# Patient Record
Sex: Male | Born: 1945 | Race: White | Hispanic: No | Marital: Married | Smoking: Former smoker
Health system: Southern US, Community
[De-identification: ages and names within clinical notes are randomized; demographics above are authoritative.]

## PROBLEM LIST (undated history)

## (undated) DIAGNOSIS — D7581 Myelofibrosis: Secondary | ICD-10-CM

## (undated) DIAGNOSIS — F32A Depression, unspecified: Secondary | ICD-10-CM

## (undated) DIAGNOSIS — J449 Chronic obstructive pulmonary disease, unspecified: Secondary | ICD-10-CM

## (undated) DIAGNOSIS — F329 Major depressive disorder, single episode, unspecified: Secondary | ICD-10-CM

## (undated) DIAGNOSIS — C439 Malignant melanoma of skin, unspecified: Secondary | ICD-10-CM

## (undated) DIAGNOSIS — E785 Hyperlipidemia, unspecified: Secondary | ICD-10-CM

## (undated) DIAGNOSIS — I82409 Acute embolism and thrombosis of unspecified deep veins of unspecified lower extremity: Secondary | ICD-10-CM

## (undated) DIAGNOSIS — F419 Anxiety disorder, unspecified: Secondary | ICD-10-CM

## (undated) DIAGNOSIS — F4310 Post-traumatic stress disorder, unspecified: Secondary | ICD-10-CM

## (undated) HISTORY — DX: Malignant melanoma of skin, unspecified: C43.9

## (undated) HISTORY — PX: HERNIA REPAIR: SHX51

## (undated) HISTORY — DX: Hyperlipidemia, unspecified: E78.5

## (undated) HISTORY — DX: Anxiety disorder, unspecified: F41.9

## (undated) HISTORY — PX: MELANOMA EXCISION: SHX5266

## (undated) HISTORY — DX: Depression, unspecified: F32.A

## (undated) HISTORY — DX: Major depressive disorder, single episode, unspecified: F32.9

---

## 1998-04-12 ENCOUNTER — Emergency Department (HOSPITAL_COMMUNITY): Admission: EM | Admit: 1998-04-12 | Discharge: 1998-04-12 | Payer: Self-pay

## 1999-10-14 ENCOUNTER — Encounter: Admission: RE | Admit: 1999-10-14 | Discharge: 1999-10-14 | Payer: Self-pay | Admitting: *Deleted

## 1999-10-14 ENCOUNTER — Encounter: Payer: Self-pay | Admitting: *Deleted

## 2005-09-06 ENCOUNTER — Encounter: Admission: RE | Admit: 2005-09-06 | Discharge: 2005-09-06 | Payer: Self-pay | Admitting: Orthopedic Surgery

## 2008-10-19 ENCOUNTER — Emergency Department (HOSPITAL_COMMUNITY): Admission: EM | Admit: 2008-10-19 | Discharge: 2008-10-19 | Payer: Self-pay | Admitting: Emergency Medicine

## 2009-08-01 ENCOUNTER — Ambulatory Visit (HOSPITAL_BASED_OUTPATIENT_CLINIC_OR_DEPARTMENT_OTHER): Admission: RE | Admit: 2009-08-01 | Discharge: 2009-08-01 | Payer: Self-pay | Admitting: Urology

## 2010-07-07 LAB — POCT HEMOGLOBIN-HEMACUE: Hemoglobin: 13.1 g/dL (ref 13.0–17.0)

## 2016-08-21 ENCOUNTER — Encounter (HOSPITAL_COMMUNITY): Payer: Self-pay

## 2016-08-21 ENCOUNTER — Emergency Department (HOSPITAL_COMMUNITY): Payer: Medicare PPO

## 2016-08-21 ENCOUNTER — Emergency Department (HOSPITAL_COMMUNITY)
Admission: EM | Admit: 2016-08-21 | Discharge: 2016-08-21 | Disposition: A | Discharge status: Home / Self Care | Payer: Medicare PPO | Attending: Emergency Medicine | Admitting: Emergency Medicine

## 2016-08-21 DIAGNOSIS — J441 Chronic obstructive pulmonary disease with (acute) exacerbation: Secondary | ICD-10-CM | POA: Diagnosis not present

## 2016-08-21 DIAGNOSIS — Z7982 Long term (current) use of aspirin: Secondary | ICD-10-CM | POA: Insufficient documentation

## 2016-08-21 DIAGNOSIS — Z79899 Other long term (current) drug therapy: Secondary | ICD-10-CM | POA: Insufficient documentation

## 2016-08-21 DIAGNOSIS — R0602 Shortness of breath: Secondary | ICD-10-CM | POA: Diagnosis present

## 2016-08-21 LAB — CBC WITH DIFFERENTIAL/PLATELET
BASOS PCT: 2 %
BLASTS: 0 %
Band Neutrophils: 3 %
Basophils Absolute: 0.4 10*3/uL — ABNORMAL HIGH (ref 0.0–0.1)
Eosinophils Absolute: 0.2 10*3/uL (ref 0.0–0.7)
Eosinophils Relative: 1 %
HCT: 51.4 % (ref 39.0–52.0)
HEMOGLOBIN: 16.1 g/dL (ref 13.0–17.0)
LYMPHS PCT: 18 %
Lymphs Abs: 3.3 10*3/uL (ref 0.7–4.0)
MCH: 28.8 pg (ref 26.0–34.0)
MCHC: 31.3 g/dL (ref 30.0–36.0)
MCV: 91.9 fL (ref 78.0–100.0)
MONO ABS: 0.4 10*3/uL (ref 0.1–1.0)
MYELOCYTES: 8 %
Metamyelocytes Relative: 3 %
Monocytes Relative: 2 %
NEUTROS PCT: 63 %
NRBC: 2 /100{WBCs} — AB
Neutro Abs: 13.9 10*3/uL — ABNORMAL HIGH (ref 1.7–7.7)
OTHER: 0 %
PLATELETS: 115 10*3/uL — AB (ref 150–400)
PROMYELOCYTES ABS: 0 %
RBC: 5.59 MIL/uL (ref 4.22–5.81)
RDW: 19.5 % — ABNORMAL HIGH (ref 11.5–15.5)
WBC: 18.2 10*3/uL — AB (ref 4.0–10.5)

## 2016-08-21 LAB — BASIC METABOLIC PANEL
Anion gap: 5 (ref 5–15)
BUN: 16 mg/dL (ref 6–20)
CHLORIDE: 108 mmol/L (ref 101–111)
CO2: 26 mmol/L (ref 22–32)
CREATININE: 0.92 mg/dL (ref 0.61–1.24)
Calcium: 9 mg/dL (ref 8.9–10.3)
Glucose, Bld: 105 mg/dL — ABNORMAL HIGH (ref 65–99)
Potassium: 4.7 mmol/L (ref 3.5–5.1)
SODIUM: 139 mmol/L (ref 135–145)

## 2016-08-21 MED ORDER — ALBUTEROL SULFATE HFA 108 (90 BASE) MCG/ACT IN AERS
INHALATION_SPRAY | RESPIRATORY_TRACT | Status: AC
  Filled 2016-08-21: qty 6.7

## 2016-08-21 MED ORDER — DOXYCYCLINE HYCLATE 100 MG PO CAPS: 100.0000 mg | ORAL_CAPSULE | Freq: Two times a day (BID) | ORAL | 0 refills | Status: DC

## 2016-08-21 MED ORDER — ALBUTEROL SULFATE (2.5 MG/3ML) 0.083% IN NEBU: 2.5000 mg | INHALATION_SOLUTION | Freq: Once | RESPIRATORY_TRACT | Status: DC

## 2016-08-21 MED ORDER — PREDNISONE 20 MG PO TABS
60.0000 mg | ORAL_TABLET | Freq: Once | ORAL | Status: AC
  Administered 2016-08-21: 60 mg via ORAL
  Filled 2016-08-21: qty 3

## 2016-08-21 MED ORDER — ALBUTEROL SULFATE HFA 108 (90 BASE) MCG/ACT IN AERS
2.0000 | INHALATION_SPRAY | Freq: Four times a day (QID) | RESPIRATORY_TRACT | Status: DC
Start: 2016-08-21 — End: 2016-08-22
  Administered 2016-08-21: 2 via RESPIRATORY_TRACT

## 2016-08-21 MED ORDER — ALBUTEROL SULFATE (2.5 MG/3ML) 0.083% IN NEBU
5.0000 mg | INHALATION_SOLUTION | Freq: Once | RESPIRATORY_TRACT | Status: AC
  Administered 2016-08-21: 5 mg via RESPIRATORY_TRACT
  Filled 2016-08-21: qty 6

## 2016-08-21 MED ORDER — IPRATROPIUM BROMIDE HFA 17 MCG/ACT IN AERS: 2.0000 | INHALATION_SPRAY | Freq: Two times a day (BID) | RESPIRATORY_TRACT | 12 refills | Status: DC

## 2016-08-21 MED ORDER — PREDNISONE 20 MG PO TABS: 20.0000 mg | ORAL_TABLET | Freq: Two times a day (BID) | ORAL | 0 refills | Status: DC

## 2016-08-21 MED ORDER — ALBUTEROL SULFATE HFA 108 (90 BASE) MCG/ACT IN AERS: 1.0000 | INHALATION_SPRAY | Freq: Four times a day (QID) | RESPIRATORY_TRACT | 0 refills | Status: DC | PRN

## 2016-08-21 NOTE — Discharge Instructions (Signed)
Stop smoking! Follow-up with your physician at the [place].

## 2016-08-21 NOTE — ED Provider Notes (Signed)
[place] DEPT Provider Note   CSN: [id number] Arrival date & time: 08/21/16  1927     History   Chief Complaint Chief Complaint  Patient presents with  . Shortness of Breath    HPI Carlos Vaughn is a 71 y.o. male. Chief complaint: Short of breath.  HPI: This is a 71 year old male with a greater than 70+ pack year smoking history. States he's never been told he had emphysema. States is been short of breath for the last 2 days. Patient working in the yard was up on the roof cleaning off pollen today. No chest pain. No leg swelling. Minimal cough. No sputum no fever.  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 He is following with hematology at the [facility] in [place]. No intervention is planned as yet:  "they're monitoring"  No past medical history on file.  There are no active problems to display for this patient.   History reviewed. No pertinent surgical history.     Home Medications    Prior to Admission medications   Medication Sig Start Date End Date Taking? Authorizing Provider  aspirin EC 81 MG tablet Take 81 mg by mouth daily.   Yes [provider]  Multiple Vitamin (MULTIVITAMIN WITH MINERALS) TABS tablet Take 1 tablet by mouth daily.   Yes [provider]  PRAZOSIN HCL PO Take 1 tablet by mouth at bedtime.   Yes [provider]  QUETIAPINE FUMARATE PO Take 1 tablet by mouth at bedtime.   Yes [provider]  simvastatin (ZOCOR) 40 MG tablet Take 40 mg by mouth at bedtime.   Yes [provider]  albuterol (PROVENTIL HFA;VENTOLIN HFA) 108 (90 Base) MCG/ACT inhaler Inhale 1-2 puffs into the lungs every 6 (six) hours as needed for wheezing. 08/21/16   Tanna Furry, MD  doxycycline (VIBRAMYCIN) 100 MG capsule Take 1 capsule (100 mg total) by mouth 2 (two) times daily. 08/21/16   Tanna Furry, MD  ipratropium (ATROVENT HFA) 17 MCG/ACT inhaler Inhale 2 puffs into the lungs 2 (two) times daily. 08/21/16   Tanna Furry, MD  predniSONE (DELTASONE) 20 MG tablet Take 1 tablet (20 mg total) by mouth 2 (two) times daily with a meal. 08/21/16   Tanna Furry, MD    Family History No family history on file.  Social History Social History  Substance Use Topics  . Smoking status: Not on file  . Smokeless tobacco: Not on file  . Alcohol use Not on file     Allergies   Patient has no known allergies.   Review of Systems Review of Systems  Constitutional: Negative for appetite change, chills, diaphoresis, fatigue and fever.  HENT: Negative for mouth sores, sore throat and trouble swallowing.   Eyes: Negative for visual disturbance.  Respiratory: Positive for cough, shortness of breath and wheezing. Negative for chest tightness.   Cardiovascular: Negative for chest pain.  Gastrointestinal: Negative for abdominal distention, abdominal pain, diarrhea, nausea and vomiting.  Endocrine: Negative for polydipsia, polyphagia and polyuria.  Genitourinary: Negative for dysuria, frequency and hematuria.  Musculoskeletal: Negative for gait problem.  Skin: Negative for color change, pallor and rash.  Neurological: Negative for dizziness, syncope, light-headedness and headaches.  Hematological: Does not bruise/bleed easily.  Psychiatric/Behavioral: Negative for behavioral problems and confusion.     Physical Exam Updated Vital Signs BP 115/68 (BP Location: Right Arm)   Pulse 72   Temp 97.9 F (36.6 C) (Oral)   Resp 18   Ht 5\' 8"  (  1.727 m)   Wt 143 lb (64.9 kg)   SpO2 91%   BMI 21.74 kg/m   Physical Exam  Constitutional: He is oriented to person, place, and time. He appears well-developed and well-nourished. No distress.  HENT:  Head: Normocephalic.  Eyes: Conjunctivae are normal. Pupils are equal, round, and reactive to light. No scleral icterus.  Neck: Normal range of motion. Neck supple. No thyromegaly present.  Cardiovascular: Normal rate and regular rhythm.  Exam reveals no gallop and no friction  rub.   No murmur heard. Not tachycardic  Pulmonary/Chest: Effort normal. No respiratory distress. He has wheezes. He has no rales.  Abdominal: Soft. Bowel sounds are normal. He exhibits no distension. There is no tenderness. There is no rebound.  Musculoskeletal: Normal range of motion.  No LE edema or cording.  Neurological: He is alert and oriented to person, place, and time.  Skin: Skin is warm and dry. No rash noted.  Psychiatric: He has a normal mood and affect. His behavior is normal.     ED Treatments / Results  Labs (all labs ordered are listed, but only abnormal results are displayed) Labs Reviewed  CBC WITH DIFFERENTIAL/PLATELET - Abnormal; Notable for the following:       Result Value   WBC 18.2 (*)    RDW 19.5 (*)    Platelets 115 (*)    All other components within normal limits  BASIC METABOLIC PANEL - Abnormal; Notable for the following:    Glucose, Bld 105 (*)    All other components within normal limits    EKG  EKG Interpretation  Date/Time:  Saturday Aug 21 2016 19:34:11 EDT Ventricular Rate:  80 PR Interval:    QRS Duration: 89 QT Interval:  376 QTC Calculation: 434 R Axis:   65 Text Interpretation:  Sinus rhythm Probable left atrial enlargement Borderline low voltage, extremity leads Probable anteroseptal infarct, old Confirmed by Jeneen Rinks  MD, [place] ([id number]) on 08/21/2016 7:39:21 PM       Radiology Dg Chest 2 View  Result Date: 08/21/2016 CLINICAL DATA:  Shortness of breath EXAM: CHEST  2 VIEW COMPARISON:  Report 10/14/1999 FINDINGS: No acute infiltrate or effusion. There is hyperinflation. Normal heart size. No pneumothorax. There is mild kyphosis of the spine with mild wedging of mid thoracic vertebra. IMPRESSION: Hyperinflation.  No acute infiltrate or edema. Electronically Signed   By: Donavan Foil M.D.   On: 08/21/2016 19:55    Procedures Procedures (including critical care time)  Medications Ordered in ED Medications  albuterol (PROVENTIL) (2.5  MG/3ML) 0.083% nebulizer solution 2.5 mg (2.5 mg Nebulization Not Given 08/21/16 2040)  albuterol (PROVENTIL HFA;VENTOLIN HFA) 108 (90 Base) MCG/ACT inhaler 2 puff (not administered)  albuterol (PROVENTIL) (2.5 MG/3ML) 0.083% nebulizer solution 5 mg (5 mg Nebulization Given 08/21/16 2036)  predniSONE (DELTASONE) tablet 60 mg (60 mg Oral Given 08/21/16 2015)     Initial Impression / Assessment and Plan / ED Course  I have reviewed the triage vital signs and the nursing notes.  Pertinent labs & imaging results that were available during my care of the patient were reviewed by me and considered in my medical decision making (see chart for details).     Chest x-ray shows some COPD changes. No infiltrate thyromegaly or effusions. Nebulizer albuterol by mouth prednisone. His history of myelofibrosis we'll check blood count and kidney function. He is 96% on room air. He is not tachycardic. He does not have any other abnormalities suggestive of Pulmonary embolus. Clinically  and radiographically no pneumonia. Likely bronchospasm from undiagnosed illness COPD. Lab evaluation. Breathing treatment. Prednisone. Reevaluation.  Final Clinical Impressions(s) / ED Diagnoses   Final diagnoses:  COPD exacerbation ([place])   Patient feeling improved. Not anemic. Has leukocytosis. Differential not available the patient rather insistent that he must leave before results. Given prescription for up-year-old, Atrovent, prednisone, and doxycycline. .[facility] Sm[facility]   New Prescriptions New Prescriptions   ALBUTEROL (PROVENTIL HFA;VENTOLIN HFA) 108 (90 BASE) MCG/ACT INHALER    Inhale 1-2 puffs into the lungs every 6 (six) hours as needed for wheezing.   DOXYCYCLINE (VIBRAMYCIN) 100 MG CAPSULE    Take 1 capsule (100 mg total) by mouth 2 (two) times daily.   IPRATROPIUM (ATROVENT HFA) 17 MCG/ACT INHALER    Inhale 2 puffs into the lungs 2 (two) times daily.   PREDNISONE (DELTASONE) 20 MG TABLET    Take 1  tablet (20 mg total) by mouth 2 (two) times daily with a meal.     Tanna Furry, MD 08/21/16 2245

## 2016-08-21 NOTE — ED Notes (Signed)
Respiratory called for breathing treatment.

## 2016-08-21 NOTE — ED Notes (Signed)
Patient transported to X-ray 

## 2016-08-21 NOTE — ED Notes (Addendum)
Pt c/o shortness of breath since a week ago. Denies chest pain. Has not used any medicine for it. 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

## 2016-10-10 ENCOUNTER — Encounter (HOSPITAL_COMMUNITY): Payer: Self-pay | Admitting: Emergency Medicine

## 2016-10-10 ENCOUNTER — Emergency Department (HOSPITAL_COMMUNITY)
Admission: EM | Admit: 2016-10-10 | Discharge: 2016-10-10 | Disposition: A | Discharge status: Home / Self Care | Payer: Medicare PPO | Attending: Emergency Medicine | Admitting: Emergency Medicine

## 2016-10-10 ENCOUNTER — Emergency Department (HOSPITAL_COMMUNITY): Payer: Medicare PPO

## 2016-10-10 DIAGNOSIS — Z87891 Personal history of nicotine dependence: Secondary | ICD-10-CM | POA: Insufficient documentation

## 2016-10-10 DIAGNOSIS — J181 Lobar pneumonia, unspecified organism: Secondary | ICD-10-CM | POA: Insufficient documentation

## 2016-10-10 DIAGNOSIS — R05 Cough: Secondary | ICD-10-CM | POA: Diagnosis present

## 2016-10-10 DIAGNOSIS — J441 Chronic obstructive pulmonary disease with (acute) exacerbation: Secondary | ICD-10-CM | POA: Insufficient documentation

## 2016-10-10 DIAGNOSIS — Z7982 Long term (current) use of aspirin: Secondary | ICD-10-CM | POA: Diagnosis not present

## 2016-10-10 DIAGNOSIS — J189 Pneumonia, unspecified organism: Secondary | ICD-10-CM

## 2016-10-10 HISTORY — DX: Myelofibrosis: D75.81

## 2016-10-10 HISTORY — DX: Post-traumatic stress disorder, unspecified: F43.10

## 2016-10-10 HISTORY — DX: Chronic obstructive pulmonary disease, unspecified: J44.9

## 2016-10-10 LAB — CBC WITH DIFFERENTIAL/PLATELET
BASOS ABS: 0.2 10*3/uL — AB (ref 0.0–0.1)
Basophils Relative: 1 %
Eosinophils Absolute: 0 10*3/uL (ref 0.0–0.7)
Eosinophils Relative: 0 %
HCT: 45.5 % (ref 39.0–52.0)
Hemoglobin: 14.4 g/dL (ref 13.0–17.0)
LYMPHS ABS: 1.2 10*3/uL (ref 0.7–4.0)
Lymphocytes Relative: 5 %
MCH: 28.2 pg (ref 26.0–34.0)
MCHC: 31.6 g/dL (ref 30.0–36.0)
MCV: 89 fL (ref 78.0–100.0)
MONO ABS: 0.5 10*3/uL (ref 0.1–1.0)
Monocytes Relative: 2 %
Neutro Abs: 22.2 10*3/uL — ABNORMAL HIGH (ref 1.7–7.7)
Neutrophils Relative %: 92 %
Platelets: 211 10*3/uL (ref 150–400)
RBC: 5.11 MIL/uL (ref 4.22–5.81)
RDW: 18.9 % — AB (ref 11.5–15.5)
WBC Morphology: INCREASED
WBC: 24.1 10*3/uL — AB (ref 4.0–10.5)

## 2016-10-10 LAB — BASIC METABOLIC PANEL
Anion gap: 8 (ref 5–15)
BUN: 14 mg/dL (ref 6–20)
CALCIUM: 8.7 mg/dL — AB (ref 8.9–10.3)
CO2: 27 mmol/L (ref 22–32)
CREATININE: 0.95 mg/dL (ref 0.61–1.24)
Chloride: 101 mmol/L (ref 101–111)
GFR calc non Af Amer: >60 mL/min (ref >60)
Glucose, Bld: 99 mg/dL (ref 65–99)
Potassium: 4.4 mmol/L (ref 3.5–5.1)
SODIUM: 136 mmol/L (ref 135–145)

## 2016-10-10 MED ORDER — PREDNISONE 20 MG PO TABS
60.0000 mg | ORAL_TABLET | Freq: Once | ORAL | Status: AC
  Administered 2016-10-10: 60 mg via ORAL
  Filled 2016-10-10: qty 3

## 2016-10-10 MED ORDER — CEFPODOXIME PROXETIL 200 MG PO TABS
200.0000 mg | ORAL_TABLET | Freq: Once | ORAL | Status: AC
  Administered 2016-10-10: 200 mg via ORAL
  Filled 2016-10-10: qty 1

## 2016-10-10 MED ORDER — CEFPODOXIME PROXETIL 100 MG PO TABS: 100.0000 mg | ORAL_TABLET | Freq: Two times a day (BID) | ORAL | 0 refills | Status: AC

## 2016-10-10 MED ORDER — PREDNISONE 20 MG PO TABS: 40.0000 mg | ORAL_TABLET | Freq: Every day | ORAL | 0 refills | Status: AC

## 2016-10-10 MED ORDER — LEVOFLOXACIN 750 MG PO TABS
750.0000 mg | ORAL_TABLET | Freq: Once | ORAL | Status: DC
  Filled 2016-10-10: qty 1

## 2016-10-10 MED ORDER — HYDROCODONE-HOMATROPINE 5-1.5 MG/5ML PO SYRP
5.0000 mL | ORAL_SOLUTION | Freq: Four times a day (QID) | ORAL | 0 refills | Status: DC | PRN
Start: 2016-10-10 — End: 2017-09-05

## 2016-10-10 MED ORDER — DOXYCYCLINE HYCLATE 100 MG PO CAPS: 100.0000 mg | ORAL_CAPSULE | Freq: Two times a day (BID) | ORAL | 0 refills | Status: DC

## 2016-10-10 MED ORDER — HYDROCODONE-HOMATROPINE 5-1.5 MG/5ML PO SYRP
5.0000 mL | ORAL_SOLUTION | Freq: Once | ORAL | Status: AC
  Administered 2016-10-10: 5 mL via ORAL
  Filled 2016-10-10: qty 5

## 2016-10-10 MED ORDER — DOXYCYCLINE HYCLATE 100 MG PO TABS
100.0000 mg | ORAL_TABLET | Freq: Once | ORAL | Status: AC
  Administered 2016-10-10: 100 mg via ORAL
  Filled 2016-10-10: qty 1

## 2016-10-10 MED ORDER — IPRATROPIUM-ALBUTEROL 0.5-2.5 (3) MG/3ML IN SOLN
3.0000 mL | Freq: Once | RESPIRATORY_TRACT | Status: AC
  Administered 2016-10-10: 3 mL via RESPIRATORY_TRACT
  Filled 2016-10-10: qty 3

## 2016-10-10 NOTE — Discharge Instructions (Signed)
-   Start taking your antibiotics tomorrow morning - Drink at least 8 glasses of water (8 oz/glass) daily to stay hydrated - Use your ALBUTEROL inhaler four times a day for the next 2-3 days, then as needed for wheezing - Follow-up with your doctor in 1-2 weeks for repeat XRay and Exam

## 2016-10-10 NOTE — ED Notes (Signed)
Spouse will be driving home

## 2016-10-10 NOTE — ED Triage Notes (Addendum)
Pt reports a dry cough that is worse at night accompanied by night sweats and chills for the past week.  Diagnosed with COPD a month ago.

## 2016-10-10 NOTE — ED Provider Notes (Signed)
[place] [place] DEPT Provider Note   CSN: [id number] Arrival date & time: 10/10/16  1804     History   Chief Complaint Chief Complaint  Patient presents with  . Cough    HPI Carlos Vaughn is a 71 y.o. male.  HPI   71 yo M with h/o myelofibrosis, COPD here with cough, chills. Pt states that for the past week he has had progressively worsening cough, chills, and night sweats. He has had difficulty sleeping due to coughing and feeling hot chills. His appetite has also been poor. He states that he has also noticed mild SOB during the day. No chest pain. His cough has been dry. No known sick contacts. Of note, he did have a recent cold and was diagnosed with COPD 1.5 months ago.   Past Medical History:  Diagnosis Date  . COPD (chronic obstructive pulmonary disease) ([place])   . Myelofibrosis ([place])   . PTSD (post-traumatic stress disorder)     There are no active problems to display for this patient.   History reviewed. No pertinent surgical history.     Home Medications    Prior to Admission medications   Medication Sig Start Date End Date Taking? Authorizing Provider  albuterol (PROVENTIL HFA;VENTOLIN HFA) 108 (90 Base) MCG/ACT inhaler Inhale 1-2 puffs into the lungs every 6 (six) hours as needed for wheezing. 08/21/16  Yes Tanna Furry, MD  aspirin EC 81 MG tablet Take 81 mg by mouth daily.   Yes [provider]  ipratropium (ATROVENT HFA) 17 MCG/ACT inhaler Inhale 2 puffs into the lungs 2 (two) times daily. 08/21/16  Yes Tanna Furry, MD  Multiple Vitamin (MULTIVITAMIN WITH MINERALS) TABS tablet Take 1 tablet by mouth daily.   Yes [provider]  PRAZOSIN HCL PO Take 1 tablet by mouth at bedtime.   Yes [provider]  QUETIAPINE FUMARATE PO Take 1 tablet by mouth at bedtime.   Yes [provider]  simvastatin (ZOCOR) 40 MG tablet Take 40 mg by mouth at bedtime.   Yes [provider]  cefpodoxime (VANTIN) 100 MG tablet Take 1 tablet  (100 mg total) by mouth 2 (two) times daily. 10/10/16 10/20/16  Duffy Bruce, MD  doxycycline (VIBRAMYCIN) 100 MG capsule Take 1 capsule (100 mg total) by mouth 2 (two) times daily. 10/10/16   Duffy Bruce, MD  HYDROcodone-homatropine [facility]) 5-1.5 MG/5ML syrup Take 5 mLs by mouth every 6 (six) hours as needed for cough. 10/10/16   Duffy Bruce, MD  predniSONE (DELTASONE) 20 MG tablet Take 2 tablets (40 mg total) by mouth daily. 10/10/16 10/15/16  Duffy Bruce, MD    Family History History reviewed. No pertinent family history.  Social History Social History  Substance Use Topics  . Smoking status: Former Research scientist (life sciences)  . Smokeless tobacco: Never Used  . Alcohol use No     Allergies   Patient has no known allergies.   Review of Systems Review of Systems  Constitutional: Positive for chills and fatigue. Negative for fever.  HENT: Negative for congestion and rhinorrhea.   Eyes: Negative for visual disturbance.  Respiratory: Positive for cough and shortness of breath. Negative for wheezing.   Cardiovascular: Negative for chest pain and leg swelling.  Gastrointestinal: Negative for abdominal pain, diarrhea, nausea and vomiting.  Genitourinary: Negative for dysuria and flank pain.  Musculoskeletal: Negative for neck pain and neck stiffness.  Skin: Negative for rash and wound.  Allergic/Immunologic: Negative for immunocompromised state.  Neurological: Negative for syncope, weakness and headaches.  All other systems reviewed and are negative.    Physical Exam Updated Vital Signs BP 116/78   Pulse 72   Temp 97.9 F (36.6 C) (Oral)   Resp 18   SpO2 99%   Physical Exam  Constitutional: He is oriented to person, place, and time. He appears well-developed and well-nourished. No distress.  HENT:  Head: Normocephalic and atraumatic.  Eyes: Conjunctivae are normal.  Neck: Neck supple.  Cardiovascular: Normal rate, regular rhythm and normal heart sounds.  Exam reveals no  friction rub.   No murmur heard. Pulmonary/Chest: Effort normal. No respiratory distress. He has decreased breath sounds. He has wheezes (mild, end-expiratory). He has rhonchi in the right middle field and the right lower field. He has no rales.  Abdominal: He exhibits no distension.  Musculoskeletal: He exhibits no edema.  Neurological: He is alert and oriented to person, place, and time. He exhibits normal muscle tone.  Skin: Skin is warm. Capillary refill takes less than 2 seconds.  Psychiatric: He has a normal mood and affect.  Nursing note and vitals reviewed.    ED Treatments / Results  Labs (all labs ordered are listed, but only abnormal results are displayed) Labs Reviewed  CBC WITH DIFFERENTIAL/PLATELET - Abnormal; Notable for the following:       Result Value   WBC 24.1 (*)    RDW 18.9 (*)    Neutro Abs 22.2 (*)    Basophils Absolute 0.2 (*)    All other components within normal limits  BASIC METABOLIC PANEL - Abnormal; Notable for the following:    Calcium 8.7 (*)    All other components within normal limits    EKG  EKG Interpretation None       Radiology Dg Chest 2 View  Result Date: 10/10/2016 CLINICAL DATA:  Cough for 1 week EXAM: CHEST  2 VIEW COMPARISON:  08/21/2016 FINDINGS: Interim development of a focal slightly masslike opacity in the right middle lobe. Hyperinflation. No pleural effusion. Eventration of the right diaphragm. Normal heart size. No pneumothorax. Stable mild wedge deformities of the mid to lower thoracic spine. IMPRESSION: Development of focal opacity in the right mid lung, and probably a pneumonia given time course, however radiographic follow-up to resolution recommended to exclude a mass. Electronically Signed   By: Donavan Foil M.D.   On: 10/10/2016 20:11    Procedures Procedures (including critical care time)  Medications Ordered in ED Medications  ipratropium-albuterol (DUONEB) 0.5-2.5 (3) MG/3ML nebulizer solution 3 mL (3 mLs  Nebulization Given 10/10/16 2032)  predniSONE (DELTASONE) tablet 60 mg (60 mg Oral Given 10/10/16 2032)  cefpodoxime (VANTIN) tablet 200 mg (200 mg Oral Given 10/10/16 2101)  doxycycline (VIBRA-TABS) tablet 100 mg (100 mg Oral Given 10/10/16 2048)  HYDROcodone-homatropine (HYCODAN) 5-1.5 MG/5ML syrup 5 mL (5 mLs Oral Given 10/10/16 2101)     Initial Impression / Assessment and Plan / ED Course  I have reviewed the triage vital signs and the nursing notes.  Pertinent labs & imaging results that were available during my care of the patient were reviewed by me and considered in my medical decision making (see chart for details).    71 yo M with h/o myelofbrosis here with cough, night sweats, and chills for one week. CXR shows focal right mid lung pneumonia. No recent hospitalizations. Pt noted to have significant leukocytosis with bandemia but he has a h/o the same per review of records. He has NO tachycardia, hypoxia, tachypnea, normal AG (making lactic acidosis unlikely), and  is o/w clinically veyr well appearing, ambulating without difficulty, and requesting to go home. CURB65 1 for age only.   Given pt's well appearance, and his request to go home, feel its reasonable to treat with ABX and outpt follow-up. Pt is on seroquel so will avoid levaquin due to QT issues; will place on Vantin/Doxy. He states he is not currently on chemo, and is treated as outpt for his infections. I advised him to f/u with his PCP in the next several days and gave strict return precautions.  Based on labs, vitals, and presentation, the patient/patient's family and I discussed the risks and benefits of outpatient versus inpatient management. We discussed specific risks including worsening PNA, sepsis, shock, death. Based on an informed, shared decision making discussion, the patient/family elect to return home. Encouraged to return immediately with any worsening symptoms or desire for inpatient monitoring.   Final Clinical  Impressions(s) / ED Diagnoses   Final diagnoses:  Community acquired pneumonia of right middle lobe of lung ([place])  COPD exacerbation ([place])    New Prescriptions Discharge Medication List as of 10/10/2016  8:52 PM    START taking these medications   Details  cefpodoxime (VANTIN) 100 MG tablet Take 1 tablet (100 mg total) by mouth 2 (two) times daily., Starting Sun 10/10/2016, Until Wed 10/20/2016, Print         Duffy Bruce, MD 10/10/16 [phone]

## 2017-02-24 ENCOUNTER — Encounter (HOSPITAL_COMMUNITY): Payer: Self-pay | Admitting: *Deleted

## 2017-02-24 ENCOUNTER — Emergency Department (HOSPITAL_COMMUNITY)
Admission: EM | Admit: 2017-02-24 | Discharge: 2017-02-24 | Disposition: A | Discharge status: Home / Self Care | Payer: Medicare PPO | Attending: Emergency Medicine | Admitting: Emergency Medicine

## 2017-02-24 ENCOUNTER — Emergency Department (HOSPITAL_BASED_OUTPATIENT_CLINIC_OR_DEPARTMENT_OTHER): Admit: 2017-02-24 | Discharge: 2017-02-24 | Disposition: A | Discharge status: Home / Self Care | Payer: Medicare PPO

## 2017-02-24 DIAGNOSIS — M7989 Other specified soft tissue disorders: Secondary | ICD-10-CM | POA: Diagnosis not present

## 2017-02-24 DIAGNOSIS — R2241 Localized swelling, mass and lump, right lower limb: Secondary | ICD-10-CM | POA: Diagnosis present

## 2017-02-24 DIAGNOSIS — Z7982 Long term (current) use of aspirin: Secondary | ICD-10-CM | POA: Insufficient documentation

## 2017-02-24 DIAGNOSIS — Z79899 Other long term (current) drug therapy: Secondary | ICD-10-CM | POA: Insufficient documentation

## 2017-02-24 DIAGNOSIS — I82431 Acute embolism and thrombosis of right popliteal vein: Secondary | ICD-10-CM | POA: Insufficient documentation

## 2017-02-24 DIAGNOSIS — M79601 Pain in right arm: Secondary | ICD-10-CM

## 2017-02-24 DIAGNOSIS — J449 Chronic obstructive pulmonary disease, unspecified: Secondary | ICD-10-CM | POA: Diagnosis not present

## 2017-02-24 DIAGNOSIS — Z87891 Personal history of nicotine dependence: Secondary | ICD-10-CM | POA: Insufficient documentation

## 2017-02-24 DIAGNOSIS — I82401 Acute embolism and thrombosis of unspecified deep veins of right lower extremity: Secondary | ICD-10-CM | POA: Diagnosis not present

## 2017-02-24 LAB — BASIC METABOLIC PANEL
Anion gap: 10 (ref 5–15)
BUN: 20 mg/dL (ref 6–20)
CO2: 25 mmol/L (ref 22–32)
CREATININE: 1.18 mg/dL (ref 0.61–1.24)
Calcium: 9.2 mg/dL (ref 8.9–10.3)
Chloride: 105 mmol/L (ref 101–111)
GFR calc Af Amer: >60 mL/min (ref >60)
GFR, EST NON AFRICAN AMERICAN: 59 mL/min — AB (ref >60)
GLUCOSE: 84 mg/dL (ref 65–99)
POTASSIUM: 4.7 mmol/L (ref 3.5–5.1)
SODIUM: 140 mmol/L (ref 135–145)

## 2017-02-24 LAB — CBC WITH DIFFERENTIAL/PLATELET
Basophils Absolute: 0 10*3/uL (ref 0.0–0.1)
Basophils Relative: 0 %
EOS PCT: 0 %
Eosinophils Absolute: 0 10*3/uL (ref 0.0–0.7)
HCT: 61.6 % — ABNORMAL HIGH (ref 39.0–52.0)
HEMOGLOBIN: 19.7 g/dL — AB (ref 13.0–17.0)
Lymphocytes Relative: 11 %
Lymphs Abs: 1.9 10*3/uL (ref 0.7–4.0)
MCH: 28.1 pg (ref 26.0–34.0)
MCHC: 32 g/dL (ref 30.0–36.0)
MCV: 87.7 fL (ref 78.0–100.0)
Monocytes Absolute: 0.4 10*3/uL (ref 0.1–1.0)
Monocytes Relative: 2 %
Myelocytes: 6 %
NRBC: 2 /100{WBCs} — AB
Neutro Abs: 15.2 10*3/uL — ABNORMAL HIGH (ref 1.7–7.7)
Neutrophils Relative %: 80 %
PROMYELOCYTES ABS: 1 %
Platelets: 71 10*3/uL — ABNORMAL LOW (ref 150–400)
RBC: 7.02 MIL/uL — AB (ref 4.22–5.81)
RDW: 18.3 % — ABNORMAL HIGH (ref 11.5–15.5)
WBC: 17.5 10*3/uL — AB (ref 4.0–10.5)

## 2017-02-24 MED ORDER — ENOXAPARIN SODIUM 80 MG/0.8ML ~~LOC~~ SOLN: 1.0000 mg/kg | Freq: Two times a day (BID) | SUBCUTANEOUS | 0 refills | Status: DC

## 2017-02-24 MED ORDER — ENOXAPARIN SODIUM 100 MG/ML ~~LOC~~ SOLN
100.0000 mg | Freq: Once | SUBCUTANEOUS | Status: AC
  Administered 2017-02-24: 100 mg via SUBCUTANEOUS
  Filled 2017-02-24: qty 1

## 2017-02-24 MED ORDER — ENOXAPARIN (LOVENOX) PATIENT EDUCATION KIT
PACK | Freq: Once | Status: AC
  Administered 2017-02-24: 15:00:00
  Filled 2017-02-24: qty 1

## 2017-02-24 MED ORDER — ENOXAPARIN SODIUM 120 MG/0.8ML ~~LOC~~ SOLN
1.5000 mg/kg | Freq: Once | SUBCUTANEOUS | Status: DC
Start: 2017-02-24 — End: 2017-02-24
  Filled 2017-02-24: qty 0.69

## 2017-02-24 MED ORDER — ENOXAPARIN SODIUM 80 MG/0.8ML ~~LOC~~ SOLN
1.0000 mg/kg | Freq: Once | SUBCUTANEOUS | Status: DC
  Filled 2017-02-24: qty 0.7

## 2017-02-24 NOTE — Consult Note (Signed)
Hospital Consult    Reason for Consult:  Extensive rle dvt Referring Physician:  ED at [facility] MRN #:  [id number]  History of Present Illness: This is a 71 y.o. male with history of myelofibrosis. Never history of dvt not on blood thinners. 5 day history of right leg swelling. No recent operations or travel. No family history of clotting. Followed by hematology at [facility]. senstaion and motor in tact.   Past Medical History:  Diagnosis Date  . COPD (chronic obstructive pulmonary disease) ([place])   . Myelofibrosis ([place])   . PTSD (post-traumatic stress disorder)     History reviewed. No pertinent surgical history. PMH: myelofibrosis No Known Allergies  Prior to Admission medications   Medication Sig Start Date End Date Taking? Authorizing Provider  aspirin EC 81 MG tablet Take 81 mg by mouth daily.   Yes [provider]  ibuprofen (ADVIL,MOTRIN) 200 MG tablet Take 600 mg every 6 (six) hours as needed by mouth for moderate pain.   Yes [provider]  Multiple Vitamin (MULTIVITAMIN WITH MINERALS) TABS tablet Take 1 tablet by mouth daily.   Yes [provider]  prazosin (MINIPRESS) 2 MG capsule Take 1 mg at bedtime by mouth.    Yes [provider]  QUEtiapine (SEROQUEL) 100 MG tablet Take 100-200 tablets at bedtime by mouth.    Yes [provider]  simvastatin (ZOCOR) 40 MG tablet Take 40 mg by mouth at bedtime.   Yes [provider]  albuterol (PROVENTIL HFA;VENTOLIN HFA) 108 (90 Base) MCG/ACT inhaler Inhale 1-2 puffs into the lungs every 6 (six) hours as needed for wheezing. Patient not taking: Reported on 02/24/2017 08/21/16   Tanna Furry, MD  doxycycline (VIBRAMYCIN) 100 MG capsule Take 1 capsule (100 mg total) by mouth 2 (two) times daily. Patient not taking: Reported on 02/24/2017 10/10/16   Duffy Bruce, MD  HYDROcodone-homatropine [facility]) 5-1.5 MG/5ML syrup Take 5 mLs by mouth every 6 (six) hours as needed for cough. Patient  not taking: Reported on 02/24/2017 10/10/16   Duffy Bruce, MD  ipratropium (ATROVENT HFA) 17 MCG/ACT inhaler Inhale 2 puffs into the lungs 2 (two) times daily. Patient not taking: Reported on 02/24/2017 08/21/16   Tanna Furry, MD    Social History   Socioeconomic History  . Marital status: Married    Spouse name: Not on file  . Number of children: Not on file  . Years of education: Not on file  . Highest education level: Not on file  Social Needs  . Financial resource strain: Not on file  . Food insecurity - worry: Not on file  . Food insecurity - inability: Not on file  . Transportation needs - medical: Not on file  . Transportation needs - non-medical: Not on file  Occupational History  . Not on file  Tobacco Use  . Smoking status: Former Research scientist (life sciences)  . Smokeless tobacco: Never Used  Substance and Sexual Activity  . Alcohol use: No  . Drug use: Not on file  . Sexual activity: Not on file  Other Topics Concern  . Not on file  Social History Narrative  . Not on file     No family history on file.  REVIEW OF SYSTEMS (negative unless checked):   Cardiac:  []  Chest pain or chest pressure? []  Shortness of breath upon activity? []  Shortness of breath when lying flat? []  Irregular heart rhythm?  Vascular:  []  Pain in calf, thigh, or hip brought on by walking? []  Pain in  feet at night that wakes you up from your sleep? [x]  Blood clot in your veins? [x]  Leg swelling?  Pulmonary:  []  Oxygen at home? []  Productive cough? []  Wheezing?  Neurologic:  []  Sudden weakness in arms or legs? []  Sudden numbness in arms or legs? []  Sudden onset of difficult speaking or slurred speech? []  Temporary loss of vision in one eye? []  Problems with dizziness?  Gastrointestinal:  []  Blood in stool? []  Vomited blood?  Genitourinary:  []  Burning when urinating? []  Blood in urine?  Psychiatric:  []  Major depression  Hematologic:  []  Bleeding problems? []  Problems with blood  clotting?  Dermatologic:  []  Rashes or ulcers?  Constitutional:  []  Fever or chills?  Ear/Nose/Throat:  []  Change in hearing? []  Nose bleeds? []  Sore throat?  Musculoskeletal:  []  Back pain? []  Joint pain? []  Muscle pain?    Physical Examination  Vitals:   02/24/17 0941 02/24/17 1213  BP: 136/77 (!) 133/96  Pulse: 81 77  Resp: 12 18  Temp: 97.8 F (36.6 C)   SpO2: 98% 97%   Body mass index is 23.16 kg/m.  General:  WDWN in NAD HENT: WNL, normocephalic Pulmonary: normal non-labored breathing, without Rales, rhonchi,  wheezing Cardiac: palpable pedal pulses Abdomen: soft, he is distended but non tender, no fluid wave, reducible umbilical hernia Extremities: right lower leg edema is non pitting Musculoskeletal: no muscle wasting or atrophy  Neurologic: A&O X 3; SENSATION: normal; MOTOR FUNCTION:  moving all extremities equally. Speech is fluent/normal Psychiatric:  Appropriate mood and affect  CBC    Component Value Date/Time   WBC 17.5 (H) 02/24/2017 1017   RBC 7.02 (H) 02/24/2017 1017   HGB 19.7 (H) 02/24/2017 1017   HCT 61.6 (H) 02/24/2017 1017   PLT 71 (L) 02/24/2017 1017   MCV 87.7 02/24/2017 1017   MCH 28.1 02/24/2017 1017   MCHC 32.0 02/24/2017 1017   RDW 18.3 (H) 02/24/2017 1017   LYMPHSABS 1.9 02/24/2017 1017   MONOABS 0.4 02/24/2017 1017   EOSABS 0.0 02/24/2017 1017   BASOSABS 0.0 02/24/2017 1017    BMET    Component Value Date/Time   NA 140 02/24/2017 1017   K 4.7 02/24/2017 1017   CL 105 02/24/2017 1017   CO2 25 02/24/2017 1017   GLUCOSE 84 02/24/2017 1017   BUN 20 02/24/2017 1017   CREATININE 1.18 02/24/2017 1017   CALCIUM 9.2 02/24/2017 1017   GFRNONAA 59 (L) 02/24/2017 1017   GFRAA >60 02/24/2017 1017    COAGS: No results found for: INR, PROTIME   Non-Invasive Vascular Imaging:   Final Interpretation Right  Deep vein thrombosis involving the right femoral, popliteal, posterior tibial, peroneal, and gastrocnemius  veins.    ASSESSMENT/PLAN: This is a 71 y.o. male with extensive rle dvt. Swelling localized to lower leg on right. US demonstrates dvt from femoral vein down without involvement of common femoral vein. Abdominal vasculature would be difficult to evaluate given abdominal girth. I have discussed the need for blood thinners at least 3 months but considered extended treatment. He has a hematologist and will contact them for further guidance. Recommend ambulation and compression stockings once anticoagulation initiated. Will set f/u in 2-3 weeks to evaluate progression of symptoms.   Brandon C. Donzetta Matters, MD [facility] Office: [phone] Pager: [phone]

## 2017-02-24 NOTE — ED Notes (Addendum)
MD(Isaacs) made aware of positive ultrasound result for DVT.

## 2017-02-24 NOTE — ED Triage Notes (Signed)
Pt complains of pain and swelling to his right lower leg for the past few days. Pt is concerned he blood clot in leg.

## 2017-02-24 NOTE — Progress Notes (Signed)
RLE venous duplex prelim: DVT noted throughout rt leg, appears mobile thrombus at rt femoral vein. Landry Mellow, RDMS, RVT

## 2017-02-24 NOTE — ED Provider Notes (Signed)
[place] DEPT Provider Note   CSN: [id number] Arrival date & time: 02/24/17  0915     History   Chief Complaint Chief Complaint  Patient presents with  . Leg Pain    right lower    HPI Carlos Vaughn is a 71 y.o. male with past medical history of myelofibrosis, COPD, presenting to the ED with persistently worsening pain and swelling to the right lower extremity since Monday.  Patient states pain is located in his right calf that is worse with walking and palpation.  No recent injury.  No wounds.  Denies history of DVT, prolonged travel, cancer, recent trauma or surgery, fever/chills, chest pain or shortness of breath.  Does not take anticoagulation.  States with his myelofibrosis, he currently has elevated red blood cells, which he is currently receiving active care for by a hematologist at the [facility].  The history is provided by the patient.    Past Medical History:  Diagnosis Date  . COPD (chronic obstructive pulmonary disease) ([place])   . Myelofibrosis ([place])   . PTSD (post-traumatic stress disorder)     There are no active problems to display for this patient.   History reviewed. No pertinent surgical history.     Home Medications    Prior to Admission medications   Medication Sig Start Date End Date Taking? Authorizing Provider  aspirin EC 81 MG tablet Take 81 mg by mouth daily.   Yes [provider]  ibuprofen (ADVIL,MOTRIN) 200 MG tablet Take 600 mg every 6 (six) hours as needed by mouth for moderate pain.   Yes [provider]  Multiple Vitamin (MULTIVITAMIN WITH MINERALS) TABS tablet Take 1 tablet by mouth daily.   Yes [provider]  prazosin (MINIPRESS) 2 MG capsule Take 1 mg at bedtime by mouth.    Yes [provider]  QUEtiapine (SEROQUEL) 100 MG tablet Take 100-200 tablets at bedtime by mouth.    Yes [provider]  simvastatin (ZOCOR) 40 MG tablet Take 40 mg by mouth at bedtime.   Yes  [provider]  albuterol (PROVENTIL HFA;VENTOLIN HFA) 108 (90 Base) MCG/ACT inhaler Inhale 1-2 puffs into the lungs every 6 (six) hours as needed for wheezing. Patient not taking: Reported on 02/24/2017 08/21/16   Tanna Furry, MD  doxycycline (VIBRAMYCIN) 100 MG capsule Take 1 capsule (100 mg total) by mouth 2 (two) times daily. Patient not taking: Reported on 02/24/2017 10/10/16   Duffy Bruce, MD  enoxaparin (LOVENOX) 80 MG/0.8ML injection Inject 0.7 mLs (70 mg total) every 12 (twelve) hours for 9 days into the skin. 02/24/17 03/05/17  Shabria Egley, [place] N, PA-C  HYDROcodone-homatropine (HYCODAN) 5-1.5 MG/5ML syrup Take 5 mLs by mouth every 6 (six) hours as needed for cough. Patient not taking: Reported on 02/24/2017 10/10/16   Duffy Bruce, MD  ipratropium (ATROVENT HFA) 17 MCG/ACT inhaler Inhale 2 puffs into the lungs 2 (two) times daily. Patient not taking: Reported on 02/24/2017 08/21/16   Tanna Furry, MD    Family History No family history on file.  Social History Social History   Tobacco Use  . Smoking status: Former Research scientist (life sciences)  . Smokeless tobacco: Never Used  Substance Use Topics  . Alcohol use: No  . Drug use: Not on file     Allergies   Patient has no known allergies.   Review of Systems Review of Systems  Constitutional: Negative for chills and fever.  Respiratory: Negative for cough and shortness of breath.   Cardiovascular:  Positive for leg swelling. Negative for chest pain.  Musculoskeletal: Positive for myalgias.  Skin: Positive for color change.  All other systems reviewed and are negative.    Physical Exam Updated Vital Signs BP (!) 144/86   Pulse 78   Temp 97.8 F (36.6 C) (Oral)   Resp 18   Wt 69.1 kg (152 lb 4.8 oz)   SpO2 91%   BMI 23.16 kg/m   Physical Exam  Constitutional: He appears well-developed and well-nourished. No distress.  HENT:  Head: Normocephalic and atraumatic.  Eyes: Conjunctivae are normal.  Cardiovascular: Normal  rate, regular rhythm, normal heart sounds and intact distal pulses.  Pulmonary/Chest: Effort normal and breath sounds normal. No stridor. No respiratory distress. He has no wheezes. He has no rales.  Abdominal: Soft.  Musculoskeletal:  Right lower leg with edema and mild erythema.  Calf with TTP.  No tenderness in popliteal region.  Normal range of motion.  No distinct palpable cord.  Intact distal pulses.   Neurological: He is alert.  Skin: Skin is warm.  Psychiatric: He has a normal mood and affect. His behavior is normal.  Nursing note and vitals reviewed.    ED Treatments / Results  Labs (all labs ordered are listed, but only abnormal results are displayed) Labs Reviewed  CBC WITH DIFFERENTIAL/PLATELET - Abnormal; Notable for the following components:      Result Value   WBC 17.5 (*)    RBC 7.02 (*)    Hemoglobin 19.7 (*)    HCT 61.6 (*)    RDW 18.3 (*)    Platelets 71 (*)    nRBC 2 (*)    Neutro Abs 15.2 (*)    All other components within normal limits  BASIC METABOLIC PANEL - Abnormal; Notable for the following components:   GFR calc non Af Amer 59 (*)    All other components within normal limits    EKG  EKG Interpretation None       Radiology No results found.   LOWER VENOUS (DVT)  Indication: Pain, Swelling Erythema Examination Guidelines: A complete evaluation includes B-mode imaging, spectral doppler, color doppler, and power doppler as needed of all accessible portions of each vessel. Bilateral testing is considered an integral part of a complete examination. Limited examinations for reoccurring indications may be performed as noted.  Right Duplex Findings: The right common femoral vein is fully compressible. The right femoral vein at proximal thigh is partially compressible and appears spongy with compression and softly echogenic. The right femoral vein at mid thigh, femoral vein at distal thigh, perfunda femoral vein, popliteal vein, posterior tibial  vein, peroneal vein and gastrocnemius vein is non compressible and appears softly echogenic and dilated. Appears to be mobile thrombus right proximal femoral vein. Right Technical Findings: Evidence of acute obstruction in the right femoral vein at proximal thigh. Evidence of acute obstruction in the right femoral vein at mid thigh, femoral vein at distal thigh, perfunda femoral vein, popliteal vein, posterior tibial vein, peroneal vein and gastrocnemius vein. Appears to be mobile thrombus right proximal femoral vein.  Final Interpretation Right  Deep vein thrombosis involving the right femoral, popliteal, posterior tibial, peroneal, and gastrocnemius veins.  *See table(s) above for measurements and observations.  Electronically signed by Servando Snare on 02/24/2017 at 12:01:25 PM.  Procedures Procedures (including critical care time)  Medications Ordered in ED Medications  enoxaparin (LOVENOX) injection 100 mg (100 mg Subcutaneous Given 02/24/17 1439)  enoxaparin (LOVENOX) patient education kit ( Does not apply  Given 02/24/17 1501)     Initial Impression / Assessment and Plan / ED Course  I have reviewed the triage vital signs and the nursing notes.  Pertinent labs & imaging results that were available during my care of the patient were reviewed by me and considered in my medical decision making (see chart for details).     Patient presenting with 4 days of right lower extremity pain and swelling.  Venous ultrasound consistent with DVT involving the right femoral, popliteal, posterior tibial, peroneal and gastrocnemius veins.  Vascular surgery was consulted, Dr. Donzetta Matters personally evaluated the patient, and recommends patient is safe for outpatient management with anticoagulation and follow-up in his office in 2 weeks.  Patient denies shortness of breath, chest pain, or cough.  Hemodynamically stable in the ED.  Will discharge with Lovenox, vascular surgery referral, and strict return  precautions.  Patient is safe for discharge at this time.  Patient discussed with and seen by Dr. Ellender Hose.  Discussed results, findings, treatment and follow up. Patient advised of return precautions. Patient verbalized understanding and agreed with plan.  Final Clinical Impressions(s) / ED Diagnoses   Final diagnoses:  Acute deep vein thrombosis (DVT) of right lower extremity, unspecified vein [facility])    ED Discharge Orders        Ordered    enoxaparin (LOVENOX) 80 MG/0.8ML injection  Every 12 hours     02/24/17 [place], [place] N, [place] 02/24/17 1507    Duffy Bruce, MD 02/25/17 [phone]

## 2017-02-24 NOTE — Discharge Instructions (Addendum)
Please read instructions below. Call your hematologist when you get home to review the medication you have been started on, Lovenox, to confirm they agree with this medication choice. If they agree, you can fill your prescription tonight and begin taking your next dose tomorrow morning. Administer this medication every 12 hours.  Obtain a compression stocking, you can get this from the shelf at the pharmacy. You can take tylenol as needed for pain. Return immediately to the ER for sudden shortness of breath, chest pain, coughing up blood, or new or concerning symptoms.

## 2017-02-25 ENCOUNTER — Telehealth: Payer: Self-pay | Admitting: Vascular Surgery

## 2017-02-25 NOTE — Telephone Encounter (Signed)
Sched appt 03/18/17 at 8:30. Spoke to pt's wife.

## 2017-02-25 NOTE — Telephone Encounter (Signed)
-----   Message from Mena Goes, RN sent at 02/24/2017  4:16 PM EST ----- Regarding: 2-3 weeks with Dr. Donzetta Matters No labs   ----- Message ----- From: Waynetta Sandy, MD Sent: 02/24/2017   2:02 PM To: [facility] [address]  Carlos Vaughn [id number] 09-17-1945  ED level 3 consult Dx: dvt  F/u in 2-3 weeks, no studies

## 2017-03-18 ENCOUNTER — Ambulatory Visit: Payer: Medicare PPO | Admitting: Vascular Surgery

## 2017-03-23 ENCOUNTER — Ambulatory Visit: Payer: Medicare PPO | Admitting: Vascular Surgery

## 2017-05-07 ENCOUNTER — Other Ambulatory Visit: Payer: Self-pay

## 2017-05-07 ENCOUNTER — Inpatient Hospital Stay (HOSPITAL_COMMUNITY)
Admission: EM | Admit: 2017-05-07 | Discharge: 2017-05-13 | DRG: 041 | Disposition: A | Discharge status: Rehabilitation Facility | Payer: Medicare PPO | Attending: Internal Medicine | Admitting: Internal Medicine

## 2017-05-07 ENCOUNTER — Emergency Department (HOSPITAL_COMMUNITY): Payer: Medicare PPO

## 2017-05-07 ENCOUNTER — Inpatient Hospital Stay (HOSPITAL_COMMUNITY): Admit: 2017-05-07 | Payer: Medicare PPO

## 2017-05-07 ENCOUNTER — Encounter (HOSPITAL_COMMUNITY): Payer: Self-pay

## 2017-05-07 DIAGNOSIS — J449 Chronic obstructive pulmonary disease, unspecified: Secondary | ICD-10-CM | POA: Diagnosis present

## 2017-05-07 DIAGNOSIS — Z7902 Long term (current) use of antithrombotics/antiplatelets: Secondary | ICD-10-CM | POA: Diagnosis not present

## 2017-05-07 DIAGNOSIS — I824Y9 Acute embolism and thrombosis of unspecified deep veins of unspecified proximal lower extremity: Secondary | ICD-10-CM

## 2017-05-07 DIAGNOSIS — G4089 Other seizures: Secondary | ICD-10-CM | POA: Diagnosis present

## 2017-05-07 DIAGNOSIS — I824Z9 Acute embolism and thrombosis of unspecified deep veins of unspecified distal lower extremity: Secondary | ICD-10-CM

## 2017-05-07 DIAGNOSIS — D72829 Elevated white blood cell count, unspecified: Secondary | ICD-10-CM

## 2017-05-07 DIAGNOSIS — I6200 Nontraumatic subdural hemorrhage, unspecified: Secondary | ICD-10-CM | POA: Diagnosis present

## 2017-05-07 DIAGNOSIS — Z86718 Personal history of other venous thrombosis and embolism: Secondary | ICD-10-CM | POA: Diagnosis not present

## 2017-05-07 DIAGNOSIS — I639 Cerebral infarction, unspecified: Secondary | ICD-10-CM | POA: Diagnosis not present

## 2017-05-07 DIAGNOSIS — R296 Repeated falls: Secondary | ICD-10-CM | POA: Diagnosis present

## 2017-05-07 DIAGNOSIS — F4310 Post-traumatic stress disorder, unspecified: Secondary | ICD-10-CM

## 2017-05-07 DIAGNOSIS — S065X9A Traumatic subdural hemorrhage with loss of consciousness of unspecified duration, initial encounter: Secondary | ICD-10-CM

## 2017-05-07 DIAGNOSIS — G8321 Monoplegia of upper limb affecting right dominant side: Secondary | ICD-10-CM | POA: Diagnosis present

## 2017-05-07 DIAGNOSIS — R9401 Abnormal electroencephalogram [EEG]: Secondary | ICD-10-CM | POA: Diagnosis present

## 2017-05-07 DIAGNOSIS — R569 Unspecified convulsions: Secondary | ICD-10-CM

## 2017-05-07 DIAGNOSIS — F418 Other specified anxiety disorders: Secondary | ICD-10-CM | POA: Diagnosis not present

## 2017-05-07 DIAGNOSIS — D638 Anemia in other chronic diseases classified elsewhere: Secondary | ICD-10-CM | POA: Diagnosis present

## 2017-05-07 DIAGNOSIS — S3210XA Unspecified fracture of sacrum, initial encounter for closed fracture: Secondary | ICD-10-CM | POA: Diagnosis present

## 2017-05-07 DIAGNOSIS — D469 Myelodysplastic syndrome, unspecified: Secondary | ICD-10-CM | POA: Diagnosis present

## 2017-05-07 DIAGNOSIS — M7989 Other specified soft tissue disorders: Secondary | ICD-10-CM | POA: Diagnosis not present

## 2017-05-07 DIAGNOSIS — R531 Weakness: Secondary | ICD-10-CM | POA: Diagnosis present

## 2017-05-07 DIAGNOSIS — D696 Thrombocytopenia, unspecified: Secondary | ICD-10-CM | POA: Diagnosis present

## 2017-05-07 DIAGNOSIS — Z87891 Personal history of nicotine dependence: Secondary | ICD-10-CM

## 2017-05-07 DIAGNOSIS — Z7901 Long term (current) use of anticoagulants: Secondary | ICD-10-CM

## 2017-05-07 DIAGNOSIS — I82409 Acute embolism and thrombosis of unspecified deep veins of unspecified lower extremity: Secondary | ICD-10-CM

## 2017-05-07 DIAGNOSIS — S065XAA Traumatic subdural hemorrhage with loss of consciousness status unknown, initial encounter: Secondary | ICD-10-CM

## 2017-05-07 DIAGNOSIS — W19XXXA Unspecified fall, initial encounter: Secondary | ICD-10-CM | POA: Diagnosis present

## 2017-05-07 DIAGNOSIS — I824Z1 Acute embolism and thrombosis of unspecified deep veins of right distal lower extremity: Secondary | ICD-10-CM | POA: Diagnosis not present

## 2017-05-07 DIAGNOSIS — G40909 Epilepsy, unspecified, not intractable, without status epilepticus: Secondary | ICD-10-CM | POA: Diagnosis not present

## 2017-05-07 LAB — I-STAT CHEM 8, ED
BUN: 11 mg/dL (ref 6–20)
CHLORIDE: 104 mmol/L (ref 101–111)
Calcium, Ion: 1.17 mmol/L (ref 1.15–1.40)
Creatinine, Ser: 0.8 mg/dL (ref 0.61–1.24)
Glucose, Bld: 144 mg/dL — ABNORMAL HIGH (ref 65–99)
HEMATOCRIT: 35 % — AB (ref 39.0–52.0)
Hemoglobin: 11.9 g/dL — ABNORMAL LOW (ref 13.0–17.0)
Potassium: 4 mmol/L (ref 3.5–5.1)
Sodium: 139 mmol/L (ref 135–145)
TCO2: 23 mmol/L (ref 22–32)

## 2017-05-07 LAB — CBC WITH DIFFERENTIAL/PLATELET
BASOS PCT: 1 %
BLASTS: 0 %
Band Neutrophils: 11 %
Basophils Absolute: 0.1 10*3/uL (ref 0.0–0.1)
Eosinophils Absolute: 0.1 10*3/uL (ref 0.0–0.7)
Eosinophils Relative: 1 %
HCT: 36.5 % — ABNORMAL LOW (ref 39.0–52.0)
HEMOGLOBIN: 11.4 g/dL — AB (ref 13.0–17.0)
LYMPHS PCT: 6 %
Lymphs Abs: 0.8 10*3/uL (ref 0.7–4.0)
MCH: 27.1 pg (ref 26.0–34.0)
MCHC: 31.2 g/dL (ref 30.0–36.0)
MCV: 86.9 fL (ref 78.0–100.0)
Metamyelocytes Relative: 3 %
Monocytes Absolute: 0.1 10*3/uL (ref 0.1–1.0)
Monocytes Relative: 1 %
Myelocytes: 6 %
NEUTROS PCT: 70 %
NRBC: 4 /100{WBCs} — AB
Neutro Abs: 11.9 10*3/uL — ABNORMAL HIGH (ref 1.7–7.7)
Other: 0 %
PROMYELOCYTES ABS: 1 %
Platelets: 76 10*3/uL — ABNORMAL LOW (ref 150–400)
RBC: 4.2 MIL/uL — ABNORMAL LOW (ref 4.22–5.81)
RDW: 21.3 % — ABNORMAL HIGH (ref 11.5–15.5)
WBC: 13 10*3/uL — ABNORMAL HIGH (ref 4.0–10.5)

## 2017-05-07 LAB — URINALYSIS, ROUTINE W REFLEX MICROSCOPIC
BILIRUBIN URINE: NEGATIVE
Glucose, UA: NEGATIVE mg/dL
Hgb urine dipstick: NEGATIVE
KETONES UR: 20 mg/dL — AB
Leukocytes, UA: NEGATIVE
NITRITE: NEGATIVE
PH: 5 (ref 5.0–8.0)
PROTEIN: NEGATIVE mg/dL
Specific Gravity, Urine: 1.019 (ref 1.005–1.030)

## 2017-05-07 LAB — COMPREHENSIVE METABOLIC PANEL
ALK PHOS: 70 U/L (ref 38–126)
ALT: 37 U/L (ref 17–63)
ANION GAP: 8 (ref 5–15)
AST: 28 U/L (ref 15–41)
Albumin: 4.1 g/dL (ref 3.5–5.0)
BUN: 12 mg/dL (ref 6–20)
CALCIUM: 8.8 mg/dL — AB (ref 8.9–10.3)
CO2: 21 mmol/L — AB (ref 22–32)
Chloride: 107 mmol/L (ref 101–111)
Creatinine, Ser: 0.89 mg/dL (ref 0.61–1.24)
GFR calc non Af Amer: >60 mL/min (ref >60)
Glucose, Bld: 148 mg/dL — ABNORMAL HIGH (ref 65–99)
POTASSIUM: 4 mmol/L (ref 3.5–5.1)
SODIUM: 136 mmol/L (ref 135–145)
TOTAL PROTEIN: 6.7 g/dL (ref 6.5–8.1)
Total Bilirubin: 0.9 mg/dL (ref 0.3–1.2)

## 2017-05-07 LAB — I-STAT TROPONIN, ED: Troponin i, poc: 0 ng/mL (ref 0.00–0.08)

## 2017-05-07 LAB — I-STAT CG4 LACTIC ACID, ED
Lactic Acid, Venous: 1.15 mmol/L (ref 0.5–1.9)
Lactic Acid, Venous: 0.56 mmol/L (ref 0.5–1.9)

## 2017-05-07 LAB — CBG MONITORING, ED: Glucose-Capillary: 131 mg/dL — ABNORMAL HIGH (ref 65–99)

## 2017-05-07 LAB — TYPE AND SCREEN
ABO/RH(D): O NEG
ANTIBODY SCREEN: NEGATIVE

## 2017-05-07 LAB — CK: Total CK: 36 U/L — ABNORMAL LOW (ref 49–397)

## 2017-05-07 LAB — ABO/RH: ABO/RH(D): O NEG

## 2017-05-07 MED ORDER — ONDANSETRON HCL 4 MG/2ML IJ SOLN
4.0000 mg | Freq: Once | INTRAMUSCULAR | Status: AC
  Administered 2017-05-07: 4 mg via INTRAVENOUS
  Filled 2017-05-07: qty 2

## 2017-05-07 MED ORDER — ALBUTEROL SULFATE (2.5 MG/3ML) 0.083% IN NEBU
2.5000 mg | INHALATION_SOLUTION | RESPIRATORY_TRACT | Status: DC | PRN
  Administered 2017-05-10 – 2017-05-12 (×2): 2.5 mg via RESPIRATORY_TRACT
  Filled 2017-05-07 (×2): qty 3

## 2017-05-07 MED ORDER — SENNA 8.6 MG PO TABS
1.0000 | ORAL_TABLET | Freq: Two times a day (BID) | ORAL | Status: DC
  Administered 2017-05-07 – 2017-05-12 (×8): 8.6 mg via ORAL
  Filled 2017-05-07 (×9): qty 1

## 2017-05-07 MED ORDER — ACETAMINOPHEN 325 MG PO TABS
650.0000 mg | ORAL_TABLET | Freq: Four times a day (QID) | ORAL | Status: DC | PRN
  Administered 2017-05-10 – 2017-05-13 (×4): 650 mg via ORAL
  Filled 2017-05-07 (×4): qty 2

## 2017-05-07 MED ORDER — ACETAMINOPHEN 650 MG RE SUPP: 650.0000 mg | Freq: Four times a day (QID) | RECTAL | Status: DC | PRN

## 2017-05-07 MED ORDER — ONDANSETRON HCL 4 MG PO TABS: 4.0000 mg | ORAL_TABLET | Freq: Four times a day (QID) | ORAL | Status: DC | PRN

## 2017-05-07 MED ORDER — TRAZODONE HCL 50 MG PO TABS
50.0000 mg | ORAL_TABLET | Freq: Every evening | ORAL | Status: DC | PRN
  Administered 2017-05-07 – 2017-05-11 (×4): 50 mg via ORAL
  Filled 2017-05-07 (×4): qty 1

## 2017-05-07 MED ORDER — HYDRALAZINE HCL 20 MG/ML IJ SOLN: 10.0000 mg | INTRAMUSCULAR | Status: DC | PRN

## 2017-05-07 MED ORDER — PRAZOSIN HCL 1 MG PO CAPS
1.0000 mg | ORAL_CAPSULE | Freq: Every day | ORAL | Status: DC
  Administered 2017-05-08 – 2017-05-09 (×3): 1 mg via ORAL
  Filled 2017-05-07 (×4): qty 1

## 2017-05-07 MED ORDER — SODIUM CHLORIDE 0.9% FLUSH
3.0000 mL | Freq: Two times a day (BID) | INTRAVENOUS | Status: DC
  Administered 2017-05-07 – 2017-05-13 (×8): 3 mL via INTRAVENOUS

## 2017-05-07 MED ORDER — MORPHINE SULFATE (PF) 4 MG/ML IV SOLN
2.0000 mg | INTRAVENOUS | Status: DC | PRN
  Administered 2017-05-07 – 2017-05-08 (×3): 2 mg via INTRAVENOUS
  Filled 2017-05-07 (×3): qty 1

## 2017-05-07 MED ORDER — ONDANSETRON HCL 4 MG/2ML IJ SOLN
4.0000 mg | Freq: Four times a day (QID) | INTRAMUSCULAR | Status: DC | PRN
  Administered 2017-05-07 – 2017-05-08 (×2): 4 mg via INTRAVENOUS
  Filled 2017-05-07 (×2): qty 2

## 2017-05-07 MED ORDER — SODIUM CHLORIDE 0.9 % IV BOLUS (SEPSIS)
1000.0000 mL | Freq: Once | INTRAVENOUS | Status: AC
  Administered 2017-05-07: 1000 mL via INTRAVENOUS

## 2017-05-07 MED ORDER — SODIUM CHLORIDE 0.9 % IV SOLN
INTRAVENOUS | Status: DC
  Administered 2017-05-07 – 2017-05-11 (×4): via INTRAVENOUS

## 2017-05-07 MED ORDER — OXYCODONE HCL 5 MG PO TABS
5.0000 mg | ORAL_TABLET | ORAL | Status: DC | PRN
  Administered 2017-05-08 – 2017-05-12 (×9): 5 mg via ORAL
  Filled 2017-05-07 (×9): qty 1

## 2017-05-07 MED ORDER — FENTANYL CITRATE (PF) 100 MCG/2ML IJ SOLN
50.0000 ug | Freq: Once | INTRAMUSCULAR | Status: AC
  Administered 2017-05-07: 50 ug via INTRAVENOUS
  Filled 2017-05-07: qty 2

## 2017-05-07 MED ORDER — SODIUM CHLORIDE 0.9% FLUSH
3.0000 mL | INTRAVENOUS | Status: DC | PRN
  Administered 2017-05-08: 3 mL via INTRAVENOUS
  Filled 2017-05-07: qty 3

## 2017-05-07 MED ORDER — MORPHINE SULFATE (PF) 2 MG/ML IV SOLN
2.0000 mg | INTRAVENOUS | Status: DC | PRN
  Administered 2017-05-07 (×2): 2 mg via INTRAVENOUS
  Filled 2017-05-07 (×2): qty 1

## 2017-05-07 MED ORDER — ADULT MULTIVITAMIN W/MINERALS CH
1.0000 | ORAL_TABLET | Freq: Every day | ORAL | Status: DC
  Administered 2017-05-08 – 2017-05-13 (×6): 1 via ORAL
  Filled 2017-05-07 (×6): qty 1

## 2017-05-07 MED ORDER — SODIUM CHLORIDE 0.9 % IV SOLN: 250.0000 mL | INTRAVENOUS | Status: DC | PRN

## 2017-05-07 MED ORDER — ATORVASTATIN CALCIUM 40 MG PO TABS
40.0000 mg | ORAL_TABLET | Freq: Every day | ORAL | Status: DC
  Administered 2017-05-08: 40 mg via ORAL
  Filled 2017-05-07 (×2): qty 1

## 2017-05-07 MED ORDER — RUXOLITINIB PHOSPHATE 5 MG PO TABS
5.0000 mg | ORAL_TABLET | Freq: Two times a day (BID) | ORAL | Status: DC
  Administered 2017-05-09 – 2017-05-13 (×9): 5 mg via ORAL
  Filled 2017-05-07 (×10): qty 1

## 2017-05-07 MED ORDER — POLYETHYLENE GLYCOL 3350 17 G PO PACK
17.0000 g | PACK | Freq: Every day | ORAL | Status: DC | PRN
  Administered 2017-05-10: 17 g via ORAL
  Filled 2017-05-07: qty 1

## 2017-05-07 NOTE — ED Notes (Signed)
ED Provider at bedside. 

## 2017-05-07 NOTE — ED Provider Notes (Signed)
[place] DEPT Provider Note   CSN: [id number] Arrival date & time: 05/07/17  0843     History   Chief Complaint Chief Complaint  Patient presents with  . Weakness  . Fall    HPI Carlos Vaughn is a 72 y.o. male.  The history is provided by the patient. No language interpreter was used.  Weakness   Fall    Carlos Vaughn is a 72 y.o. male who presents to the Emergency Department complaining of weakness.  Level 5 caveat due to altered mental status.  History is provided by the patient and his wife.  He has had increased weakness over the last 4 days that has gradually progressed.  He reports progressive headache over the same time course.  No reports of fevers, chest pain, cough, vomiting, diarrhea, black or bloody stools.  His weakness has progressed to the point that he has been falling frequently.  No head injury or loss of consciousness.  He does endorse mild shortness of breath as well.  He has a history of myelofibrosis as well as DVT and is currently on Lovenox twice daily.  He has mild abdominal discomfort and this is chronic and unchanged from baseline for him. Past Medical History:  Diagnosis Date  . COPD (chronic obstructive pulmonary disease) ([place])   . Myelofibrosis ([place])   . PTSD (post-traumatic stress disorder)     Patient Active Problem List   Diagnosis Date Noted  . Subdural hemorrhage ([place]) 05/07/2017  . Anticoagulated for Rt Leg DVT 05/07/2017  . History of DVT of lower extremity 02/2017 05/07/2017    History reviewed. No pertinent surgical history.     Home Medications    Prior to Admission medications   Medication Sig Start Date End Date Taking? Authorizing Provider  enoxaparin (LOVENOX) 80 MG/0.8ML injection Inject 0.7 mLs (70 mg total) every 12 (twelve) hours for 9 days into the skin. Patient taking differently: Inject 1 mg/kg into the skin every 12 (twelve) hours. NOW PRESCRIBED EVERY 12 HOURS FOR AT LEAST 3 TO 6  MONTHS PER SPOUSE 02/24/17 05/07/17 Yes Quentin Cornwall, [place] N, PA-C  ibuprofen (ADVIL,MOTRIN) 200 MG tablet Take 400 mg by mouth 2 (two) times daily as needed for moderate pain.    Yes [provider]  Ipratropium-Albuterol (COMBIVENT RESPIMAT) 20-100 MCG/ACT AERS respimat Inhale 1 puff into the lungs 4 (four) times daily.   Yes [provider]  Multiple Vitamin (MULTIVITAMIN WITH MINERALS) TABS tablet Take 1 tablet by mouth daily.   Yes [provider]  prazosin (MINIPRESS) 2 MG capsule Take 1 mg at bedtime by mouth.    Yes [provider]  QUEtiapine (SEROQUEL) 100 MG tablet Take 100-200 tablets at bedtime by mouth.    Yes [provider]  ruxolitinib phosphate (JAKAFI) 5 MG tablet Take 5 mg by mouth 2 (two) times daily.   Yes [provider]  simvastatin (ZOCOR) 80 MG tablet Take 40 mg by mouth at bedtime.   Yes [provider]  albuterol (PROVENTIL HFA;VENTOLIN HFA) 108 (90 Base) MCG/ACT inhaler Inhale 1-2 puffs into the lungs every 6 (six) hours as needed for wheezing. Patient not taking: Reported on 02/24/2017 08/21/16   Tanna Furry, MD  doxycycline (VIBRAMYCIN) 100 MG capsule Take 1 capsule (100 mg total) by mouth 2 (two) times daily. Patient not taking: Reported on 02/24/2017 10/10/16   Duffy Bruce, MD  HYDROcodone-homatropine [facility]) 5-1.5 MG/5ML syrup Take 5 mLs by mouth every 6 (six) hours as  needed for cough. Patient not taking: Reported on 02/24/2017 10/10/16   Duffy Bruce, MD  ipratropium (ATROVENT HFA) 17 MCG/ACT inhaler Inhale 2 puffs into the lungs 2 (two) times daily. Patient not taking: Reported on 02/24/2017 08/21/16   Tanna Furry, MD    Family History No family history on file.  Social History Social History   Tobacco Use  . Smoking status: Former Research scientist (life sciences)  . Smokeless tobacco: Never Used  Substance Use Topics  . Alcohol use: No  . Drug use: No     Allergies   Patient has no known allergies.   Review of  Systems Review of Systems  Neurological: Positive for weakness.  All other systems reviewed and are negative.    Physical Exam Updated Vital Signs BP 116/66   Pulse 72   Temp (!) 97.5 F (36.4 C) (Oral)   Resp 18   Ht 5\' 8"  (1.727 m)   Wt 70.3 kg (155 lb)   SpO2 94%   BMI 23.57 kg/m   Physical Exam  Constitutional: He appears well-developed and well-nourished. He appears distressed.  Ill-appearing  HENT:  Head: Normocephalic and atraumatic.  Cardiovascular: Normal rate and regular rhythm.  No murmur heard. Pulmonary/Chest: Effort normal and breath sounds normal. No respiratory distress.  Abdominal: Soft. There is no rebound and no guarding.  Mild generalized abdominal tenderness  Musculoskeletal: He exhibits no edema or tenderness.  Neurological:  Drowsy but arousable to verbal stimuli.  Mildly confused.  3+ to 4 out of 5 strength in bilateral upper extremities with 3 out of 5 strength in bilateral lower extremities.  Skin: Skin is warm and dry.  Psychiatric: He has a normal mood and affect. His behavior is normal.  Nursing note and vitals reviewed.    ED Treatments / Results  Labs (all labs ordered are listed, but only abnormal results are displayed) Labs Reviewed  COMPREHENSIVE METABOLIC PANEL - Abnormal; Notable for the following components:      Result Value   CO2 21 (*)    Glucose, Bld 148 (*)    Calcium 8.8 (*)    All other components within normal limits  CBC WITH DIFFERENTIAL/PLATELET - Abnormal; Notable for the following components:   WBC 13.0 (*)    RBC 4.20 (*)    Hemoglobin 11.4 (*)    HCT 36.5 (*)    RDW 21.3 (*)    Platelets 76 (*)    nRBC 4 (*)    Neutro Abs 11.9 (*)    All other components within normal limits  CK - Abnormal; Notable for the following components:   Total CK 36 (*)    All other components within normal limits  CBG MONITORING, ED - Abnormal; Notable for the following components:   Glucose-Capillary 131 (*)    All other  components within normal limits  I-STAT CHEM 8, ED - Abnormal; Notable for the following components:   Glucose, Bld 144 (*)    Hemoglobin 11.9 (*)    HCT 35.0 (*)    All other components within normal limits  URINALYSIS, ROUTINE W REFLEX MICROSCOPIC  I-STAT TROPONIN, ED  I-STAT CG4 LACTIC ACID, ED  I-STAT CG4 LACTIC ACID, ED  TYPE AND SCREEN  ABO/RH    EKG  EKG Interpretation None       Radiology Ct Head Wo Contrast  Addendum Date: 05/07/2017   ADDENDUM REPORT: 05/07/2017 10:40 ADDENDUM: Critical Value/emergent results were called by telephone at the time of interpretation on 05/07/2017 At 1011 hours to  Dr. Quintella Reichert , who verbally acknowledged these results. Electronically Signed   By: Genevie Ann M.D.   On: 05/07/2017 10:40   Result Date: 05/07/2017 CLINICAL DATA:  72 year old male with weakness beginning 1 week ago, progressive symptoms for 2 days. EXAM: CT HEAD WITHOUT CONTRAST TECHNIQUE: Contiguous axial images were obtained from the base of the skull through the vertex without intravenous contrast. COMPARISON:  None. FINDINGS: Brain: Lobulated and mixed density but mostly hyperdense subdural hemorrhage left paracentral along the falx and left tentorium. A thin strip of hemorrhage mostly 4-5 millimeters in areas, but with focal lobulated components up to the 14 or 16 millimeters in thickness (series 2, image 22 and coronal image 52). No superimposed peripheral extra-axial hemorrhage is identified. No definite posterior fossa blood. No subarachnoid, parenchymal, or intraventricular hemorrhage. Mild mass effect on the left hemisphere including mild effacement of the left lateral ventricle occipital horn and atrium. No midline shift or ventriculomegaly. Patent basilar cisterns. No cortically based acute infarct identified. Gray-white matter differentiation appears within normal limits for age throughout the brain. Vascular: Calcified atherosclerosis at the skull base. Skull: No skull  fracture identified. Visible osseous structures appear normal. Sinuses/Orbits: Clear. Other: Visualized orbits and scalp soft tissues are within normal limits. IMPRESSION: 1. Smooth and lobulated acute appearing Subdural Hemorrhage along the medial left hemisphere tracking along the left falx and left tentorium. Hemorrhage lobulation up to 16 mm in thickness. 2. Etiology of #1 uncertain, with no superimposed skull fracture or other acute intracranial abnormality identified. Query coagulopathy. 3. Mild intracranial mass effect at this time. No midline shift and patent basilar cisterns. Electronically Signed: By: Genevie Ann M.D. On: 05/07/2017 10:11   Dg Chest Port 1 View  Result Date: 05/07/2017 CLINICAL DATA:  Pain following fall EXAM: PORTABLE CHEST 1 VIEW COMPARISON:  October 10, 2016 and Aug 21, 2016 FINDINGS: Previous airspace consolidation the right has cleared. There is mild scarring in the right mid lung. Lungs elsewhere clear. Heart is upper normal in size with pulmonary vascularity normal. There is aortic atherosclerosis. No adenopathy. No bone lesions. No evident pneumothorax. IMPRESSION: Mild scarring right mid lung. No edema or consolidation. There is aortic atherosclerosis. Aortic Atherosclerosis (ICD10-I70.0). Electronically Signed   By: Lowella Grip III M.D.   On: 05/07/2017 10:11    Procedures Procedures (including critical care time) EMERGENCY DEPARTMENT ULTRASOUND  Study: Limited Retroperitoneal Ultrasound of the Abdominal Aorta.  INDICATIONS:Age>55 Multiple views of the abdominal aorta were obtained in real-time from the diaphragmatic hiatus to the aortic bifurcation in transverse planes with a multi-frequency probe.  PERFORMED BY: Myself IMAGES ARCHIVED?: Yes LIMITATIONS:  Bowel gas INTERPRETATION:  No abdominal aortic aneurysm limited abdominal aortic aneurysm ultrasound.  Only able to visualize proximal aorta due to significant bowel gas for mid and distal aorta.  CRITICAL  CARE Performed by: Quintella Reichert   Total critical care time: 35 minutes  Critical care time was exclusive of separately billable procedures and treating other patients.  Critical care was necessary to treat or prevent imminent or life-threatening deterioration.  Critical care was time spent personally by me on the following activities: development of treatment plan with patient and/or surrogate as well as nursing, discussions with consultants, evaluation of patient's response to treatment, examination of patient, obtaining history from patient or surrogate, ordering and performing treatments and interventions, ordering and review of laboratory studies, ordering and review of radiographic studies, pulse oximetry and re-evaluation of patient's condition.   Medications Ordered in ED Medications  prazosin (  MINIPRESS) capsule 1 mg (not administered)  atorvastatin (LIPITOR) tablet 40 mg (not administered)  multivitamin with minerals tablet 1 tablet (not administered)  ruxolitinib phosphate (JAKAFI) tablet 5 mg (not administered)  sodium chloride flush (NS) 0.9 % injection 3 mL (not administered)  sodium chloride flush (NS) 0.9 % injection 3 mL (not administered)  0.9 %  sodium chloride infusion (not administered)  acetaminophen (TYLENOL) tablet 650 mg (not administered)    Or  acetaminophen (TYLENOL) suppository 650 mg (not administered)  traZODone (DESYREL) tablet 50 mg (not administered)  senna (SENOKOT) tablet 8.6 mg (not administered)  polyethylene glycol (MIRALAX / GLYCOLAX) packet 17 g (not administered)  ondansetron (ZOFRAN) tablet 4 mg (not administered)    Or  ondansetron (ZOFRAN) injection 4 mg (not administered)  albuterol (PROVENTIL) (2.5 MG/3ML) 0.083% nebulizer solution 2.5 mg (not administered)  0.9 %  sodium chloride infusion (not administered)  morphine 2 MG/ML injection 2 mg (2 mg Intravenous Given 05/07/17 1421)  oxyCODONE (Oxy IR/ROXICODONE) immediate release tablet 5  mg (not administered)  hydrALAZINE (APRESOLINE) injection 10 mg (not administered)  sodium chloride 0.9 % bolus 1,000 mL (0 mLs Intravenous Stopped 05/07/17 1137)  ondansetron (ZOFRAN) injection 4 mg (4 mg Intravenous Given 05/07/17 1004)  fentaNYL (SUBLIMAZE) injection 50 mcg (50 mcg Intravenous Given 05/07/17 1136)  ondansetron (ZOFRAN) injection 4 mg (4 mg Intravenous Given 05/07/17 1137)     Initial Impression / Assessment and Plan / ED Course  I have reviewed the triage vital signs and the nursing notes.  Pertinent labs & imaging results that were available during my care of the patient were reviewed by me and considered in my medical decision making (see chart for details).     Patient here for evaluation of weakness and frequent falls as well as headache.  He does appear uncomfortable on examination.  He has generalized weakness without focal neurologic deficits.  CT head demonstrates a subdural hematoma.  He is on chronic anticoagulation with Lovenox and he is thrombocytopenic.  His last dose of Lovenox was at 8 PM last night.  Given the duration since his last dose of Lovenox do not feel that he needs anticoagulant reversal at this time.  Discussed with Dr. Arnoldo Morale with neurosurgery who will see the patient in consult.  He does recommend transfer to [facility] for close monitoring and observation.  Hospitalist consulted for admission.  Patient and family updated of findings of studies and need for admission for further treatment and they are in agreement with treatment plan.  Final Clinical Impressions(s) / ED Diagnoses   Final diagnoses:  Subdural hematoma [facility])    ED Discharge Orders    None       Quintella Reichert, MD 05/07/17 1537

## 2017-05-07 NOTE — H&P (Signed)
Patient Demographics:    Carlos Vaughn, is a 72 y.o. 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 Smooth and lobulated acute appearing Subdural Hemorrhage along the medial left hemisphere tracking along the left falx and left tentorium. Hemorrhage lobulation up to 16 mm in thickness. 2. Etiology of #1 uncertain, with no superimposed skull fracture or other acute intracranial abnormality identified. Query coagulopathy. 3. Mild intracranial mass effect at this time. No midline shift and patent basilar cisterns.  PLAN:- 1)Lt sided acute subdural hemorrhage with mild mass-effect-neurosurgical input from Dr. Arnoldo Morale appreciated, please call neurology and neurosurgical service when patient arrives at [facility] stepdown unit, neuro checks per protocol, avoid heparin products and NSAIDs.  Hold off on PT/OT eval until more stable, repeat CT head without contrast in a.m. of 05/08/2017.  Suspect that subdural hemorrhage was spontaneous and secondary to coagulopathy in the setting of  Lovenox use, much less likely is head injury with falls and subsequent subdural hemorrhage secondary to head injury given the patient has already had a headaches, dizziness and unsteady gait for about 48 hours prior to falling, please note that exam of his head reveals no obvious injuries or bruising and CT head is without skull fractures  2)Rt LE DVT-diagnosed in November 2018, last dose of Lovenox was 7:30 PM on 05/06/2017, Lovenox has been discontinued, check lower extremity  venous Dopplers, if no acute DVT Doppler studies then may use teds and SCDs, however if acute DVT present on lower extremity venous Dopplers then consider IR consult for IVC filter placement  3)HTN-continue prazosin, keep systolic blood pressure less than 140 mmhg if possible , may use IV Hydralazine 10 mg  Every 4 hours Prn for systolic blood pressure over 150 mmhg   4)H/o MDS and chronic thrombocytopenia and Anemia-does not meet criteria for transfusion at this time, transfuse when clinically appropriate   With History of - Reviewed by me  Past Medical History:  Diagnosis Date  . COPD (chronic obstructive pulmonary disease) ([place])   . Myelofibrosis ([place])   . PTSD (post-traumatic stress disorder)       History reviewed. No pertinent surgical history.    Chief Complaint  Patient presents with  . Weakness  . Fall      HPI:    Carlos Vaughn  is a 72 y.o. male with past medical history relevant for right lower extremity DVT diagnosed in November 2018 and currently on Lovenox 70 mg twice a day injections, history of myelodysplastic syndrome with chronic thrombocytopenia as well as chronic anemia who presents to the ED with concerns about headache, unsteady gait, dizziness and recurrent  falls.  Patient is not the greatest historian so additional history obtained from patient's wife and daughter at bedside  Patient apparently started having headaches on 05/05/2017, headaches got worse on 05/06/2017, patient used ibuprofen as needed for his headaches despite being on Lovenox, around 1230 a.m on 05/07/2017 patient fell, is not sure if he hit his head or not, he apparently passed out at home landing on his right side.  When the wife got to him patient was on the floor unable to get up laying on his right side and belly, patient fell again around 4 AM on 05/06/2017.  Both falls were unwitnessed, with no concerns about seizure activity or postictal state.  No significant concerns about incontinence.   Again patient cannot tell me if he hit his head or not.  Diaphoresis, denies nausea or vomiting, denies incontinence, no speech problems, no focal extremity weakness, patient does have generalized weakness and debility with unsteady gait.  No hematemesis, no change in stool color, no hematuria  Last dose of Lovenox was 7:30 PM on 05/06/2017  No chest pains no palpitations patient did have dizziness and unsteady gait for the last couple days  In ED pt is sleepy after Fentanyl, but wakes up easily, GCS 15,  In Ed.... CT head demonstrates left-sided subdural hemorrhage with mild mass-effect, ED provider discussed this case with on-call neurosurgeon Dr. Arnoldo Morale please see full neurosurgical consultation in EMR, plan is to transfer patient to [facility] stepdown unit for further neurology and involvement     Review of systems:    In addition to the HPI above,   A full 12 point Review of 10 Systems was done, except as stated above, all other Review of 10 Systems were negative.    Social History:  Reviewed by me    Social History   Tobacco Use  . Smoking status: Former Research scientist (life sciences)  . Smokeless tobacco: Never Used  Substance Use Topics  . Alcohol use: No   PMHX-right lower extremity DVT currently on Lovenox H/o MDS and chronic thrombocytopenia and Anemia     Family History :  Reviewed by me   HTN   Home Medications:   Prior to Admission medications   Medication Sig Start Date End Date Taking? Authorizing Provider  enoxaparin (LOVENOX) 80 MG/0.8ML injection Inject 0.7 mLs (70 mg total) every 12 (twelve) hours for 9 days into the skin. Patient taking differently: Inject 1 mg/kg into the skin every 12 (twelve) hours. NOW PRESCRIBED EVERY 12 HOURS FOR AT LEAST 3 TO 6 MONTHS PER SPOUSE 02/24/17 05/07/17 Yes Quentin Cornwall, [place] N, PA-C  ibuprofen (ADVIL,MOTRIN) 200 MG tablet Take 400 mg by mouth 2 (two) times daily as needed for moderate pain.    Yes [provider]    Ipratropium-Albuterol (COMBIVENT RESPIMAT) 20-100 MCG/ACT AERS respimat Inhale 1 puff into the lungs 4 (four) times daily.   Yes [provider]  Multiple Vitamin (MULTIVITAMIN WITH MINERALS) TABS tablet Take 1 tablet by mouth daily.   Yes [provider]  prazosin (MINIPRESS) 2 MG capsule Take 1 mg at bedtime by mouth.    Yes [provider]  QUEtiapine (SEROQUEL) 100 MG tablet Take 100-200 tablets at bedtime by mouth.    Yes [provider]  ruxolitinib phosphate (JAKAFI) 5 MG tablet Take 5 mg by mouth 2 (two) times daily.   Yes [provider]  simvastatin (ZOCOR) 80 MG tablet Take 40 mg by mouth at bedtime.   Yes [provider]  albuterol (PROVENTIL HFA;VENTOLIN HFA) 108 (90 Base) MCG/ACT inhaler Inhale 1-2 puffs into the lungs every 6 (six) hours as needed for wheezing. Patient not taking: Reported on 02/24/2017 08/21/16   Tanna Furry, MD  doxycycline (VIBRAMYCIN) 100 MG capsule Take 1 capsule (100 mg total) by mouth 2 (two) times daily. Patient not taking: Reported on 02/24/2017 10/10/16   Duffy Bruce, MD  HYDROcodone-homatropine [facility]) 5-1.5 MG/5ML syrup Take 5 mLs by mouth every 6 (six) hours as needed for cough. Patient not taking: Reported on 02/24/2017 10/10/16   Duffy Bruce, MD  ipratropium (ATROVENT HFA) 17 MCG/ACT inhaler Inhale 2 puffs into the lungs 2 (two) times daily. Patient not taking: Reported on 02/24/2017 08/21/16   Tanna Furry, MD     Allergies:    No Known Allergies   Physical Exam:   Vitals  Blood pressure 111/68, pulse 78, temperature (!) 97.5 F (36.4 C), temperature source Oral, resp. rate 17, height 5\' 8"  (1.727 m), weight 70.3 kg (155 lb), SpO2 96 %.  Physical Examination: General appearance - awake and sleepy after Fentanyl given in ED, in no distress  Mental status - alert, oriented to person, place, and time, GCS 15 Head - NCAT Eyes - sclera anicteric Neck - supple, no JVD elevation , Chest -  clear  to auscultation bilaterally, symmetrical air movement,  Heart - S1 and S2 normal,  Abdomen - soft, nontender, nondistended, no masses or organomegaly Neurological - screening mental status exam normal (sleepy, but wakes up easily), GCS 15,  neck supple without rigidity, cranial nerves II through XII intact, DTR's normal and symmetric Extremities - no pedal edema noted, intact peripheral pulses  Skin - warm, dry Psych- flat affect   Data Review:    CBC Recent Labs  Lab 05/07/17 0941 05/07/17 1000  WBC 13.0*  --   HGB 11.4* 11.9*  HCT 36.5* 35.0*  PLT 76*  --   MCV 86.9  --   MCH 27.1  --   MCHC 31.2  --   RDW 21.3*  --   LYMPHSABS 0.8  --   MONOABS 0.1  --   EOSABS 0.1  --   BASOSABS 0.1  --    ------------------------------------------------------------------------------------------------------------------  Chemistries  Recent Labs  Lab 05/07/17 0941 05/07/17 1000  NA 136 139  K 4.0 4.0  CL 107 104  CO2 21*  --   GLUCOSE 148* 144*  BUN 12 11  CREATININE 0.89 0.80  CALCIUM 8.8*  --   AST 28  --   ALT 37  --   ALKPHOS 70  --   BILITOT 0.9  --    ------------------------------------------------------------------------------------------------------------------ estimated creatinine clearance is 79.6 mL/min (by C-G formula based on SCr of 0.8 mg/dL). ------------------------------------------------------------------------------------------------------------------ No results for input(s): TSH, T4TOTAL, T3FREE, THYROIDAB in the last 72 hours.  Invalid input(s): FREET3   Coagulation profile No results for input(s): INR, PROTIME in the last 168 hours. ------------------------------------------------------------------------------------------------------------------- No results for input(s): DDIMER in the last 72 hours. -------------------------------------------------------------------------------------------------------------------  Cardiac Enzymes No  results for input(s): CKMB, TROPONINI, MYOGLOBIN in the last 168 hours.  Invalid input(s): CK ------------------------------------------------------------------------------------------------------------------ No results found for: BNP   ---------------------------------------------------------------------------------------------------------------  Urinalysis No results found for: COLORURINE, APPEARANCEUR, [place], [place], GLUCOSEU, HGBUR, BILIRUBINUR, KETONESUR, PROTEINUR, UROBILINOGEN, NITRITE, LEUKOCYTESUR  ----------------------------------------------------------------------------------------------------------------   Imaging Results:    Ct Head Wo Contrast  Addendum Date: 05/07/2017   ADDENDUM REPORT: 05/07/2017 10:40 ADDENDUM: Critical Value/emergent results were called by telephone at the time of interpretation on 05/07/2017 At 1011  hours to Dr. Quintella Reichert , who verbally acknowledged these results. Electronically Signed   By: Genevie Ann M.D.   On: 05/07/2017 10:40   Result Date: 05/07/2017 CLINICAL DATA:  72 year old male with weakness beginning 1 week ago, progressive symptoms for 2 days. EXAM: CT HEAD WITHOUT CONTRAST TECHNIQUE: Contiguous axial images were obtained from the base of the skull through the vertex without intravenous contrast. COMPARISON:  None. FINDINGS: Brain: Lobulated and mixed density but mostly hyperdense subdural hemorrhage left paracentral along the falx and left tentorium. A thin strip of hemorrhage mostly 4-5 millimeters in areas, but with focal lobulated components up to the 14 or 16 millimeters in thickness (series 2, image 22 and coronal image 52). No superimposed peripheral extra-axial hemorrhage is identified. No definite posterior fossa blood. No subarachnoid, parenchymal, or intraventricular hemorrhage. Mild mass effect on the left hemisphere including mild effacement of the left lateral ventricle occipital horn and atrium. No midline shift or  ventriculomegaly. Patent basilar cisterns. No cortically based acute infarct identified. Gray-white matter differentiation appears within normal limits for age throughout the brain. Vascular: Calcified atherosclerosis at the skull base. Skull: No skull fracture identified. Visible osseous structures appear normal. Sinuses/Orbits: Clear. Other: Visualized orbits and scalp soft tissues are within normal limits. IMPRESSION: 1. Smooth and lobulated acute appearing Subdural Hemorrhage along the medial left hemisphere tracking along the left falx and left tentorium. Hemorrhage lobulation up to 16 mm in thickness. 2. Etiology of #1 uncertain, with no superimposed skull fracture or other acute intracranial abnormality identified. Query coagulopathy. 3. Mild intracranial mass effect at this time. No midline shift and patent basilar cisterns. Electronically Signed: By: Genevie Ann M.D. On: 05/07/2017 10:11   Dg Chest Port 1 View  Result Date: 05/07/2017 CLINICAL DATA:  Pain following fall EXAM: PORTABLE CHEST 1 VIEW COMPARISON:  October 10, 2016 and Aug 21, 2016 FINDINGS: Previous airspace consolidation the right has cleared. There is mild scarring in the right mid lung. Lungs elsewhere clear. Heart is upper normal in size with pulmonary vascularity normal. There is aortic atherosclerosis. No adenopathy. No bone lesions. No evident pneumothorax. IMPRESSION: Mild scarring right mid lung. No edema or consolidation. There is aortic atherosclerosis. Aortic Atherosclerosis (ICD10-I70.0). Electronically Signed   By: Lowella Grip III M.D.   On: 05/07/2017 10:11    Radiological Exams on Admission: Ct Head Wo Contrast  Addendum Date: 05/07/2017   ADDENDUM REPORT: 05/07/2017 10:40 ADDENDUM: Critical Value/emergent results were called by telephone at the time of interpretation on 05/07/2017 At 1011 hours to Dr. Quintella Reichert , who verbally acknowledged these results. Electronically Signed   By: Genevie Ann M.D.   On: 05/07/2017 10:40    Result Date: 05/07/2017 CLINICAL DATA:  72 year old male with weakness beginning 1 week ago, progressive symptoms for 2 days. EXAM: CT HEAD WITHOUT CONTRAST TECHNIQUE: Contiguous axial images were obtained from the base of the skull through the vertex without intravenous contrast. COMPARISON:  None. FINDINGS: Brain: Lobulated and mixed density but mostly hyperdense subdural hemorrhage left paracentral along the falx and left tentorium. A thin strip of hemorrhage mostly 4-5 millimeters in areas, but with focal lobulated components up to the 14 or 16 millimeters in thickness (series 2, image 22 and coronal image 52). No superimposed peripheral extra-axial hemorrhage is identified. No definite posterior fossa blood. No subarachnoid, parenchymal, or intraventricular hemorrhage. Mild mass effect on the left hemisphere including mild effacement of the left lateral ventricle occipital horn and atrium. No midline shift or ventriculomegaly. Patent  basilar cisterns. No cortically based acute infarct identified. Gray-white matter differentiation appears within normal limits for age throughout the brain. Vascular: Calcified atherosclerosis at the skull base. Skull: No skull fracture identified. Visible osseous structures appear normal. Sinuses/Orbits: Clear. Other: Visualized orbits and scalp soft tissues are within normal limits. IMPRESSION: 1. Smooth and lobulated acute appearing Subdural Hemorrhage along the medial left hemisphere tracking along the left falx and left tentorium. Hemorrhage lobulation up to 16 mm in thickness. 2. Etiology of #1 uncertain, with no superimposed skull fracture or other acute intracranial abnormality identified. Query coagulopathy. 3. Mild intracranial mass effect at this time. No midline shift and patent basilar cisterns. Electronically Signed: By: Genevie Ann M.D. On: 05/07/2017 10:11   Dg Chest Port 1 View  Result Date: 05/07/2017 CLINICAL DATA:  Pain following fall EXAM: PORTABLE CHEST 1  VIEW COMPARISON:  October 10, 2016 and Aug 21, 2016 FINDINGS: Previous airspace consolidation the right has cleared. There is mild scarring in the right mid lung. Lungs elsewhere clear. Heart is upper normal in size with pulmonary vascularity normal. There is aortic atherosclerosis. No adenopathy. No bone lesions. No evident pneumothorax. IMPRESSION: Mild scarring right mid lung. No edema or consolidation. There is aortic atherosclerosis. Aortic Atherosclerosis (ICD10-I70.0). Electronically Signed   By: Lowella Grip III M.D.   On: 05/07/2017 10:11    DVT Prophylaxis - use TEDs/SCD if LE dopplers are neg AM Labs Ordered, also please review Full Orders  Family Communication: Admission, patients condition and plan of care including tests being ordered have been discussed with the patient and wife/daughter who indicate understanding and agree with the plan   Code Status - Full Code  Likely DC to  TBD  Condition   fair  Roxan Hockey M.D on 05/07/2017 at 2:48 PM   Between 7am to 7pm - Pager - [phone] After 7pm go to [email or web address] - password [email or web address]  [facility] - Office  [phone]  Voice Recognition Viviann Spare dictation system was used to create this note, attempts have been made to correct errors. Please contact the author with questions and/or clarifications.

## 2017-05-07 NOTE — ED Notes (Signed)
Called report spoke with rose rn Called [facility] report given  Paper work complete

## 2017-05-07 NOTE — ED Notes (Signed)
Pt has been provided with a urinal at this time for a urine sample to be obtained.

## 2017-05-07 NOTE — ED Notes (Signed)
ED TO INPATIENT HANDOFF REPORT  Name/Age/Gender Carlos Vaughn 72 y.o. male  Code Status    Code Status Orders  (From admission, onward)        Start     Ordered   05/07/17 1243  Full code  Continuous     05/07/17 1242    Code Status History    Date Active Date Inactive Code Status Order ID Comments User Context   This patient has a current code status but no historical code status.    Advance Directive Documentation     Most Recent Value  Type of Advance Directive  Healthcare Power of Attorney, Living will  Pre-existing out of facility DNR order (yellow form or pink MOST form)  No data  "MOST" Form in Place?  No data      Home/SNF/Other Home  Chief Complaint fall  Level of Care/Admitting Diagnosis ED Disposition    ED Disposition Condition Comment   Admit  Hospital Area: [place] [[id number]]  Level of Care: Stepdown [14]  Diagnosis: Subdural hemorrhage ([place]) [432.1.ICD-9-CM]  Admitting Physician: Morrison Old  Attending Physician: Morrison Old  Estimated length of stay: 3 - 4 days  Certification:: I certify this patient will need inpatient services for at least 2 midnights  Bed request comments: stepdown  PT Class (Do Not Modify): Inpatient [101]  PT Acc Code (Do Not Modify): Private [1]       Medical History Past Medical History:  Diagnosis Date  . COPD (chronic obstructive pulmonary disease) ([place])   . Myelofibrosis ([place])   . PTSD (post-traumatic stress disorder)     Allergies No Known Allergies  IV Location/Drains/Wounds Patient Lines/Drains/Airways Status   Active Line/Drains/Airways    Name:   Placement date:   Placement time:   Site:   Days:   Peripheral IV 05/07/17 Left Forearm   05/07/17    -    Forearm   less than 1   Peripheral IV 05/07/17 Right Antecubital   05/07/17    0953    Antecubital   less than 1          Labs/Imaging Results for orders placed or performed during the hospital  encounter of 05/07/17 (from the past 48 hour(s))  CBG monitoring, ED     Status: Abnormal   Collection Time: 05/07/17  9:16 AM  Result Value Ref Range   Glucose-Capillary 131 (H) 65 - 99 mg/dL  Comprehensive metabolic panel     Status: Abnormal   Collection Time: 05/07/17  9:41 AM  Result Value Ref Range   Sodium 136 135 - 145 mmol/L   Potassium 4.0 3.5 - 5.1 mmol/L   Chloride 107 101 - 111 mmol/L   CO2 21 (L) 22 - 32 mmol/L   Glucose, Bld 148 (H) 65 - 99 mg/dL   BUN 12 6 - 20 mg/dL   Creatinine, Ser 0.89 0.61 - 1.24 mg/dL   Calcium 8.8 (L) 8.9 - 10.3 mg/dL   Total Protein 6.7 6.5 - 8.1 g/dL   Albumin 4.1 3.5 - 5.0 g/dL   AST 28 15 - 41 U/L   ALT 37 17 - 63 U/L   Alkaline Phosphatase 70 38 - 126 U/L   Total Bilirubin 0.9 0.3 - 1.2 mg/dL   GFR calc non Af Amer >60 >60 mL/min   GFR calc Af Amer >60 >60 mL/min    Comment: (NOTE) The eGFR has been calculated using the CKD EPI equation. This calculation has not  been validated in all clinical situations. eGFR's persistently <60 mL/min signify possible Chronic Kidney Disease.    Anion gap 8 5 - 15  CBC with Differential     Status: Abnormal   Collection Time: 05/07/17  9:41 AM  Result Value Ref Range   WBC 13.0 (H) 4.0 - 10.5 K/uL   RBC 4.20 (L) 4.22 - 5.81 MIL/uL   Hemoglobin 11.4 (L) 13.0 - 17.0 g/dL   HCT 36.5 (L) 39.0 - 52.0 %   MCV 86.9 78.0 - 100.0 fL   MCH 27.1 26.0 - 34.0 pg   MCHC 31.2 30.0 - 36.0 g/dL   RDW 21.3 (H) 11.5 - 15.5 %   Platelets 76 (L) 150 - 400 K/uL    Comment: RESULT REPEATED AND VERIFIED SPECIMEN CHECKED FOR CLOTS PLATELET COUNT CONFIRMED BY SMEAR    Neutrophils Relative % 70 %   Lymphocytes Relative 6 %   Monocytes Relative 1 %   Eosinophils Relative 1 %   Basophils Relative 1 %   Band Neutrophils 11 %   Metamyelocytes Relative 3 %   Myelocytes 6 %   Promyelocytes Absolute 1 %   Blasts 0 %   nRBC 4 (H) 0 /100 WBC   Other 0 %   Neutro Abs 11.9 (H) 1.7 - 7.7 K/uL   Lymphs Abs 0.8 0.7 - 4.0  K/uL   Monocytes Absolute 0.1 0.1 - 1.0 K/uL   Eosinophils Absolute 0.1 0.0 - 0.7 K/uL   Basophils Absolute 0.1 0.0 - 0.1 K/uL   RBC Morphology RARE NRBCs    WBC Morphology      MODERATE LEFT SHIFT (>5% METAS AND MYELOS,OCC PRO NOTED)  CK     Status: Abnormal   Collection Time: 05/07/17  9:41 AM  Result Value Ref Range   Total CK 36 (L) 49 - 397 U/L  Type and screen [place]     Status: None   Collection Time: 05/07/17  9:41 AM  Result Value Ref Range   ABO/RH(D) O NEG    Antibody Screen NEG    Sample Expiration 05/10/2017   ABO/Rh     Status: None   Collection Time: 05/07/17  9:50 AM  Result Value Ref Range   ABO/RH(D) O NEG   I-stat troponin, ED     Status: None   Collection Time: 05/07/17  9:59 AM  Result Value Ref Range   Troponin i, poc 0.00 0.00 - 0.08 ng/mL   Comment 3            Comment: Due to the release kinetics of cTnI, a negative result within the first hours of the onset of symptoms does not rule out myocardial infarction with certainty. If myocardial infarction is still suspected, repeat the test at appropriate intervals.   I-Stat CG4 Lactic Acid, ED     Status: None   Collection Time: 05/07/17 10:00 AM  Result Value Ref Range   Lactic Acid, Venous 1.15 0.5 - 1.9 mmol/L  I-stat Chem 8, ED     Status: Abnormal   Collection Time: 05/07/17 10:00 AM  Result Value Ref Range   Sodium 139 135 - 145 mmol/L   Potassium 4.0 3.5 - 5.1 mmol/L   Chloride 104 101 - 111 mmol/L   BUN 11 6 - 20 mg/dL   Creatinine, Ser 0.80 0.61 - 1.24 mg/dL   Glucose, Bld 144 (H) 65 - 99 mg/dL   Calcium, Ion 1.17 1.15 - 1.40 mmol/L   TCO2 23  22 - 32 mmol/L   Hemoglobin 11.9 (L) 13.0 - 17.0 g/dL   HCT 35.0 (L) 39.0 - 52.0 %  I-Stat CG4 Lactic Acid, ED     Status: None   Collection Time: 05/07/17 11:05 AM  Result Value Ref Range   Lactic Acid, Venous 0.56 0.5 - 1.9 mmol/L  Urinalysis, Routine w reflex microscopic     Status: Abnormal   Collection Time: 05/07/17   4:50 PM  Result Value Ref Range   Color, Urine YELLOW YELLOW   APPearance CLEAR CLEAR   Specific Gravity, Urine 1.019 1.005 - 1.030   pH 5.0 5.0 - 8.0   Glucose, UA NEGATIVE NEGATIVE mg/dL   Hgb urine dipstick NEGATIVE NEGATIVE   Bilirubin Urine NEGATIVE NEGATIVE   Ketones, ur 20 (A) NEGATIVE mg/dL   Protein, ur NEGATIVE NEGATIVE mg/dL   Nitrite NEGATIVE NEGATIVE   Leukocytes, UA NEGATIVE NEGATIVE   Ct Head Wo Contrast  Addendum Date: 05/07/2017   ADDENDUM REPORT: 05/07/2017 10:40 ADDENDUM: Critical Value/emergent results were called by telephone at the time of interpretation on 05/07/2017 At 1011 hours to Dr. Quintella Reichert , who verbally acknowledged these results. Electronically Signed   By: Genevie Ann M.D.   On: 05/07/2017 10:40   Result Date: 05/07/2017 CLINICAL DATA:  72 year old male with weakness beginning 1 week ago, progressive symptoms for 2 days. EXAM: CT HEAD WITHOUT CONTRAST TECHNIQUE: Contiguous axial images were obtained from the base of the skull through the vertex without intravenous contrast. COMPARISON:  None. FINDINGS: Brain: Lobulated and mixed density but mostly hyperdense subdural hemorrhage left paracentral along the falx and left tentorium. A thin strip of hemorrhage mostly 4-5 millimeters in areas, but with focal lobulated components up to the 14 or 16 millimeters in thickness (series 2, image 22 and coronal image 52). No superimposed peripheral extra-axial hemorrhage is identified. No definite posterior fossa blood. No subarachnoid, parenchymal, or intraventricular hemorrhage. Mild mass effect on the left hemisphere including mild effacement of the left lateral ventricle occipital horn and atrium. No midline shift or ventriculomegaly. Patent basilar cisterns. No cortically based acute infarct identified. Gray-white matter differentiation appears within normal limits for age throughout the brain. Vascular: Calcified atherosclerosis at the skull base. Skull: No skull fracture  identified. Visible osseous structures appear normal. Sinuses/Orbits: Clear. Other: Visualized orbits and scalp soft tissues are within normal limits. IMPRESSION: 1. Smooth and lobulated acute appearing Subdural Hemorrhage along the medial left hemisphere tracking along the left falx and left tentorium. Hemorrhage lobulation up to 16 mm in thickness. 2. Etiology of #1 uncertain, with no superimposed skull fracture or other acute intracranial abnormality identified. Query coagulopathy. 3. Mild intracranial mass effect at this time. No midline shift and patent basilar cisterns. Electronically Signed: By: Genevie Ann M.D. On: 05/07/2017 10:11   Dg Chest Port 1 View  Result Date: 05/07/2017 CLINICAL DATA:  Pain following fall EXAM: PORTABLE CHEST 1 VIEW COMPARISON:  October 10, 2016 and Aug 21, 2016 FINDINGS: Previous airspace consolidation the right has cleared. There is mild scarring in the right mid lung. Lungs elsewhere clear. Heart is upper normal in size with pulmonary vascularity normal. There is aortic atherosclerosis. No adenopathy. No bone lesions. No evident pneumothorax. IMPRESSION: Mild scarring right mid lung. No edema or consolidation. There is aortic atherosclerosis. Aortic Atherosclerosis (ICD10-I70.0). Electronically Signed   By: Lowella Grip III M.D.   On: 05/07/2017 10:11    Pending Labs [employer] (From admission, onward)   Start  Ordered   05/08/17 5670  Basic metabolic panel  Tomorrow morning,   R     05/07/17 1242   05/08/17 0500  CBC  Tomorrow morning,   R     05/07/17 1242      Vitals/Pain Today's Vitals   05/07/17 1930 05/07/17 2000 05/07/17 2027 05/07/17 2028  BP: 112/62 129/71 130/80   Pulse: 71 79 80   Resp: '18 20 20   ' Temp:   97.8 F (36.6 C)   TempSrc:   Oral   SpO2: 93% 94% 95%   Weight:      Height:      PainSc:   5  4     Isolation Precautions No active isolations  Medications Medications  prazosin (MINIPRESS) capsule 1 mg (not administered)   atorvastatin (LIPITOR) tablet 40 mg (not administered)  multivitamin with minerals tablet 1 tablet (not administered)  ruxolitinib phosphate (JAKAFI) tablet 5 mg (not administered)  sodium chloride flush (NS) 0.9 % injection 3 mL (3 mLs Intravenous Given 05/07/17 1648)  sodium chloride flush (NS) 0.9 % injection 3 mL (not administered)  0.9 %  sodium chloride infusion (not administered)  acetaminophen (TYLENOL) tablet 650 mg (not administered)    Or  acetaminophen (TYLENOL) suppository 650 mg (not administered)  traZODone (DESYREL) tablet 50 mg (not administered)  senna (SENOKOT) tablet 8.6 mg (not administered)  polyethylene glycol (MIRALAX / GLYCOLAX) packet 17 g (not administered)  ondansetron (ZOFRAN) tablet 4 mg (not administered)    Or  ondansetron (ZOFRAN) injection 4 mg (not administered)  albuterol (PROVENTIL) (2.5 MG/3ML) 0.083% nebulizer solution 2.5 mg (not administered)  0.9 %  sodium chloride infusion (not administered)  morphine 2 MG/ML injection 2 mg (2 mg Intravenous Given 05/07/17 1801)  oxyCODONE (Oxy IR/ROXICODONE) immediate release tablet 5 mg (not administered)  hydrALAZINE (APRESOLINE) injection 10 mg (not administered)  sodium chloride 0.9 % bolus 1,000 mL (0 mLs Intravenous Stopped 05/07/17 1137)  ondansetron (ZOFRAN) injection 4 mg (4 mg Intravenous Given 05/07/17 1004)  fentaNYL (SUBLIMAZE) injection 50 mcg (50 mcg Intravenous Given 05/07/17 1136)  ondansetron (ZOFRAN) injection 4 mg (4 mg Intravenous Given 05/07/17 1137)    Mobility walks with device

## 2017-05-07 NOTE — Consult Note (Signed)
Reason for Consult: Left subdural hematoma Referring Physician: Dr. Bruna Potter is an 72 y.o. male.  HPI: The patient is a 72 year old white male with myelodysplastic syndrome and thrombocytopenia, history of deep venous thrombosis, on Lovenox.  The patient has felt generally weak.  He took a couple falls yesterday but does not recall striking his head.  Has had difficulty standing and walking because of his generalized weakness.  He came to the [facility] long ER and was evaluated by Dr. Ralene Bathe and a head CT was obtained which demonstrated a left tentorial and hemispheric subdural hematoma.  A neurosurgical consultation has been requested.  The patient is going to be transferred to [facility] for further observation.  Presently the patient is accompanied by his wife.  He complains of a headache.  He feels generally weak but denies focal weakness, numbness, tingling, etc.  He has had no seizures no nausea, vomiting, etc.  Past Medical History:  Diagnosis Date  . COPD (chronic obstructive pulmonary disease) ([place])   . Myelofibrosis ([place])   . PTSD (post-traumatic stress disorder)     History reviewed. No pertinent surgical history.  No family history on file.  Social History:  reports that he has quit smoking. he has never used smokeless tobacco. He reports that he does not drink alcohol or use drugs.  Allergies: No Known Allergies  Medications:  I have reviewed the patient's current medications. Prior to Admission:  (Not in a hospital admission) Scheduled: . atorvastatin  40 mg Oral q1800  . multivitamin with minerals  1 tablet Oral Daily  . prazosin  1 mg Oral QHS  . ruxolitinib phosphate  5 mg Oral BID  . senna  1 tablet Oral BID  . sodium chloride flush  3 mL Intravenous Q12H   Continuous: . sodium chloride    . sodium chloride     ELF:YBOFBP chloride, acetaminophen **OR** acetaminophen, albuterol, morphine injection, ondansetron **OR** ondansetron (ZOFRAN) IV,  oxyCODONE, polyethylene glycol, sodium chloride flush, traZODone Anti-infectives (From admission, onward)   None       Results for orders placed or performed during the hospital encounter of 05/07/17 (from the past 48 hour(s))  CBG monitoring, ED     Status: Abnormal   Collection Time: 05/07/17  9:16 AM  Result Value Ref Range   Glucose-Capillary 131 (H) 65 - 99 mg/dL  Comprehensive metabolic panel     Status: Abnormal   Collection Time: 05/07/17  9:41 AM  Result Value Ref Range   Sodium 136 135 - 145 mmol/L   Potassium 4.0 3.5 - 5.1 mmol/L   Chloride 107 101 - 111 mmol/L   CO2 21 (L) 22 - 32 mmol/L   Glucose, Bld 148 (H) 65 - 99 mg/dL   BUN 12 6 - 20 mg/dL   Creatinine, Ser 0.89 0.61 - 1.24 mg/dL   Calcium 8.8 (L) 8.9 - 10.3 mg/dL   Total Protein 6.7 6.5 - 8.1 g/dL   Albumin 4.1 3.5 - 5.0 g/dL   AST 28 15 - 41 U/L   ALT 37 17 - 63 U/L   Alkaline Phosphatase 70 38 - 126 U/L   Total Bilirubin 0.9 0.3 - 1.2 mg/dL   GFR calc non Af Amer >60 >60 mL/min   GFR calc Af Amer >60 >60 mL/min    Comment: (NOTE) The eGFR has been calculated using the CKD EPI equation. This calculation has not been validated in all clinical situations. eGFR's persistently <60 mL/min signify possible  Chronic Kidney Disease.    Anion gap 8 5 - 15  CBC with Differential     Status: Abnormal   Collection Time: 05/07/17  9:41 AM  Result Value Ref Range   WBC 13.0 (H) 4.0 - 10.5 K/uL   RBC 4.20 (L) 4.22 - 5.81 MIL/uL   Hemoglobin 11.4 (L) 13.0 - 17.0 g/dL   HCT 36.5 (L) 39.0 - 52.0 %   MCV 86.9 78.0 - 100.0 fL   MCH 27.1 26.0 - 34.0 pg   MCHC 31.2 30.0 - 36.0 g/dL   RDW 21.3 (H) 11.5 - 15.5 %   Platelets 76 (L) 150 - 400 K/uL    Comment: RESULT REPEATED AND VERIFIED SPECIMEN CHECKED FOR CLOTS PLATELET COUNT CONFIRMED BY SMEAR    Neutrophils Relative % 70 %   Lymphocytes Relative 6 %   Monocytes Relative 1 %   Eosinophils Relative 1 %   Basophils Relative 1 %   Band Neutrophils 11 %    Metamyelocytes Relative 3 %   Myelocytes 6 %   Promyelocytes Absolute 1 %   Blasts 0 %   nRBC 4 (H) 0 /100 WBC   Other 0 %   Neutro Abs 11.9 (H) 1.7 - 7.7 K/uL   Lymphs Abs 0.8 0.7 - 4.0 K/uL   Monocytes Absolute 0.1 0.1 - 1.0 K/uL   Eosinophils Absolute 0.1 0.0 - 0.7 K/uL   Basophils Absolute 0.1 0.0 - 0.1 K/uL   RBC Morphology RARE NRBCs    WBC Morphology      MODERATE LEFT SHIFT (>5% METAS AND MYELOS,OCC PRO NOTED)  CK     Status: Abnormal   Collection Time: 05/07/17  9:41 AM  Result Value Ref Range   Total CK 36 (L) 49 - 397 U/L  Type and screen [place]     Status: None   Collection Time: 05/07/17  9:41 AM  Result Value Ref Range   ABO/RH(D) O NEG    Antibody Screen NEG    Sample Expiration 05/10/2017   ABO/Rh     Status: None   Collection Time: 05/07/17  9:50 AM  Result Value Ref Range   ABO/RH(D) O NEG   I-stat troponin, ED     Status: None   Collection Time: 05/07/17  9:59 AM  Result Value Ref Range   Troponin i, poc 0.00 0.00 - 0.08 ng/mL   Comment 3            Comment: Due to the release kinetics of cTnI, a negative result within the first hours of the onset of symptoms does not rule out myocardial infarction with certainty. If myocardial infarction is still suspected, repeat the test at appropriate intervals.   I-Stat CG4 Lactic Acid, ED     Status: None   Collection Time: 05/07/17 10:00 AM  Result Value Ref Range   Lactic Acid, Venous 1.15 0.5 - 1.9 mmol/L  I-stat Chem 8, ED     Status: Abnormal   Collection Time: 05/07/17 10:00 AM  Result Value Ref Range   Sodium 139 135 - 145 mmol/L   Potassium 4.0 3.5 - 5.1 mmol/L   Chloride 104 101 - 111 mmol/L   BUN 11 6 - 20 mg/dL   Creatinine, Ser 0.80 0.61 - 1.24 mg/dL   Glucose, Bld 144 (H) 65 - 99 mg/dL   Calcium, Ion 1.17 1.15 - 1.40 mmol/L   TCO2 23 22 - 32 mmol/L   Hemoglobin 11.9 (L) 13.0 - 17.0  g/dL   HCT 35.0 (L) 39.0 - 52.0 %  I-Stat CG4 Lactic Acid, ED     Status: None    Collection Time: 05/07/17 11:05 AM  Result Value Ref Range   Lactic Acid, Venous 0.56 0.5 - 1.9 mmol/L    Ct Head Wo Contrast  Addendum Date: 05/07/2017   ADDENDUM REPORT: 05/07/2017 10:40 ADDENDUM: Critical Value/emergent results were called by telephone at the time of interpretation on 05/07/2017 At 1011 hours to Dr. Quintella Reichert , who verbally acknowledged these results. Electronically Signed   By: Genevie Ann M.D.   On: 05/07/2017 10:40   Result Date: 05/07/2017 CLINICAL DATA:  72 year old male with weakness beginning 1 week ago, progressive symptoms for 2 days. EXAM: CT HEAD WITHOUT CONTRAST TECHNIQUE: Contiguous axial images were obtained from the base of the skull through the vertex without intravenous contrast. COMPARISON:  None. FINDINGS: Brain: Lobulated and mixed density but mostly hyperdense subdural hemorrhage left paracentral along the falx and left tentorium. A thin strip of hemorrhage mostly 4-5 millimeters in areas, but with focal lobulated components up to the 14 or 16 millimeters in thickness (series 2, image 22 and coronal image 52). No superimposed peripheral extra-axial hemorrhage is identified. No definite posterior fossa blood. No subarachnoid, parenchymal, or intraventricular hemorrhage. Mild mass effect on the left hemisphere including mild effacement of the left lateral ventricle occipital horn and atrium. No midline shift or ventriculomegaly. Patent basilar cisterns. No cortically based acute infarct identified. Gray-white matter differentiation appears within normal limits for age throughout the brain. Vascular: Calcified atherosclerosis at the skull base. Skull: No skull fracture identified. Visible osseous structures appear normal. Sinuses/Orbits: Clear. Other: Visualized orbits and scalp soft tissues are within normal limits. IMPRESSION: 1. Smooth and lobulated acute appearing Subdural Hemorrhage along the medial left hemisphere tracking along the left falx and left tentorium.  Hemorrhage lobulation up to 16 mm in thickness. 2. Etiology of #1 uncertain, with no superimposed skull fracture or other acute intracranial abnormality identified. Query coagulopathy. 3. Mild intracranial mass effect at this time. No midline shift and patent basilar cisterns. Electronically Signed: By: Genevie Ann M.D. On: 05/07/2017 10:11   Dg Chest Port 1 View  Result Date: 05/07/2017 CLINICAL DATA:  Pain following fall EXAM: PORTABLE CHEST 1 VIEW COMPARISON:  October 10, 2016 and Aug 21, 2016 FINDINGS: Previous airspace consolidation the right has cleared. There is mild scarring in the right mid lung. Lungs elsewhere clear. Heart is upper normal in size with pulmonary vascularity normal. There is aortic atherosclerosis. No adenopathy. No bone lesions. No evident pneumothorax. IMPRESSION: Mild scarring right mid lung. No edema or consolidation. There is aortic atherosclerosis. Aortic Atherosclerosis (ICD10-I70.0). Electronically Signed   By: Lowella Grip III M.D.   On: 05/07/2017 10:11    ROS: As above he denies neck pain, radicular arm pain, etc. Blood pressure 110/67, pulse 73, temperature (!) 97.5 F (36.4 C), temperature source Oral, resp. rate 19, height _0  (1.727 m), weight 70.3 kg (155 lb), SpO2 94 %. Estimated body mass index is 23.57 kg/m as calculated from the following:   Height as of this encounter: _1  (1.727 m).   Weight as of this encounter: 70.3 kg (155 lb).  Physical Exam  General: An alert and pleasant 72 year old white male in no apparent distress.  HEENT: Normocephalic, extraocular muscles intact  Neck: Nontender, normal range of motion  Thorax: Symmetric  Abdomen: Soft  Extremities: Unremarkable  Neurologic exam: The patient is alert and oriented x3,  Glasgow Coma Scale 15.  Cranial nerves II through XII were examined bilaterally grossly normal.  Vision and hearing are grossly normal bilaterally.  The patient's motor strength is 5/5 in spinal bicep, triceps,  handgrip, quadriceps, gastrocnemius, dorsiflexors.  Cerebellar function is intact to rapid alternate movements of the upper extremity bilaterally.  Sensory function is intact to light touch sensation all tested dermatomes bilaterally.  I have reviewed the patient's head CT performed at [facility] hospital today.  He has a left tentorial and posterior interhemispheric subdural hematoma with mild mass-effect. Assessment/Plan: Left subdural hematoma: I have discussed the situation with the patient and his wife.  I recommend he be transferred to [facility] for further observation in case he needs surgery although I think this is unlikely.  We will plan to repeat his CAT scan tomorrow.  Obviously we need to discontinue his Lovenox for the time being.  I do not think we need to reverse his Lovenox since his last dose was about 18 hours ago and he seems clinically stable.  Ophelia Charter 05/07/2017, 12:50 PM

## 2017-05-07 NOTE — ED Triage Notes (Signed)
Pt arrived via [facility] from home. Pt stated that he has had general weakness all over. Pt fell yesterday on buttocks. Pt decided to call EMS due to the fact that pt could barely move and has little strength. Pt is AOx4. Pt stated that he began to feel weak about 1 week ago but the weakness has gotten extremely worse in the past 2 days.

## 2017-05-08 ENCOUNTER — Inpatient Hospital Stay (HOSPITAL_COMMUNITY): Payer: Medicare PPO

## 2017-05-08 ENCOUNTER — Encounter (HOSPITAL_COMMUNITY): Payer: Self-pay | Admitting: Diagnostic Radiology

## 2017-05-08 DIAGNOSIS — M7989 Other specified soft tissue disorders: Secondary | ICD-10-CM

## 2017-05-08 DIAGNOSIS — I639 Cerebral infarction, unspecified: Secondary | ICD-10-CM

## 2017-05-08 HISTORY — PX: IR IVC FILTER PLMT / S&I /IMG GUID/MOD SED: IMG701

## 2017-05-08 LAB — TYPE AND SCREEN
ABO/RH(D): O NEG
ANTIBODY SCREEN: NEGATIVE

## 2017-05-08 LAB — MRSA PCR SCREENING: MRSA BY PCR: NEGATIVE

## 2017-05-08 LAB — ABO/RH: ABO/RH(D): O NEG

## 2017-05-08 MED ORDER — LIDOCAINE HCL (PF) 1 % IJ SOLN
INTRAMUSCULAR | Status: AC | PRN
  Administered 2017-05-08: 8 mL

## 2017-05-08 MED ORDER — ROSUVASTATIN CALCIUM 5 MG PO TABS
10.0000 mg | ORAL_TABLET | Freq: Every day | ORAL | Status: DC
  Administered 2017-05-09 – 2017-05-12 (×4): 10 mg via ORAL
  Filled 2017-05-08 (×2): qty 2
  Filled 2017-05-08: qty 1
  Filled 2017-05-08 (×2): qty 2

## 2017-05-08 MED ORDER — MIDAZOLAM HCL 2 MG/2ML IJ SOLN
INTRAMUSCULAR | Status: AC
  Filled 2017-05-08: qty 2

## 2017-05-08 MED ORDER — MIDAZOLAM HCL 2 MG/2ML IJ SOLN
INTRAMUSCULAR | Status: AC | PRN
  Administered 2017-05-08: 1 mg via INTRAVENOUS

## 2017-05-08 MED ORDER — PHENYTOIN SODIUM 50 MG/ML IJ SOLN
100.0000 mg | Freq: Three times a day (TID) | INTRAMUSCULAR | Status: DC
  Administered 2017-05-09 – 2017-05-11 (×7): 100 mg via INTRAVENOUS
  Filled 2017-05-08 (×9): qty 2

## 2017-05-08 MED ORDER — SODIUM CHLORIDE 0.9 % IV SOLN
1000.0000 mg | Freq: Two times a day (BID) | INTRAVENOUS | Status: DC
  Administered 2017-05-09 – 2017-05-11 (×5): 1000 mg via INTRAVENOUS
  Filled 2017-05-08 (×6): qty 10

## 2017-05-08 MED ORDER — SODIUM CHLORIDE 0.9 % IV SOLN
1300.0000 mg | Freq: Once | INTRAVENOUS | Status: AC
  Administered 2017-05-08: 1300 mg via INTRAVENOUS
  Filled 2017-05-08: qty 26

## 2017-05-08 MED ORDER — IOPAMIDOL (ISOVUE-300) INJECTION 61%
INTRAVENOUS | Status: AC
  Administered 2017-05-08: 50 mL
  Filled 2017-05-08: qty 150

## 2017-05-08 MED ORDER — SODIUM CHLORIDE 0.9 % IV SOLN
500.0000 mg | Freq: Two times a day (BID) | INTRAVENOUS | Status: DC
  Administered 2017-05-08: 500 mg via INTRAVENOUS
  Filled 2017-05-08 (×2): qty 5

## 2017-05-08 MED ORDER — FENTANYL CITRATE (PF) 100 MCG/2ML IJ SOLN
INTRAMUSCULAR | Status: AC
  Filled 2017-05-08: qty 2

## 2017-05-08 MED ORDER — LIDOCAINE HCL 1 % IJ SOLN
INTRAMUSCULAR | Status: AC
  Filled 2017-05-08: qty 20

## 2017-05-08 MED ORDER — FENTANYL CITRATE (PF) 100 MCG/2ML IJ SOLN
INTRAMUSCULAR | Status: AC | PRN
  Administered 2017-05-08: 25 ug via INTRAVENOUS

## 2017-05-08 NOTE — Progress Notes (Signed)
Hospitalist progress note   Carlos Vaughn  KGM:[id number] DOB: 1946-01-04 DOA: 05/07/2017 PCP: Caralyn Guile, DO   Specialists:   Brief Narrative:  7 fem extensive RLE DVT 02/2017, Myelodysplastic synd + Chr TCP + anemia,  Hydrocele surgery 2011 Headaches + worsening over 24 hrs,  Assessment & Plan:   Assessment:  The primary encounter diagnosis was Subdural hematoma ([place]). A diagnosis of Fall was also pertinent to this visit.  Subdural hemorrhage-appreciate neurosurgery input-patient will be monitored and seen by therapy eventually with regards to mobility-no current plans for any type of surgery Generalized weakness he had 2 falls one on Friday and then one on Saturday so there may be neurological component with chest pain secondary to some type of fracture-if his pain continues agree with cervical MRI Acute right lower extremity DVT-DVT is noted in the right lower extremity however patient was compliant on Lovenox-likely secondary to prothrombotic state of mild dysplasia-not a candidate for further coagulation but will need a filter and I have consulted IR today to consider the same Myelodysplasia C plus chronic thrombocytopenia/anemia-monitor counts and continue Jakafi as per [facility] hematologist Bipolar continue Seroquel 100 at bedtime from tomorrow ?  COPD no wheeze but can restart Combivent if needed and Atrovent Sacral fracture-probably nonoperative and would not do anything with regards to this except therapy and mobilization which we will monitor   Thigh-high hose, full code, inpatient, expect 1-2 more days hospital neurosurgery  Consultants:   Neurosurgery  Procedures:   None  Antimicrobials:   None  Subjective:  Awake alert in no distress oriented to some extent but then becomes sleepy  Objective: Vitals:   05/08/17 0012 05/08/17 0013 05/08/17 0100 05/08/17 0324  BP: (!) 109/54 119/90 (!) 115/55   Pulse: 74  68   Resp: 18  17   Temp: 97.7 F (36.5 C)   97.7 F  (36.5 C)  TempSrc: Oral   Oral  SpO2: 94%  91%   Weight:      Height:        Intake/Output Summary (Last 24 hours) at 05/08/2017 1046 Last data filed at 05/08/2017 0600 Gross per 24 hour  Intake 1354.17 ml  Output 350 ml  Net 1004.17 ml   Filed Weights   05/07/17 0852 05/07/17 2139  Weight: 70.3 kg (155 lb) 67.2 kg (148 lb 2.4 oz)    Examination: EOMI NCAT power 5/5 abdomen soft nontender nondistended Chest clear No rebound no guarding  Data Reviewed: I have personally reviewed following labs and imaging studies  CBC: Recent Labs  Lab 05/07/17 0941 05/07/17 1000  WBC 13.0*  --   NEUTROABS 11.9*  --   HGB 11.4* 11.9*  HCT 36.5* 35.0*  MCV 86.9  --   PLT 76*  --    Basic Metabolic Panel: Recent Labs  Lab 05/07/17 0941 05/07/17 1000  NA 136 139  K 4.0 4.0  CL 107 104  CO2 21*  --   GLUCOSE 148* 144*  BUN 12 11  CREATININE 0.89 0.80  CALCIUM 8.8*  --    GFR: Estimated Creatinine Clearance: 78.2 mL/min (by C-G formula based on SCr of 0.8 mg/dL). Liver Function Tests: Recent Labs  Lab 05/07/17 0941  AST 28  ALT 37  ALKPHOS 70  BILITOT 0.9  PROT 6.7  ALBUMIN 4.1   No results for input(s): LIPASE, AMYLASE in the last 168 hours. No results for input(s): AMMONIA in the last 168 hours. Coagulation Profile: No results for input(s): INR, PROTIME  in the last 168 hours. Cardiac Enzymes: Recent Labs  Lab 05/07/17 0941  CKTOTAL 36*   CBG: Recent Labs  Lab 05/07/17 0916  GLUCAP 131*   Urine analysis:    Component Value Date/Time   COLORURINE YELLOW 05/07/2017 1650   APPEARANCEUR CLEAR 05/07/2017 1650   LABSPEC 1.019 05/07/2017 1650   PHURINE 5.0 05/07/2017 1650   GLUCOSEU NEGATIVE 05/07/2017 1650   HGBUR NEGATIVE 05/07/2017 1650   BILIRUBINUR NEGATIVE 05/07/2017 1650   KETONESUR 20 (A) 05/07/2017 1650   PROTEINUR NEGATIVE 05/07/2017 1650   NITRITE NEGATIVE 05/07/2017 1650   LEUKOCYTESUR NEGATIVE 05/07/2017 1650     Radiology  Studies: Reviewed images personally in health database    Scheduled Meds: . atorvastatin  40 mg Oral q1800  . multivitamin with minerals  1 tablet Oral Daily  . prazosin  1 mg Oral QHS  . ruxolitinib phosphate  5 mg Oral BID  . senna  1 tablet Oral BID  . sodium chloride flush  3 mL Intravenous Q12H   Continuous Infusions: . sodium chloride    . sodium chloride 50 mL/hr at 05/07/17 2231     LOS: 1 day    Time spent: [place], MD [facility]   If 7PM-7AM, please contact night-coverage [email or web address] Password [facility] 05/08/2017, 10:46 AM

## 2017-05-08 NOTE — Progress Notes (Signed)
Admitted from [facility] via [place]. Arrived to unit 32m07 via stretcher. Pt A&OX3, breathing w/o difficulty. Move all extremity. Moderate strength in all extremity.  VS taken, placed on tele and continuous POX. MRSA swab and CHG bath completed. Pt C/O coccyx pain. Hospital was made aware and xray ordered.

## 2017-05-08 NOTE — Procedures (Signed)
Successful placement of infrarenal IVC filter ([place]).  IVC is patent.  Minimal blood loss and no immediate complication.

## 2017-05-08 NOTE — Progress Notes (Signed)
Chief Complaint: Patient was seen in consultation today for acute DVT, hemorrhagic stroke  Referring Physician(s): Dr. Verlon Au  Supervising Physician: [name]  Patient Status: [facility] - In-pt  History of Present Illness: Carlos Vaughn is a 72 y.o. male with COPD, myelofibrosis, thrombocytopenia, with known DVT in the right femoral vein previously on lovenox at home who was admitted after falls at home yesterday.   Patient found to have subdural hemorrhagic stroke.   CT Head 05/07/16 showed: IMPRESSION: 1. Smooth and lobulated acute appearing Subdural Hemorrhage along the medial left hemisphere tracking along the left falx and left tentorium. Hemorrhage lobulation up to 16 mm in thickness. 2. Etiology of #1 uncertain, with no superimposed skull fracture or other acute intracranial abnormality identified. Query coagulopathy. 3. Mild intracranial mass effect at this time. No midline shift and patent basilar cisterns.  IR consulted for IVC filter placement due to patient with known DVT and now unable to remain on oral anticoagulation.  Case reviewed with Dr. Anselm Pancoast.  Past Medical History:  Diagnosis Date  . COPD (chronic obstructive pulmonary disease) ([place])   . Myelofibrosis ([place])   . PTSD (post-traumatic stress disorder)     History reviewed. No pertinent surgical history.  Allergies: Patient has no known allergies.  Medications: Prior to Admission medications   Medication Sig Start Date End Date Taking? Authorizing Provider  enoxaparin (LOVENOX) 80 MG/0.8ML injection Inject 0.7 mLs (70 mg total) every 12 (twelve) hours for 9 days into the skin. Patient taking differently: Inject 1 mg/kg into the skin every 12 (twelve) hours. NOW PRESCRIBED EVERY 12 HOURS FOR AT LEAST 3 TO 6 MONTHS PER SPOUSE 02/24/17 05/07/17 Yes Quentin Cornwall, [place] N, PA-C  ibuprofen (ADVIL,MOTRIN) 200 MG tablet Take 400 mg by mouth 2 (two) times daily as needed for moderate pain.    Yes [provider]   Ipratropium-Albuterol (COMBIVENT RESPIMAT) 20-100 MCG/ACT AERS respimat Inhale 1 puff into the lungs 4 (four) times daily.   Yes [provider]  Multiple Vitamin (MULTIVITAMIN WITH MINERALS) TABS tablet Take 1 tablet by mouth daily.   Yes [provider]  prazosin (MINIPRESS) 2 MG capsule Take 1 mg at bedtime by mouth.    Yes [provider]  QUEtiapine (SEROQUEL) 100 MG tablet Take 100-200 tablets at bedtime by mouth.    Yes [provider]  ruxolitinib phosphate (JAKAFI) 5 MG tablet Take 5 mg by mouth 2 (two) times daily.   Yes [provider]  simvastatin (ZOCOR) 80 MG tablet Take 40 mg by mouth at bedtime.   Yes [provider]  albuterol (PROVENTIL HFA;VENTOLIN HFA) 108 (90 Base) MCG/ACT inhaler Inhale 1-2 puffs into the lungs every 6 (six) hours as needed for wheezing. Patient not taking: Reported on 02/24/2017 08/21/16   Tanna Furry, MD  doxycycline (VIBRAMYCIN) 100 MG capsule Take 1 capsule (100 mg total) by mouth 2 (two) times daily. Patient not taking: Reported on 02/24/2017 10/10/16   Duffy Bruce, MD  HYDROcodone-homatropine [facility]) 5-1.5 MG/5ML syrup Take 5 mLs by mouth every 6 (six) hours as needed for cough. Patient not taking: Reported on 02/24/2017 10/10/16   Duffy Bruce, MD  ipratropium (ATROVENT HFA) 17 MCG/ACT inhaler Inhale 2 puffs into the lungs 2 (two) times daily. Patient not taking: Reported on 02/24/2017 08/21/16   Tanna Furry, MD     No family history on file.  Social History   Socioeconomic History  . Marital status: Married    Spouse name: None  . Number of  children: None  . Years of education: None  . Highest education level: None  Social Needs  . Financial resource strain: None  . Food insecurity - worry: None  . Food insecurity - inability: None  . Transportation needs - medical: None  . Transportation needs - non-medical: None  Occupational History  . None  Tobacco Use  . Smoking status: Former  Research scientist (life sciences)  . Smokeless tobacco: Never Used  Substance and Sexual Activity  . Alcohol use: No  . Drug use: No  . Sexual activity: Yes  Other Topics Concern  . None  Social History Narrative  . None    Review of Systems  Constitutional: Positive for fatigue. Negative for fever.  Respiratory: Negative for cough and shortness of breath.   Cardiovascular: Negative for chest pain.  Gastrointestinal: Negative for abdominal pain.  Musculoskeletal: Negative for back pain.  Neurological: Negative for headaches.  Psychiatric/Behavioral: Negative for behavioral problems and confusion.    Vital Signs: BP 126/65 (BP Location: Left Arm)   Pulse 70   Temp 97.7 F (36.5 C) (Oral)   Resp 15   Ht 5\' 8"  (1.727 m)   Wt 148 lb 2.4 oz (67.2 kg)   SpO2 93%   BMI 22.53 kg/m   Physical Exam  Constitutional: He is oriented to person, place, and time. He appears well-developed.  Cardiovascular: Normal rate, regular rhythm and normal heart sounds.  Pulmonary/Chest: Effort normal and breath sounds normal. No respiratory distress.  Abdominal: Soft.  Neurological: He is alert and oriented to person, place, and time.  Skin: Skin is warm and dry.  Psychiatric: He has a normal mood and affect. His behavior is normal. Judgment and thought content normal.  Nursing note and vitals reviewed.   Imaging: Dg Sacrum/coccyx  Result Date: 05/08/2017 CLINICAL DATA:  72 year old male status post fall yesterday with tail bone pain. EXAM: SACRUM AND COCCYX - 2+ VIEW COMPARISON:  No prior pelvis radiographs. FINDINGS: On the lateral view the lower lumbar and upper sacral segments appear intact, but there is a fracture deformity through the S5 sacral segment with mild anterior angulation and displacement. The coccygeal segments appear to remain intact. SI joints appear intact. Remaining visible pelvis appears intact. IMPRESSION: Mildly angulated and displaced lower sacral fracture at S5. The upper sacrum and both SI  joints appear intact. Electronically Signed   By: Genevie Ann M.D.   On: 05/08/2017 09:47   Ct Head Wo Contrast  Result Date: 05/08/2017 CLINICAL DATA:  72 year old male with parasagittal subdural hematoma diagnosed on CT yesterday. Presented with progressive weakness. Personal history of myelodysplastic syndrome, was on Lovenox which was stopped. EXAM: CT HEAD WITHOUT CONTRAST TECHNIQUE: Contiguous axial images were obtained from the base of the skull through the vertex without intravenous contrast. COMPARISON:  Head CT 05/07/2017. FINDINGS: Brain: Left parafalcine and tentorial subdural blood redemonstrated. The hemorrhage remains predominantly hyperintense. Lobulated left parafalcine component up to 15 millimeters in thickness above the body of the lateral ventricles is stable. Parafalcine blood thickness generally 7-8 millimeters or less elsewhere. The oval lobulated component along the left posterior tentorium is stable to 1 millimeter larger, up to 17 millimeters in thickness now. As before no peripheral subdural blood is identified. Mild mass effect on the left hemisphere and left lateral ventricle with no midline shift. No ventriculomegaly. Basilar cisterns remain normal. No new intracranial hemorrhage. Stable gray-white matter differentiation throughout the brain. No cortically based acute infarct identified. Vascular: Calcified atherosclerosis at the skull base. No suspicious intracranial  vascular hyperdensity. Skull: Bone mineralization within normal limits. No skull fracture or No acute osseous abnormality identified. Sinuses/Orbits: Visualized paranasal sinuses and mastoids are stable and well pneumatized. Other: Visualized orbits and scalp soft tissues are within normal limits. IMPRESSION: 1. Stable to minimally increased volume of hyperdense left parafalcine and left tentorial subdural hematoma since 0933 hours yesterday. 2. Stable mild regional mass effect, with no midline shift or ventriculomegaly.  3. No new intracranial abnormality. Electronically Signed   By: Genevie Ann M.D.   On: 05/08/2017 07:14   Ct Head Wo Contrast  Addendum Date: 05/07/2017   ADDENDUM REPORT: 05/07/2017 10:40 ADDENDUM: Critical Value/emergent results were called by telephone at the time of interpretation on 05/07/2017 At 1011 hours to Dr. Quintella Reichert , who verbally acknowledged these results. Electronically Signed   By: Genevie Ann M.D.   On: 05/07/2017 10:40   Result Date: 05/07/2017 CLINICAL DATA:  72 year old male with weakness beginning 1 week ago, progressive symptoms for 2 days. EXAM: CT HEAD WITHOUT CONTRAST TECHNIQUE: Contiguous axial images were obtained from the base of the skull through the vertex without intravenous contrast. COMPARISON:  None. FINDINGS: Brain: Lobulated and mixed density but mostly hyperdense subdural hemorrhage left paracentral along the falx and left tentorium. A thin strip of hemorrhage mostly 4-5 millimeters in areas, but with focal lobulated components up to the 14 or 16 millimeters in thickness (series 2, image 22 and coronal image 52). No superimposed peripheral extra-axial hemorrhage is identified. No definite posterior fossa blood. No subarachnoid, parenchymal, or intraventricular hemorrhage. Mild mass effect on the left hemisphere including mild effacement of the left lateral ventricle occipital horn and atrium. No midline shift or ventriculomegaly. Patent basilar cisterns. No cortically based acute infarct identified. Gray-white matter differentiation appears within normal limits for age throughout the brain. Vascular: Calcified atherosclerosis at the skull base. Skull: No skull fracture identified. Visible osseous structures appear normal. Sinuses/Orbits: Clear. Other: Visualized orbits and scalp soft tissues are within normal limits. IMPRESSION: 1. Smooth and lobulated acute appearing Subdural Hemorrhage along the medial left hemisphere tracking along the left falx and left tentorium.  Hemorrhage lobulation up to 16 mm in thickness. 2. Etiology of #1 uncertain, with no superimposed skull fracture or other acute intracranial abnormality identified. Query coagulopathy. 3. Mild intracranial mass effect at this time. No midline shift and patent basilar cisterns. Electronically Signed: By: Genevie Ann M.D. On: 05/07/2017 10:11   Dg Chest Port 1 View  Result Date: 05/07/2017 CLINICAL DATA:  Pain following fall EXAM: PORTABLE CHEST 1 VIEW COMPARISON:  October 10, 2016 and Aug 21, 2016 FINDINGS: Previous airspace consolidation the right has cleared. There is mild scarring in the right mid lung. Lungs elsewhere clear. Heart is upper normal in size with pulmonary vascularity normal. There is aortic atherosclerosis. No adenopathy. No bone lesions. No evident pneumothorax. IMPRESSION: Mild scarring right mid lung. No edema or consolidation. There is aortic atherosclerosis. Aortic Atherosclerosis (ICD10-I70.0). Electronically Signed   By: Lowella Grip III M.D.   On: 05/07/2017 10:11    Labs:  CBC: Recent Labs    08/21/16 2039 10/10/16 1936 02/24/17 1017 05/07/17 0941 05/07/17 1000  WBC 18.2* 24.1* 17.5* 13.0*  --   HGB 16.1 14.4 19.7* 11.4* 11.9*  HCT 51.4 45.5 61.6* 36.5* 35.0*  PLT 115* 211 71* 76*  --     COAGS: No results for input(s): INR, APTT in the last 8760 hours.  BMP: Recent Labs    08/21/16 2039 10/10/16 1936 02/24/17 1017  05/07/17 0941 05/07/17 1000  NA 139 136 140 136 139  K 4.7 4.4 4.7 4.0 4.0  CL 108 101 105 107 104  CO2 26 27 25  21*  --   GLUCOSE 105* 99 84 148* 144*  BUN 16 14 20 12 11   CALCIUM 9.0 8.7* 9.2 8.8*  --   CREATININE 0.92 0.95 1.18 0.89 0.80  GFRNONAA >60 >60 59* >60  --   GFRAA >60 >60 >60 >60  --     LIVER FUNCTION TESTS: Recent Labs    05/07/17 0941  BILITOT 0.9  AST 28  ALT 37  ALKPHOS 70  PROT 6.7  ALBUMIN 4.1    TUMOR MARKERS: No results for input(s): AFPTM, CEA, CA199, CHROMGRNA in the last 8760 hours.  Assessment  and Plan: Right femoral DVT, hemorrhagic stroke Patient with history of known DVT.  Preliminary findings from LE doppler today shows ongoing acute DVT in right femoral vein with subacute in popliteal, posterior tibial, and peroneal veins.  In light of hemorrhagic stroke, will not be able to remain on oral anticoagulation at this time.  IVC filter is indicated.  Will proceed with placement today.  Risks and benefits discussed with the patient including, but not limited to bleeding, infection, contrast induced renal failure, filter fracture or migration which can lead to emergency surgery or even death, strut penetration with damage or irritation to adjacent structures and caval thrombosis. All of the patient's questions were answered, patient is agreeable to proceed. Consent signed and in chart.  Thank you for this interesting consult.  I greatly enjoyed meeting Carlos Vaughn and look forward to participating in their care.  A copy of this report was sent to the requesting provider on this date.  Electronically Signed: Docia Barrier, PA 05/08/2017, 1:03 PM   I spent a total of 40 Minutes    in face to face in clinical consultation, greater than 50% of which was counseling/coordinating care for IVC filter

## 2017-05-08 NOTE — Progress Notes (Signed)
Was helping patient on bed pan when patient began to have right arm jerking movements that continued non stop for about 20-25 min.  Paged neurology, consult pending. Will continue to monitor.  2140: Dr. Lorraine Lax in room with patient New orders given.

## 2017-05-08 NOTE — Plan of Care (Signed)
  Pain Managment: General experience of comfort will improve 05/08/2017 0113 - Progressing by Irish Lack, RN   Health Behavior/Discharge Planning: Ability to manage health-related needs will improve 05/08/2017 0113 - Progressing by Irish Lack, RN

## 2017-05-08 NOTE — Consult Note (Addendum)
Neurology Consultation Reason for Consult: Right arm tremors Referring Physician: Dr. Verneita Griffes    History is obtained from: patient and chart review   HPI: Carlos Vaughn is a 72 y.o. male Caucasian with past history of DVT on Lovenox, myelodysplastic syndrome with chronic thrombocytopenia, chronic anemia presents to the ER on 1.19 with headache, unsteady gait and recurrent falls. A CT head was performed which showed a medial left hemispheric subdural hemorrhage tracking along the left falx and tentorium. His Lovenox was discontinued and IVC filter was placed today for his right leg DVT. Around 8 PM, patient started having right arm jerking movements that continued for about 20-25 minutes. Resolved spontaneously. Patient was on 500 mg Keppra twice a day started for prophylaxis    ROS: A 14 point ROS was performed and is negative except as noted in the HPI.   Past Medical History:  Diagnosis Date  . COPD (chronic obstructive pulmonary disease) ([place])   . Myelofibrosis ([place])   . PTSD (post-traumatic stress disorder)      No family history on file.   Social History:  reports that he has quit smoking. he has never used smokeless tobacco. He reports that he does not drink alcohol or use drugs.   Exam: Current vital signs: BP (!) 117/57 (BP Location: Right Arm)   Pulse 69   Temp 97.9 F (36.6 C) (Oral)   Resp 18   Ht 5\' 8"  (1.727 m)   Wt 67.2 kg (148 lb 2.4 oz)   SpO2 90%   BMI 22.53 kg/m  Vital signs in last 24 hours: Temp:  [97.7 F (36.5 C)-98.2 F (36.8 C)] 97.9 F (36.6 C) (01/21 0400) Pulse Rate:  [65-90] 69 (01/21 0400) Resp:  [12-26] 18 (01/21 0400) BP: (91-141)/(43-76) 117/57 (01/21 0400) SpO2:  [85 %-96 %] 90 % (01/21 0400)   Physical Exam  Constitutional: Appears well-developed and well-nourished.  Psych: Affect appropriate to situation Eyes: No scleral injection HENT: No OP obstrucion Head: Normocephalic.  Cardiovascular: Normal rate and regular rhythm.   Respiratory: Effort normal, non-labored breathing GI: Soft.  No distension. There is no tenderness.  Skin: WDI  Neuro: Mental Status: Patient is awake, alert, oriented to person, place, month, year, and situation. Patient is able to give a clear and coherent history No signs of aphasia or neglect Cranial Nerves: II: Visual Fields are full. Pupils are equal, round, and reactive to light.   III,IV, VI: EOMI without ptosis or diploplia.  V: Facial sensation is symmetric to temperature VII: Facial movement is symmetric.  VIII: hearing is intact to voice X: Uvula elevates symmetrically XI: Shoulder shrug is symmetric. XII: tongue is midline without atrophy or fasciculations.  Motor: Tone is normal. Bulk is normal. Patient has 4 /5 strength in his right upper extremity and about 3+ by 5 strength in his right lower extremity.  Sensory: Sensation is symmetric to light touch and temperature in the arms and legs. Deep Tendon Reflexes: 2+ and symmetric in the biceps and patellae.  Plantars: Toes are downgoing bilaterally.  Cerebellar: FNF and HKS are intact bilaterally    I have reviewed labs in epic and the results pertinent to this consultation are: Platelets borderline low at 76, leukocytosis   I have reviewed the images obtained: Stable subdural hematoma or the falx  ASSESSMENT AND PLAN    72 y.o. male Caucasian with past history of DVT on Lovenox, myelodysplastic syndrome with chronic thrombocytopenia, chronic anemia presents to the ER with left-sided falcine subdural  hemorrhage. Repeat head CT today stable. Right arm jerking for 20 min that began at 20.09 and lasted for 20 min.  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 Discontinue in 48 - 72 hrs , if he has remained seizure-free as phenytoin has more interaction with medications and continue Keppra. Will increase  maintenance dose of keppra to 1g BID Will obtain routine EEG in the morning ( nonurgent, unless he clinically seizes again)  Ativan when necessary seizure greater than 2 minutes, call neurology MD stat Seizure precautions include no driving at discharge x 6 months    Will follow.   Karena Addison Darcelle Herrada MD [facility] [phone]  If 7pm to 7am, please call on call as listed on AMION.

## 2017-05-08 NOTE — Progress Notes (Signed)
Pt yelling "help help" inside room. RN goes  into room and finds pt exhibiting seizure-like activity, although pt was completely alert and able to respond appropriately. Dr. Verlon Au notified and states will come to bedside.

## 2017-05-08 NOTE — Progress Notes (Signed)
Dr. Verlon Au at pt bedside

## 2017-05-08 NOTE — Progress Notes (Signed)
Subjective: The patient is alert and pleasant.  He is in no apparent distress.  His daughter is at the bedside.  He continues to complain of generalized weakness.  He continues to deny neck pain, radicular arm pain, etc.  Objective: Vital signs in last 24 hours: Temp:  [97.5 F (36.4 C)-98.2 F (36.8 C)] 97.7 F (36.5 C) (01/20 0324) Pulse Rate:  [66-80] 68 (01/20 0100) Resp:  [15-23] 17 (01/20 0100) BP: (106-133)/(51-90) 115/55 (01/20 0100) SpO2:  [91 %-98 %] 91 % (01/20 0100) Weight:  [67.2 kg (148 lb 2.4 oz)] 67.2 kg (148 lb 2.4 oz) (01/19 2139) Estimated body mass index is 22.53 kg/m as calculated from the following:   Height as of this encounter: 5\' 8"  (1.727 m).   Weight as of this encounter: 67.2 kg (148 lb 2.4 oz).   Intake/Output from previous day: 01/19 0701 - 01/20 0700 In: 1354.2 [P.O.:120; I.V.:234.2; IV Piggyback:1000] Out: 350 [Urine:350] Intake/Output this shift: No intake/output data recorded.  Physical exam the patient is alert and oriented x3.  He is moving all 4 extremities well.  I have reviewed the patient's follow-up head CT performed [facility].  It does not demonstrate any significant change from his prior study yesterday.  Lab Results: Recent Labs    05/07/17 0941 05/07/17 1000  WBC 13.0*  --   HGB 11.4* 11.9*  HCT 36.5* 35.0*  PLT 76*  --    BMET Recent Labs    05/07/17 0941 05/07/17 1000  NA 136 139  K 4.0 4.0  CL 107 104  CO2 21*  --   GLUCOSE 148* 144*  BUN 12 11  CREATININE 0.89 0.80  CALCIUM 8.8*  --     Studies/Results: Dg Sacrum/coccyx  Result Date: 05/08/2017 CLINICAL DATA:  72 year old male status post fall yesterday with tail bone pain. EXAM: SACRUM AND COCCYX - 2+ VIEW COMPARISON:  No prior pelvis radiographs. FINDINGS: On the lateral view the lower lumbar and upper sacral segments appear intact, but there is a fracture deformity through the S5 sacral segment with mild anterior angulation and  displacement. The coccygeal segments appear to remain intact. SI joints appear intact. Remaining visible pelvis appears intact. IMPRESSION: Mildly angulated and displaced lower sacral fracture at S5. The upper sacrum and both SI joints appear intact. Electronically Signed   By: Genevie Ann M.D.   On: 05/08/2017 09:47   Ct Head Wo Contrast  Result Date: 05/08/2017 CLINICAL DATA:  72 year old male with parasagittal subdural hematoma diagnosed on CT yesterday. Presented with progressive weakness. Personal history of myelodysplastic syndrome, was on Lovenox which was stopped. EXAM: CT HEAD WITHOUT CONTRAST TECHNIQUE: Contiguous axial images were obtained from the base of the skull through the vertex without intravenous contrast. COMPARISON:  Head CT 05/07/2017. FINDINGS: Brain: Left parafalcine and tentorial subdural blood redemonstrated. The hemorrhage remains predominantly hyperintense. Lobulated left parafalcine component up to 15 millimeters in thickness above the body of the lateral ventricles is stable. Parafalcine blood thickness generally 7-8 millimeters or less elsewhere. The oval lobulated component along the left posterior tentorium is stable to 1 millimeter larger, up to 17 millimeters in thickness now. As before no peripheral subdural blood is identified. Mild mass effect on the left hemisphere and left lateral ventricle with no midline shift. No ventriculomegaly. Basilar cisterns remain normal. No new intracranial hemorrhage. Stable gray-white matter differentiation throughout the brain. No cortically based acute infarct identified. Vascular: Calcified atherosclerosis at the skull base. No suspicious intracranial vascular hyperdensity.  Skull: Bone mineralization within normal limits. No skull fracture or No acute osseous abnormality identified. Sinuses/Orbits: Visualized paranasal sinuses and mastoids are stable and well pneumatized. Other: Visualized orbits and scalp soft tissues are within normal limits.  IMPRESSION: 1. Stable to minimally increased volume of hyperdense left parafalcine and left tentorial subdural hematoma since 0933 hours yesterday. 2. Stable mild regional mass effect, with no midline shift or ventriculomegaly. 3. No new intracranial abnormality. Electronically Signed   By: Genevie Ann M.D.   On: 05/08/2017 07:14   Ct Head Wo Contrast  Addendum Date: 05/07/2017   ADDENDUM REPORT: 05/07/2017 10:40 ADDENDUM: Critical Value/emergent results were called by telephone at the time of interpretation on 05/07/2017 At 1011 hours to Dr. Quintella Reichert , who verbally acknowledged these results. Electronically Signed   By: Genevie Ann M.D.   On: 05/07/2017 10:40   Result Date: 05/07/2017 CLINICAL DATA:  72 year old male with weakness beginning 1 week ago, progressive symptoms for 2 days. EXAM: CT HEAD WITHOUT CONTRAST TECHNIQUE: Contiguous axial images were obtained from the base of the skull through the vertex without intravenous contrast. COMPARISON:  None. FINDINGS: Brain: Lobulated and mixed density but mostly hyperdense subdural hemorrhage left paracentral along the falx and left tentorium. A thin strip of hemorrhage mostly 4-5 millimeters in areas, but with focal lobulated components up to the 14 or 16 millimeters in thickness (series 2, image 22 and coronal image 52). No superimposed peripheral extra-axial hemorrhage is identified. No definite posterior fossa blood. No subarachnoid, parenchymal, or intraventricular hemorrhage. Mild mass effect on the left hemisphere including mild effacement of the left lateral ventricle occipital horn and atrium. No midline shift or ventriculomegaly. Patent basilar cisterns. No cortically based acute infarct identified. Gray-white matter differentiation appears within normal limits for age throughout the brain. Vascular: Calcified atherosclerosis at the skull base. Skull: No skull fracture identified. Visible osseous structures appear normal. Sinuses/Orbits: Clear. Other:  Visualized orbits and scalp soft tissues are within normal limits. IMPRESSION: 1. Smooth and lobulated acute appearing Subdural Hemorrhage along the medial left hemisphere tracking along the left falx and left tentorium. Hemorrhage lobulation up to 16 mm in thickness. 2. Etiology of #1 uncertain, with no superimposed skull fracture or other acute intracranial abnormality identified. Query coagulopathy. 3. Mild intracranial mass effect at this time. No midline shift and patent basilar cisterns. Electronically Signed: By: Genevie Ann M.D. On: 05/07/2017 10:11   Dg Chest Port 1 View  Result Date: 05/07/2017 CLINICAL DATA:  Pain following fall EXAM: PORTABLE CHEST 1 VIEW COMPARISON:  October 10, 2016 and Aug 21, 2016 FINDINGS: Previous airspace consolidation the right has cleared. There is mild scarring in the right mid lung. Lungs elsewhere clear. Heart is upper normal in size with pulmonary vascularity normal. There is aortic atherosclerosis. No adenopathy. No bone lesions. No evident pneumothorax. IMPRESSION: Mild scarring right mid lung. No edema or consolidation. There is aortic atherosclerosis. Aortic Atherosclerosis (ICD10-I70.0). Electronically Signed   By: Lowella Grip III M.D.   On: 05/07/2017 10:11    Assessment/Plan: Left subdural hematoma, Lovenox anticoagulation, generalized weakness: I have discussed the situation with the patient and his daughter.  We will need to hold anticoagulation for the foreseeable future.  He does not need surgery.  I will ask physical therapy to see the patient and mobilize him.  If he continues to complain of generalized weakness we may consider getting a cervical MRI, although my clinical suspicion is low since he is not having cervical symptoms.  Lower  extremity deep venous thrombosis: He may need a IVC filter.  LOS: 1 day     Carlos Vaughn 05/08/2017, 10:02 AM     Patient ID: Carlos Vaughn, male   DOB: [birth year], 72 y.o.   MRN: [id number]

## 2017-05-08 NOTE — Progress Notes (Signed)
MEDICATION RELATED CONSULT NOTE - FOLLOW UP Pharmacy Consult for IV Phenytoin  Indication: Seizures   No Known Allergies  Patient Measurements: Height: 5\' 8"  (172.7 cm) Weight: 148 lb 2.4 oz (67.2 kg) IBW/kg (Calculated) : 68.4  Vital Signs: Temp: 98 F (36.7 C) (01/20 1939) Temp Source: Oral (01/20 1939) BP: 118/65 (01/20 2124) Pulse Rate: 76 (01/20 2000)  Labs: Recent Labs    05/07/17 0941 05/07/17 1000  WBC 13.0*  --   HGB 11.4* 11.9*  HCT 36.5* 35.0*  PLT 76*  --   CREATININE 0.89 0.80  ALBUMIN 4.1  --   PROT 6.7  --   AST 28  --   ALT 37  --   ALKPHOS 70  --   BILITOT 0.9  --    Estimated Creatinine Clearance: 78.2 mL/min (by C-G formula based on SCr of 0.8 mg/dL).   Microbiology: Recent Results (from the past 720 hour(s))  MRSA PCR Screening     Status: None   Collection Time: 05/07/17  9:32 PM  Result Value Ref Range Status   MRSA by PCR NEGATIVE NEGATIVE Final    Comment:        The GeneXpert MRSA Assay (FDA approved for NASAL specimens only), is one component of a comprehensive MRSA colonization surveillance program. It is not intended to diagnose MRSA infection nor to guide or monitor treatment for MRSA infections.    Assessment: Starting IV Phenytoin maintenance dosing with concern for focal seizures. Receiving fosphenytoin load. Renal function good. Albumin 4.1.   Goal of Therapy:  Phenytoin level 10-20 mcg/mL  Plan:  Fosphenytoin 1300 mg PE IV x 1 per MD Start phenytoin 100 mg IV q8h  Check phenytoin level/albumin in ~72 hours Would recommend change from Lipitor to Crestor  Narda Bonds 05/08/2017,9:55 PM

## 2017-05-08 NOTE — Progress Notes (Signed)
LE venous duplex prelim: Acute DVT noted in the right femoral vein. Subacute DVT noted in the right popliteal, posterior tibial, and peroneal veins. No DVT LLE.  Landry Mellow, RDMS, RVT

## 2017-05-09 ENCOUNTER — Inpatient Hospital Stay (HOSPITAL_COMMUNITY): Payer: Medicare PPO

## 2017-05-09 DIAGNOSIS — S065X9A Traumatic subdural hemorrhage with loss of consciousness of unspecified duration, initial encounter: Secondary | ICD-10-CM

## 2017-05-09 DIAGNOSIS — I824Y9 Acute embolism and thrombosis of unspecified deep veins of unspecified proximal lower extremity: Secondary | ICD-10-CM

## 2017-05-09 DIAGNOSIS — S065XAA Traumatic subdural hemorrhage with loss of consciousness status unknown, initial encounter: Secondary | ICD-10-CM

## 2017-05-09 DIAGNOSIS — D638 Anemia in other chronic diseases classified elsewhere: Secondary | ICD-10-CM

## 2017-05-09 DIAGNOSIS — I6200 Nontraumatic subdural hemorrhage, unspecified: Principal | ICD-10-CM

## 2017-05-09 DIAGNOSIS — F4310 Post-traumatic stress disorder, unspecified: Secondary | ICD-10-CM

## 2017-05-09 DIAGNOSIS — I824Z9 Acute embolism and thrombosis of unspecified deep veins of unspecified distal lower extremity: Secondary | ICD-10-CM

## 2017-05-09 DIAGNOSIS — R569 Unspecified convulsions: Secondary | ICD-10-CM

## 2017-05-09 DIAGNOSIS — J449 Chronic obstructive pulmonary disease, unspecified: Secondary | ICD-10-CM

## 2017-05-09 DIAGNOSIS — G40909 Epilepsy, unspecified, not intractable, without status epilepticus: Secondary | ICD-10-CM

## 2017-05-09 DIAGNOSIS — D696 Thrombocytopenia, unspecified: Secondary | ICD-10-CM

## 2017-05-09 DIAGNOSIS — Z86718 Personal history of other venous thrombosis and embolism: Secondary | ICD-10-CM

## 2017-05-09 DIAGNOSIS — D72829 Elevated white blood cell count, unspecified: Secondary | ICD-10-CM

## 2017-05-09 MED ORDER — ORAL CARE MOUTH RINSE
15.0000 mL | Freq: Two times a day (BID) | OROMUCOSAL | Status: DC
  Administered 2017-05-10 – 2017-05-12 (×3): 15 mL via OROMUCOSAL

## 2017-05-09 MED ORDER — CHLORHEXIDINE GLUCONATE 0.12 % MT SOLN
15.0000 mL | Freq: Two times a day (BID) | OROMUCOSAL | Status: DC
  Administered 2017-05-10 – 2017-05-13 (×6): 15 mL via OROMUCOSAL
  Filled 2017-05-09 (×6): qty 15

## 2017-05-09 NOTE — Progress Notes (Signed)
Patient ID: Carlos Vaughn, male   DOB: [birth year], 72 y.o.   MRN: [id number] Subjective: The patient is alert and pleasant.  By report he had a few approximately 20-minute left brain focal seizure involving jerking of his hand.   Objective: Vital signs in last 24 hours: Temp:  [97.6 F (36.4 C)-98.4 F (36.9 C)] 97.9 F (36.6 C) (01/21 1601) Pulse Rate:  [65-90] 66 (01/21 1601) Resp:  [12-23] 19 (01/21 1601) BP: (91-141)/(43-71) 103/65 (01/21 1601) SpO2:  [85 %-96 %] 94 % (01/21 1601) Estimated body mass index is 22.53 kg/m as calculated from the following:   Height as of this encounter: 5\' 8"  (1.727 m).   Weight as of this encounter: 67.2 kg (148 lb 2.4 oz).   Intake/Output from previous day: 01/20 0701 - 01/21 0700 In: 2021 [I.V.:1806; IV Piggyback:215] Out: 1150 [Urine:1150] Intake/Output this shift: Total I/O In: 300 [P.O.:300] Out: -   Physical exam the patient is alert and oriented x3.  He is moving all 4 extremities well.  Lab Results: Recent Labs    05/07/17 0941 05/07/17 1000  WBC 13.0*  --   HGB 11.4* 11.9*  HCT 36.5* 35.0*  PLT 76*  --    BMET Recent Labs    05/07/17 0941 05/07/17 1000  NA 136 139  K 4.0 4.0  CL 107 104  CO2 21*  --   GLUCOSE 148* 144*  BUN 12 11  CREATININE 0.89 0.80  CALCIUM 8.8*  --     Studies/Results: Dg Sacrum/coccyx  Result Date: 05/08/2017 CLINICAL DATA:  72 year old male status post fall yesterday with tail bone pain. EXAM: SACRUM AND COCCYX - 2+ VIEW COMPARISON:  No prior pelvis radiographs. FINDINGS: On the lateral view the lower lumbar and upper sacral segments appear intact, but there is a fracture deformity through the S5 sacral segment with mild anterior angulation and displacement. The coccygeal segments appear to remain intact. SI joints appear intact. Remaining visible pelvis appears intact. IMPRESSION: Mildly angulated and displaced lower sacral fracture at S5. The upper sacrum and both SI joints appear intact.  Electronically Signed   By: Genevie Ann M.D.   On: 05/08/2017 09:47   Ct Head Wo Contrast  Result Date: 05/08/2017 CLINICAL DATA:  Subdural hemorrhage follow-up EXAM: CT HEAD WITHOUT CONTRAST TECHNIQUE: Contiguous axial images were obtained from the base of the skull through the vertex without intravenous contrast. COMPARISON:  CT head 05/08/2017 FINDINGS: Brain: Left-sided interhemispheric subdural hematoma unchanged measuring up to 13 mm in thickness. Large left tentorial subdural hematoma also unchanged measuring up to 16 mm in thickness. No new area of hemorrhage. No subarachnoid hemorrhage. Generalized atrophy and chronic microvascular ischemia. No acute infarct. No midline shift. Negative for mass lesion. Vascular: Negative for hyperdense vessel Skull: Negative Sinuses/Orbits: Mild mucosal edema paranasal sinuses. Negative orbit Other: None IMPRESSION: Subdural hematoma along the falx and tentorium on the left unchanged from earlier today. No midline shift. No new hemorrhage. Electronically Signed   By: Franchot Gallo M.D.   On: 05/08/2017 18:35   Ct Head Wo Contrast  Result Date: 05/08/2017 CLINICAL DATA:  72 year old male with parasagittal subdural hematoma diagnosed on CT yesterday. Presented with progressive weakness. Personal history of myelodysplastic syndrome, was on Lovenox which was stopped. EXAM: CT HEAD WITHOUT CONTRAST TECHNIQUE: Contiguous axial images were obtained from the base of the skull through the vertex without intravenous contrast. COMPARISON:  Head CT 05/07/2017. FINDINGS: Brain: Left parafalcine and tentorial subdural blood redemonstrated. The hemorrhage remains predominantly  hyperintense. Lobulated left parafalcine component up to 15 millimeters in thickness above the body of the lateral ventricles is stable. Parafalcine blood thickness generally 7-8 millimeters or less elsewhere. The oval lobulated component along the left posterior tentorium is stable to 1 millimeter larger, up  to 17 millimeters in thickness now. As before no peripheral subdural blood is identified. Mild mass effect on the left hemisphere and left lateral ventricle with no midline shift. No ventriculomegaly. Basilar cisterns remain normal. No new intracranial hemorrhage. Stable gray-white matter differentiation throughout the brain. No cortically based acute infarct identified. Vascular: Calcified atherosclerosis at the skull base. No suspicious intracranial vascular hyperdensity. Skull: Bone mineralization within normal limits. No skull fracture or No acute osseous abnormality identified. Sinuses/Orbits: Visualized paranasal sinuses and mastoids are stable and well pneumatized. Other: Visualized orbits and scalp soft tissues are within normal limits. IMPRESSION: 1. Stable to minimally increased volume of hyperdense left parafalcine and left tentorial subdural hematoma since 0933 hours yesterday. 2. Stable mild regional mass effect, with no midline shift or ventriculomegaly. 3. No new intracranial abnormality. Electronically Signed   By: Genevie Ann M.D.   On: 05/08/2017 07:14   Ir Ivc Filter Plmt / S&i /img Guid/mod Sed  Result Date: 05/08/2017 INDICATION: 72 year old with intracranial hemorrhage. Right lower extremity DVT. Patient is not an anticoagulation candidate at this time. Patient needs IVC filter for pulmonary embolism prevention. EXAM: IVC FILTER PLACEMENT; IVC VENOGRAM; ULTRASOUND FOR VASCULAR ACCESS Physician: Stephan Minister. Anselm Pancoast, MD MEDICATIONS: None. ANESTHESIA/SEDATION: Fentanyl 25 mcg IV; Versed 1.0 mg IV Moderate Sedation Time:  14 minutes The patient was continuously monitored during the procedure by the interventional radiology nurse under my direct supervision. CONTRAST:  50 mL Isovue FLUOROSCOPY TIME:  Fluoroscopy Time: 1 minutes 42 seconds (39 mGy). COMPLICATIONS: None immediate. PROCEDURE: Informed consent was obtained for an IVC venogram and filter placement. Ultrasound demonstrated a patent right common  femoral vein. Ultrasound images were obtained for documentation. The right groin was prepped and draped in a sterile fashion. Maximal barrier sterile technique was utilized including caps, mask, sterile gowns, sterile gloves, sterile drape, hand hygiene and skin antiseptic. The skin was anesthetized with 1% lidocaine. A 21 gauge needle was directed into the vein with ultrasound guidance and a micropuncture dilator set was placed. A wire was advanced into the IVC. The filter sheath was advanced over the wire into the IVC. An IVC venogram was performed. Fluoroscopic images were obtained for documentation. A Bard Denali filter was deployed below the lowest renal vein. A follow-up venogram was performed and the vascular sheath was removed with manual compression. FINDINGS: IVC was patent. Bilateral renal veins were identified. Infrarenal inferior vena cava measured 24 mm. The filter was deployed below the lowest renal vein. Follow-up venogram confirmed placement within the IVC and below the renal veins. IMPRESSION: Successful placement of a retrievable IVC filter. PLAN: This IVC filter is potentially retrievable. The patient will be assessed for filter retrieval by Interventional Radiology in approximately 8-12 weeks. Further recommendations regarding filter retrieval, continued surveillance or declaration of device permanence, will be made at that time. Electronically Signed   By: [name] M.D.   On: 05/08/2017 14:31    Assessment/Plan: Left subdural hematoma, seizures: I think as the blood resolves his seizure will resolve.  He does not need surgery.  If his seizures continue we may need to repeat his CAT scan in a few days.  DVT: The patient has an IVC filter in place since we cannot anticoagulate him.  LOS: 2 days     Ophelia Charter 05/09/2017, 5:41 PM

## 2017-05-09 NOTE — Progress Notes (Signed)
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 Diagnoses of Fall, DVT (deep venous thrombosis) ([place]), Acute deep vein thrombosis (DVT) of distal vein of lower extremity, unspecified laterality ([place]), and Acute deep vein thrombosis (DVT) of proximal vein of lower extremity, unspecified laterality ([place]) were also pertinent to this visit.  Subdural hemorrhage-appreciate neurosurgery input-waxing and waning symptoms and R Upper ext weakness 1/21--loaded with Keppra and Neurology evaluated-now on Dilantin in addition-defer to NS if surgery is still an option--appreciate further input from Neurology-probably will need EEG Generalized weakness he had 2 falls one on Friday and then one on Saturday? 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  COPD no wheeze but can restart Combivent if needed and Atrovent Sacral fracture-probably nonoperative and would not do anything with regards to this except therapy and mobilization which we will monitor   Thigh high hose, full code,  inpatient, expect 1-2 more days hospital neurosurgery  Consultants:   Neurosurgery  Procedures:   None  Antimicrobials:   None  Subjective:  Awake alert no overt weakness no sob no cp No fever no chills Power in RUE and RLE back to nl Seems about usual baseline   Objective: Vitals:   05/09/17 0600 05/09/17 0727 05/09/17 0729 05/09/17 0800  BP: (!) 103/55  118/69 (!) 105/58  Pulse: 67  66 67  Resp: 16  19 18   Temp:   98.4 F (36.9 C)   TempSrc:  Oral Oral   SpO2: (!) 89%  92% 92%  Weight:      Height:        Intake/Output Summary (Last 24 hours) at 05/09/2017 0954 Last data filed at 05/09/2017 0700 Gross per 24 hour  Intake 2021 ml  Output 1150 ml  Net 871 ml   Filed Weights   05/07/17 0852 05/07/17 2139  Weight: 70.3 kg (155 lb) 67.2 kg (148 lb 2.4 oz)    Examination:  Awake alert and in nad Some somnolebce but awakens and oriented s1 s2 no m/r/g abd soft nt nd no rebound no guard Moves 4 limbs without deficit-power 5/5 eomi, no facial droop tongue midlinecoordination overall intact-however is weak  Data Reviewed: I have personally reviewed following labs and imaging studies  CBC: Recent Labs  Lab 05/07/17 0941 05/07/17 1000  WBC 13.0*  --   NEUTROABS 11.9*  --   HGB 11.4* 11.9*  HCT 36.5* 35.0*  MCV 86.9  --   PLT 76*  --    Basic Metabolic Panel: Recent Labs  Lab 05/07/17 0941 05/07/17 1000  NA 136 139  K 4.0 4.0  CL 107 104  CO2 21*  --  GLUCOSE 148* 144*  BUN 12 11  CREATININE 0.89 0.80  CALCIUM 8.8*  --    GFR: Estimated Creatinine Clearance: 78.2 mL/min (by C-G formula based on SCr of 0.8 mg/dL). Liver Function Tests: Recent Labs  Lab 05/07/17 0941  AST 28  ALT 37  ALKPHOS 70  BILITOT 0.9  PROT 6.7  ALBUMIN 4.1   No results for input(s): LIPASE, AMYLASE in the last 168 hours. No results for input(s): AMMONIA in the last 168 hours. Coagulation Profile: No results for input(s): INR, PROTIME in the last 168  hours. Cardiac Enzymes: Recent Labs  Lab 05/07/17 0941  CKTOTAL 36*   CBG: Recent Labs  Lab 05/07/17 0916  GLUCAP 131*   Urine analysis:    Component Value Date/Time   COLORURINE YELLOW 05/07/2017 1650   APPEARANCEUR CLEAR 05/07/2017 1650   LABSPEC 1.019 05/07/2017 1650   PHURINE 5.0 05/07/2017 1650   GLUCOSEU NEGATIVE 05/07/2017 1650   HGBUR NEGATIVE 05/07/2017 1650   BILIRUBINUR NEGATIVE 05/07/2017 1650   KETONESUR 20 (A) 05/07/2017 1650   PROTEINUR NEGATIVE 05/07/2017 1650   NITRITE NEGATIVE 05/07/2017 1650   LEUKOCYTESUR NEGATIVE 05/07/2017 1650     Radiology Studies: Reviewed images personally in health database    Scheduled Meds: . multivitamin with minerals  1 tablet Oral Daily  . phenytoin (DILANTIN) IV  100 mg Intravenous Q8H  . prazosin  1 mg Oral QHS  . rosuvastatin  10 mg Oral q1800  . ruxolitinib phosphate  5 mg Oral BID  . senna  1 tablet Oral BID  . sodium chloride flush  3 mL Intravenous Q12H   Continuous Infusions: . sodium chloride    . sodium chloride 75 mL/hr at 05/08/17 2122  . levETIRAcetam Stopped (05/09/17 0424)     LOS: 2 days    Time spent: [place], MD [facility]   If 7PM-7AM, please contact night-coverage [email or web address] Password [facility] 05/09/2017, 9:54 AM

## 2017-05-09 NOTE — Consult Note (Signed)
Physical Medicine and Rehabilitation Consult   Reason for Consult: TBI  Referring Physician: Dr. Verlon Au   HPI: Carlos Vaughn is a 72 y.o. male with history of COPD, PTSD,  RLE DVT 11/18 on  Lovenox, myelodysplastic syndrome with chronic thrombocytopenia and anemia who was admitted on 05/07/2017 with recurrent falls X one day, difficulty walking and weakness.  History taken from chart review and family.  CT head reviewed, showing left SDH.  Per report,medial left hemispheric SDH along the left falx and tentorium. Dr. Arnoldo Morale recommended montoring with serial CT and to stop lovenox for time being.  BLE dopplers done revealing acute DVT in right femoral vein and subacute DVT in right popliteal, posterior tib and peroneal veins therefore ICV filter placed by Dr. Anselm Pancoast on 1/20.  He was started on Keppra for seizure prophylaxis but developed RUE motor seizures lasting 20 minutes on 01/20. Repeat CT head stable and he was loaded with dilantin and Keppra increased to 1000 mg bid per neurology input.  EEG done today revealing mild diffuse slowing and no seizure activity. PT evaluation done today and CIR recommended due to functional deficits.    Review of Systems  Constitutional: Negative for chills and fever.  HENT: Negative for hearing loss and tinnitus.   Eyes: Negative for blurred vision and double vision.  Respiratory: Negative for cough and shortness of breath.   Cardiovascular: Negative for chest pain and palpitations.  Gastrointestinal: Negative for heartburn and nausea.  Genitourinary: Negative for dysuria and urgency.  Musculoskeletal: Positive for back pain (low back pain since fall) and myalgias.  Skin: Negative for rash.  Neurological: Negative for dizziness and headaches.  Psychiatric/Behavioral: Negative for hallucinations. The patient is not nervous/anxious.   All other systems reviewed and are negative.    Past Medical History:  Diagnosis Date  . COPD (chronic obstructive  pulmonary disease) ([place])   . Myelofibrosis ([place])   . PTSD (post-traumatic stress disorder)     Past Surgical History:  Procedure Laterality Date  . IR IVC FILTER PLMT / S&I /IMG GUID/MOD SED  05/08/2017    Family Hx: Denies any medical issues.     Social History: Married. Lives with wife and 85 year old adopted great grandson. Retired Hotel manager. He  reports that he has quit smoking. he has never used smokeless tobacco. He reports that he does not drink alcohol or use drugs.    Allergies: No Known Allergies    Medications Prior to Admission  Medication Sig Dispense Refill  . enoxaparin (LOVENOX) 80 MG/0.8ML injection Inject 0.7 mLs (70 mg total) every 12 (twelve) hours for 9 days into the skin. (Patient taking differently: Inject 1 mg/kg into the skin every 12 (twelve) hours. NOW PRESCRIBED EVERY 12 HOURS FOR AT LEAST 3 TO 6 MONTHS PER SPOUSE) 12.6 mL 0  . ibuprofen (ADVIL,MOTRIN) 200 MG tablet Take 400 mg by mouth 2 (two) times daily as needed for moderate pain.     . Ipratropium-Albuterol (COMBIVENT RESPIMAT) 20-100 MCG/ACT AERS respimat Inhale 1 puff into the lungs 4 (four) times daily.    . Multiple Vitamin (MULTIVITAMIN WITH MINERALS) TABS tablet Take 1 tablet by mouth daily.    . prazosin (MINIPRESS) 2 MG capsule Take 1 mg at bedtime by mouth.     . QUEtiapine (SEROQUEL) 100 MG tablet Take 100-200 tablets at bedtime by mouth.     . ruxolitinib phosphate (JAKAFI) 5 MG tablet Take 5 mg by mouth 2 (two) times daily.    [name]  simvastatin (ZOCOR) 80 MG tablet Take 40 mg by mouth at bedtime.    [name] albuterol (PROVENTIL HFA;VENTOLIN HFA) 108 (90 Base) MCG/ACT inhaler Inhale 1-2 puffs into the lungs every 6 (six) hours as needed for wheezing. (Patient not taking: Reported on 02/24/2017) 1 Inhaler 0  . doxycycline (VIBRAMYCIN) 100 MG capsule Take 1 capsule (100 mg total) by mouth 2 (two) times daily. (Patient not taking: Reported on 02/24/2017) 20 capsule 0  . HYDROcodone-homatropine (HYCODAN) 5-1.5  MG/5ML syrup Take 5 mLs by mouth every 6 (six) hours as needed for cough. (Patient not taking: Reported on 02/24/2017) 120 mL 0  . ipratropium (ATROVENT HFA) 17 MCG/ACT inhaler Inhale 2 puffs into the lungs 2 (two) times daily. (Patient not taking: Reported on 02/24/2017) 1 Inhaler 12    Home: Home Living Family/patient expects to be discharged to:: Private residence Living Arrangements: Spouse/significant other Available Help at Discharge: Family, Available 24 hours/day(lives with wife and adopted son) Type of Home: House Home Access: Stairs to enter Technical brewer of Steps: 4 Entrance Stairs-Rails: Right, Left Home Layout: Multi-level Alternate Level Stairs-Number of Steps: 2 Alternate Level Stairs-Rails: None Bathroom Shower/Tub: Multimedia programmer: Standard Home Equipment: None  Functional History: Prior Function Level of Independence: Independent Comments: Pt drove, did yardwork, rode his motorcycle. Was retired.  Functional Status:  Mobility: Bed Mobility Overal bed mobility: Needs Assistance Bed Mobility: Supine to Sit Supine to sit: Min assist General bed mobility comments: slight assist to come to EOB with assist to elevate trunk Transfers Overall transfer level: Needs assistance Equipment used: Rolling walker (2 wheeled), 2 person hand held assist Transfers: Sit to/from Stand, Stand Pivot Transfers Sit to Stand: Min assist Stand pivot transfers: Min assist General transfer comment: On way to 3N1, pt held onto PT and pivoted around to chair.  Pt fairly steady.  Used RW to transfer back to bed wtih incr steadiness with RW.       ADL:    Cognition: Cognition Overall Cognitive Status: Within Functional Limits for tasks assessed Orientation Level: Oriented X4 Cognition Arousal/Alertness: Awake/alert Behavior During Therapy: Anxious Overall Cognitive Status: Within Functional Limits for tasks assessed  Blood pressure (!) 108/57, pulse 70,  temperature 97.6 F (36.4 C), temperature source Oral, resp. rate 18, height 5\' 8"  (1.727 m), weight 67.2 kg (148 lb 2.4 oz), SpO2 95 %. Physical Exam  Nursing note and vitals reviewed. Constitutional: He is oriented to person, place, and time. He appears well-developed and well-nourished. He is sleeping. He is easily aroused. No distress.  Lying in bed --twisted amongst multiple wires and IV  HENT:  Head: Atraumatic.  Mouth/Throat: Oropharynx is clear and moist.  Eyes: EOM are normal. Pupils are equal, round, and reactive to light. Right eye exhibits no discharge. Left eye exhibits no discharge.  Neck: Normal range of motion. Neck supple.  Cardiovascular: Normal rate and regular rhythm.  Respiratory: Effort normal and breath sounds normal. No stridor. No respiratory distress. He has no wheezes.  GI: Soft. Bowel sounds are normal. He exhibits no distension. There is no tenderness.  Musculoskeletal: He exhibits no edema or tenderness.  Neurological: He is alert, oriented to person, place, and time and easily aroused.  Initially disoriented but cleared once fully awake.  Speech clear. Able to answer orientation questions without difficulty.  Able to follow simple motor commands.  Has decreased awareness of deficits.  Motor: Pyuria/RLE: 4/5 proximal to distal LUE/LLE: 4+/5 proximal to distal  Skin: Skin is warm and dry. He is  not diaphoretic.  Psychiatric: His mood appears anxious. He expresses impulsivity.    No results found for this or any previous visit (from the past 24 hour(s)). Dg Sacrum/coccyx  Result Date: 05/08/2017 CLINICAL DATA:  72 year old male status post fall yesterday with tail bone pain. EXAM: SACRUM AND COCCYX - 2+ VIEW COMPARISON:  No prior pelvis radiographs. FINDINGS: On the lateral view the lower lumbar and upper sacral segments appear intact, but there is a fracture deformity through the S5 sacral segment with mild anterior angulation and displacement. The coccygeal  segments appear to remain intact. SI joints appear intact. Remaining visible pelvis appears intact. IMPRESSION: Mildly angulated and displaced lower sacral fracture at S5. The upper sacrum and both SI joints appear intact. Electronically Signed   By: Genevie Ann M.D.   On: 05/08/2017 09:47   Ct Head Wo Contrast  Result Date: 05/08/2017 CLINICAL DATA:  Subdural hemorrhage follow-up EXAM: CT HEAD WITHOUT CONTRAST TECHNIQUE: Contiguous axial images were obtained from the base of the skull through the vertex without intravenous contrast. COMPARISON:  CT head 05/08/2017 FINDINGS: Brain: Left-sided interhemispheric subdural hematoma unchanged measuring up to 13 mm in thickness. Large left tentorial subdural hematoma also unchanged measuring up to 16 mm in thickness. No new area of hemorrhage. No subarachnoid hemorrhage. Generalized atrophy and chronic microvascular ischemia. No acute infarct. No midline shift. Negative for mass lesion. Vascular: Negative for hyperdense vessel Skull: Negative Sinuses/Orbits: Mild mucosal edema paranasal sinuses. Negative orbit Other: None IMPRESSION: Subdural hematoma along the falx and tentorium on the left unchanged from earlier today. No midline shift. No new hemorrhage. Electronically Signed   By: Franchot Gallo M.D.   On: 05/08/2017 18:35   Ct Head Wo Contrast  Result Date: 05/08/2017 CLINICAL DATA:  72 year old male with parasagittal subdural hematoma diagnosed on CT yesterday. Presented with progressive weakness. Personal history of myelodysplastic syndrome, was on Lovenox which was stopped. EXAM: CT HEAD WITHOUT CONTRAST TECHNIQUE: Contiguous axial images were obtained from the base of the skull through the vertex without intravenous contrast. COMPARISON:  Head CT 05/07/2017. FINDINGS: Brain: Left parafalcine and tentorial subdural blood redemonstrated. The hemorrhage remains predominantly hyperintense. Lobulated left parafalcine component up to 15 millimeters in thickness  above the body of the lateral ventricles is stable. Parafalcine blood thickness generally 7-8 millimeters or less elsewhere. The oval lobulated component along the left posterior tentorium is stable to 1 millimeter larger, up to 17 millimeters in thickness now. As before no peripheral subdural blood is identified. Mild mass effect on the left hemisphere and left lateral ventricle with no midline shift. No ventriculomegaly. Basilar cisterns remain normal. No new intracranial hemorrhage. Stable gray-white matter differentiation throughout the brain. No cortically based acute infarct identified. Vascular: Calcified atherosclerosis at the skull base. No suspicious intracranial vascular hyperdensity. Skull: Bone mineralization within normal limits. No skull fracture or No acute osseous abnormality identified. Sinuses/Orbits: Visualized paranasal sinuses and mastoids are stable and well pneumatized. Other: Visualized orbits and scalp soft tissues are within normal limits. IMPRESSION: 1. Stable to minimally increased volume of hyperdense left parafalcine and left tentorial subdural hematoma since 0933 hours yesterday. 2. Stable mild regional mass effect, with no midline shift or ventriculomegaly. 3. No new intracranial abnormality. Electronically Signed   By: Genevie Ann M.D.   On: 05/08/2017 07:14   Ir Ivc Filter Plmt / S&i /img Guid/mod Sed  Result Date: 05/08/2017 INDICATION: 73 year old with intracranial hemorrhage. Right lower extremity DVT. Patient is not an anticoagulation candidate at this time. Patient  needs IVC filter for pulmonary embolism prevention. EXAM: IVC FILTER PLACEMENT; IVC VENOGRAM; ULTRASOUND FOR VASCULAR ACCESS Physician: Stephan Minister. Anselm Pancoast, MD MEDICATIONS: None. ANESTHESIA/SEDATION: Fentanyl 25 mcg IV; Versed 1.0 mg IV Moderate Sedation Time:  14 minutes The patient was continuously monitored during the procedure by the interventional radiology nurse under my direct supervision. CONTRAST:  50 mL Isovue  FLUOROSCOPY TIME:  Fluoroscopy Time: 1 minutes 42 seconds (39 mGy). COMPLICATIONS: None immediate. PROCEDURE: Informed consent was obtained for an IVC venogram and filter placement. Ultrasound demonstrated a patent right common femoral vein. Ultrasound images were obtained for documentation. The right groin was prepped and draped in a sterile fashion. Maximal barrier sterile technique was utilized including caps, mask, sterile gowns, sterile gloves, sterile drape, hand hygiene and skin antiseptic. The skin was anesthetized with 1% lidocaine. A 21 gauge needle was directed into the vein with ultrasound guidance and a micropuncture dilator set was placed. A wire was advanced into the IVC. The filter sheath was advanced over the wire into the IVC. An IVC venogram was performed. Fluoroscopic images were obtained for documentation. A Bard Denali filter was deployed below the lowest renal vein. A follow-up venogram was performed and the vascular sheath was removed with manual compression. FINDINGS: IVC was patent. Bilateral renal veins were identified. Infrarenal inferior vena cava measured 24 mm. The filter was deployed below the lowest renal vein. Follow-up venogram confirmed placement within the IVC and below the renal veins. IMPRESSION: Successful placement of a retrievable IVC filter. PLAN: This IVC filter is potentially retrievable. The patient will be assessed for filter retrieval by Interventional Radiology in approximately 8-12 weeks. Further recommendations regarding filter retrieval, continued surveillance or declaration of device permanence, will be made at that time. Electronically Signed   By: [name] M.D.   On: 05/08/2017 14:31    Assessment/Plan: Diagnosis: TBI Labs and images independently reviewed.  Records reviewed and summated above.  [place] score:  ?V-VI  Speech to evaluate for Post traumatic amnesia and interval GOAT scores to assess progress.  NeuroPsych evaluation for  behavorial assessment.  Provide environmental management by reducing the level of stimulation, tolerating restlessness when possible, protecting patient from harming self or others and reducing patient's cognitive confusion.  Address behavioral concerns include providing structured environments and daily routines.  Cognitive therapy to direct modular abilities in order to maintain goals  including problem solving, self regulation/monitoring, self management, attention, and memory.  Fall precautions; pt at risk for second impact syndrome  Prevention of secondary injury: monitor for hypotension, hypoxia, seizures or signs of increased ICP  Prophylactic AED:   Consider pharmacological intervention if necessary with neurostimulants,  Such as amantadine, methylphenidate, modafinil, etc.  Consider Propranolol for agitation and storming  Avoid medications that could impair cognitive abilities, such as anticholinergics, antihistaminic, benzodiazapines, narcotics, etc when possible   1. Does the need for close, 24 hr/day medical supervision in concert with the patient's rehab needs make it unreasonable for this patient to be served in a less intensive setting? Yes  2. Co-Morbidities requiring supervision/potential complications: COPD (monitor respiratory rate and O2 sats with increased exertion), PTSD (monitor),  RLE DVT 11/18, myelodysplastic syndrome with chronic thrombocytopenia and anemia (continue to monitor, treat if necessary), acute DVT in right femoral vein and subacute DVT in right popliteal, posterior tib and peroneal veins (IVC filter in place), seizures (continue meds), leukocytosis (cont to monitor for signs and symptoms of infection, further workup if indicated), Thrombocytopenia (< 60,000/mm3 no resistive exercise) 3.  Due to safety, disease management, medication administration and patient education, does the patient require 24 hr/day rehab nursing? Yes 4. Does the patient require coordinated  care of a physician, rehab nurse, PT (1-2 hrs/day, 5 days/week), OT (1-2 hrs/day, 5 days/week) and SLP (1-2 hrs/day, 5 days/week) to address physical and functional deficits in the context of the above medical diagnosis(es)? Yes Addressing deficits in the following areas: balance, endurance, locomotion, strength, transferring, bathing, dressing, toileting, cognition and psychosocial support 5. Can the patient actively participate in an intensive therapy program of at least 3 hrs of therapy per day at least 5 days per week? Yes 6. The potential for patient to make measurable gains while on inpatient rehab is excellent 7. Anticipated functional outcomes upon discharge from inpatient rehab are modified independent and supervision  with PT, modified independent and supervision with OT, supervision with SLP. 8. Estimated rehab length of stay to reach the above functional goals is: 12-16 days. 9. Anticipated D/C setting: Home 10. Anticipated post D/C treatments: HH therapy and Home excercise program 11. Overall Rehab/Functional Prognosis: good  RECOMMENDATIONS: This patient's condition is appropriate for continued rehabilitative care in the following setting: CIR Patient has agreed to participate in recommended program. Potentially Note that insurance prior authorization may be required for reimbursement for recommended care.  Comment: Rehab Admissions Coordinator to follow up.  Delice Lesch, MD, ABPMR Bary Leriche, PA-C 05/09/2017

## 2017-05-09 NOTE — Progress Notes (Signed)
EEG completed; results pending.    

## 2017-05-09 NOTE — Progress Notes (Addendum)
Inpatient Rehabilitation  Per PT request, patient was screened by Gunnar Fusi for appropriateness for an Inpatient Acute Rehab consult.  At this time we are recommending an Inpatient Rehab consult as well as OT evaluate and treat orders in order to obtain insurance authorization.  Text paged MD to notify; please order if you are agreeable.    Carmelia Roller., CCC/SLP Admission Coordinator  [place]  Cell [phone]

## 2017-05-09 NOTE — Care Management Note (Addendum)
Case Management Note  Patient Details  Name: Carlos Vaughn MRN: [id number] Date of Birth: [birth year]  Subjective/Objective:    From home, presents with SDH, previous fall and recent DVT, he is now having seizure like activity.  Await pt eval.  PT eval rec CIR.  CIR consult ordered.         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  NCM left message with April with [facility] , awaiting return call. 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         Action/Plan: NCM will follow for dc needs.   Expected Discharge Date:  05/09/17               Expected Discharge Plan:     In-House Referral:     Discharge planning Services  CM Consult  Post Acute Care Choice:    Choice offered to:     DME Arranged:    DME Agency:     HH Arranged:    HH Agency:     Status of Service:  In process, will continue to follow  If discussed at Long Length of Stay Meetings, dates discussed:    Additional Comments:  Zenon Mayo, RN 05/09/2017, 11:09 AM

## 2017-05-09 NOTE — Evaluation (Signed)
Physical Therapy Evaluation Patient Details Name: Carlos Vaughn MRN: [id number] DOB: 1945/12/14 Today's Date: 05/09/2017   History of Present Illness  Pt admit after 2 falls with left SDH.  Also with right LE DVT with plan for possible IVC filter.  Pt also found to have sacral fx - nonoperative per MD.  Pt possibly with seizures on 05/08/17 with w/u in regards to this.  PMH:  COPD, PTSD  Clinical Impression  Pt admitted with above diagnosis. Pt currently with functional limitations due to the deficits listed below (see PT Problem List). Pt was able to transfer to 3N1 with assist and back to bed as he fatigued and EEG came to perform test.  Pt is a good rehab candidate once medically stable.  Will follow acutely.  Pt will benefit from skilled PT to increase their independence and safety with mobility to allow discharge to the venue listed below.      Follow Up Recommendations CIR;Supervision/Assistance - 24 hour    Equipment Recommendations  Other (comment)(TBA)    Recommendations for Other Services Rehab consult     Precautions / Restrictions Precautions Precautions: Fall Restrictions Weight Bearing Restrictions: No      Mobility  Bed Mobility Overal bed mobility: Needs Assistance Bed Mobility: Supine to Sit     Supine to sit: Min assist     General bed mobility comments: slight assist to come to EOB with assist to elevate trunk  Transfers Overall transfer level: Needs assistance Equipment used: Rolling walker (2 wheeled);2 person hand held assist Transfers: Sit to/from [facility] Sit to Stand: Min assist Stand pivot transfers: Min assist       General transfer comment: On way to 3N1, pt held onto PT and pivoted around to chair.  Pt fairly steady.  Used RW to transfer back to bed wtih incr steadiness with RW.   Ambulation/Gait                Stairs            Wheelchair Mobility    Modified Rankin (Stroke Patients Only) Modified  Rankin (Stroke Patients Only) Pre-Morbid Rankin Score: No symptoms Modified Rankin: Moderately severe disability     Balance Overall balance assessment: Needs assistance Sitting-balance support: No upper extremity supported;Feet supported Sitting balance-Leahy Scale: Fair     Standing balance support: Bilateral upper extremity supported;During functional activity Standing balance-Leahy Scale: Poor Standing balance comment: relies on UE support for balance                              Pertinent Vitals/Pain Pain Assessment: Faces Faces Pain Scale: Hurts even more Pain Location: buttocks Pain Descriptors / Indicators: Aching;Grimacing;Guarding;Sore Pain Intervention(s): Limited activity within patient's tolerance;Monitored during session;Repositioned  VSS  Home Living Family/patient expects to be discharged to:: Private residence Living Arrangements: Spouse/significant other Available Help at Discharge: Family;Available 24 hours/day(lives with wife and adopted son) Type of Home: House Home Access: Stairs to enter Entrance Stairs-Rails: Psychiatric nurse of Steps: 4 Home Layout: Multi-level Home Equipment: None      Prior Function Level of Independence: Independent         Comments: Pt drove, did yardwork, rode his motorcycle. Was retired.      Hand Dominance        Extremity/Trunk Assessment   Upper Extremity Assessment Upper Extremity Assessment: Defer to OT evaluation    Lower Extremity Assessment Lower Extremity Assessment: Generalized weakness  Cervical / Trunk Assessment Cervical / Trunk Assessment: Kyphotic  Communication   Communication: No difficulties  Cognition Arousal/Alertness: Awake/alert Behavior During Therapy: Anxious Overall Cognitive Status: Within Functional Limits for tasks assessed                                        General Comments      Exercises General Exercises - Lower  Extremity Ankle Circles/Pumps: AROM;Both;10 reps;Supine Long Arc Quad: AROM;Both;5 reps;Seated   Assessment/Plan    PT Assessment Patient needs continued PT services  PT Problem List Decreased activity tolerance;Decreased balance;Decreased mobility;Decreased knowledge of use of DME;Decreased safety awareness;Decreased knowledge of precautions;Cardiopulmonary status limiting activity       PT Treatment Interventions DME instruction;Gait training;Stair training;Functional mobility training;Therapeutic activities;Therapeutic exercise;Balance training;Patient/family education    PT Goals (Current goals can be found in the Care Plan section)  Acute Rehab PT Goals Patient Stated Goal: to gohome PT Goal Formulation: With patient Time For Goal Achievement: 05/23/17 Potential to Achieve Goals: Good    Frequency Min 4X/week   Barriers to discharge        Co-evaluation               AM-PAC PT "6 Clicks" Daily Activity  Outcome Measure Difficulty turning over in bed (including adjusting bedclothes, sheets and blankets)?: A Little Difficulty moving from lying on back to sitting on the side of the bed? : A Little Difficulty sitting down on and standing up from a chair with arms (e.g., wheelchair, bedside commode, etc,.)?: A Little Help needed moving to and from a bed to chair (including a wheelchair)?: A Little Help needed walking in hospital room?: Total Help needed climbing 3-5 steps with a railing? : Total 6 Click Score: 14    End of Session Equipment Utilized During Treatment: Gait belt Activity Tolerance: Patient limited by fatigue Patient left: in bed;with call bell/phone within reach;with bed alarm set;with family/visitor present Nurse Communication: Mobility status PT Visit Diagnosis: Muscle weakness (generalized) (M62.81);Unsteadiness on feet (R26.81)    Time: 7062-3762 PT Time Calculation (min) (ACUTE ONLY): 31 min   Charges:   PT Evaluation $PT Eval Moderate  Complexity: 1 Mod PT Treatments $Therapeutic Activity: 8-22 mins   PT G Codes:        [place] Annalaura Sauseda,PT Acute Rehabilitation [phone] (pager)   Denice Paradise 05/09/2017, 1:25 PM

## 2017-05-09 NOTE — Procedures (Signed)
HPI:  72 y/o with L SDH and jerking of RUE - r/o seizure  TECHNICAL SUMMARY:  A multichannel referential and bipolar montage EEG using the standard international 10-20 system was performed on the patient.  The dominant background activity consists of 7-8 hertz activity seen most prominantly over the posterior head region.  Slower 5 Hz activity can be seen occasionally intermixed in the frontal head regions.  Low voltage fast (beta) activity is distributed symmetrically and maximally over the anterior head regions.  ACTIVATION:  Stepwise photic stimulation and hyperventilation were not performed  EPILEPTIFORM ACTIVITY:  There were no spikes, sharp waves or paroxysmal activity.  SLEEP: No sleep was noted  CARDIAC:  The EKG lead revealed a regular rhythm.    IMPRESSION:  This is an abnormal EEG demonstrating a mild diffuse slowing of electrocerebral activity.  This can be seen in a wide variety of encephalopathic state including those of a toxic, metabolic, or degenerative nature.  There were no focal, hemispheric, or lateralizing features.  No epileptiform activity was recorded.  This does not exclude the diagnosis of a seizure disorder and clinical correlation is required.

## 2017-05-10 LAB — BASIC METABOLIC PANEL
Anion gap: 9 (ref 5–15)
BUN: 8 mg/dL (ref 6–20)
CALCIUM: 8.4 mg/dL — AB (ref 8.9–10.3)
CO2: 22 mmol/L (ref 22–32)
CREATININE: 0.87 mg/dL (ref 0.61–1.24)
Chloride: 106 mmol/L (ref 101–111)
GFR calc non Af Amer: >60 mL/min (ref >60)
GLUCOSE: 109 mg/dL — AB (ref 65–99)
Potassium: 3.5 mmol/L (ref 3.5–5.1)
Sodium: 137 mmol/L (ref 135–145)

## 2017-05-10 LAB — CBC
HEMATOCRIT: 32.9 % — AB (ref 39.0–52.0)
Hemoglobin: 10.3 g/dL — ABNORMAL LOW (ref 13.0–17.0)
MCH: 27.2 pg (ref 26.0–34.0)
MCHC: 31.3 g/dL (ref 30.0–36.0)
MCV: 87 fL (ref 78.0–100.0)
PLATELETS: 84 10*3/uL — AB (ref 150–400)
RBC: 3.78 MIL/uL — ABNORMAL LOW (ref 4.22–5.81)
RDW: 21.8 % — AB (ref 11.5–15.5)
WBC: 14.7 10*3/uL — ABNORMAL HIGH (ref 4.0–10.5)

## 2017-05-10 MED ORDER — QUETIAPINE FUMARATE 100 MG PO TABS: 100.0000 mg | ORAL_TABLET | Freq: Every day | ORAL | Status: DC

## 2017-05-10 MED ORDER — TRAZODONE HCL 50 MG PO TABS: 50.0000 mg | ORAL_TABLET | Freq: Every evening | ORAL | 0 refills | Status: DC | PRN

## 2017-05-10 MED ORDER — SODIUM CHLORIDE 0.9 % IV BOLUS (SEPSIS)
500.0000 mL | Freq: Once | INTRAVENOUS | Status: AC
  Administered 2017-05-10: 500 mL via INTRAVENOUS

## 2017-05-10 NOTE — Progress Notes (Signed)
Physical Therapy Treatment Patient Details Name: Carlos Vaughn MRN: [id number] DOB: 1946-01-30 Today's Date: 05/10/2017    History of Present Illness Pt admit after 2 falls with left SDH.  Also with right LE DVT with plan for possible IVC filter.  Pt also found to have sacral fx - nonoperative per MD.  Pt possibly with seizures on 05/08/17 with w/u in regards to this.  PMH:  COPD, PTSD    PT Comments    Pt admitted with above diagnosis. Pt currently with functional limitations due to balance and endurance deficits. Pt was able to ambulate on unit with RW with cues for safety and steadying asssit at times.  Pt impulsive and needs cues to slow down.  Great family support and they are anxious to get pt to REhab and then home.  Pt will benefit from skilled PT to increase their independence and safety with mobility to allow discharge to the venue listed below.     Follow Up Recommendations  CIR;Supervision/Assistance - 24 hour     Equipment Recommendations  Other (comment)(TBA)    Recommendations for Other Services Rehab consult     Precautions / Restrictions Precautions Precautions: Fall Precaution Comments: seizures Restrictions Weight Bearing Restrictions: No    Mobility  Bed Mobility Overal bed mobility: Needs Assistance Bed Mobility: Supine to Sit     Supine to sit: Min guard     General bed mobility comments: pt exiting bed on left side and able to complete with 2 rest breaks. Pt states "let me get my bearings"  Transfers Overall transfer level: Needs assistance Equipment used: Rolling walker (2 wheeled) Transfers: Sit to/from Stand Sit to Stand: Min assist Stand pivot transfers: Min assist       General transfer comment: pt showing some awareness to unsteady movement. Pt could not put socks on.  Ambulation/Gait Ambulation/Gait assistance: Min assist;Min guard;+2 safety/equipment Ambulation Distance (Feet): 300 Feet Assistive device: Rolling walker (2  wheeled) Gait Pattern/deviations: Step-through pattern;Decreased stride length;Drifts right/left;Trunk flexed   Gait velocity interpretation: Below normal speed for age/gender General Gait Details: Pt generally unsteady at times needing steadying assist.  Pt progressed distance today with ambulation but still with safety issues.  Pt unaware of surroundings and needs repetition to complete tasks.  Pt did get onto 3N1 at end of session.  Could not wipe himself and urinated out of commode.     Stairs            Wheelchair Mobility    Modified Rankin (Stroke Patients Only) Modified Rankin (Stroke Patients Only) Pre-Morbid Rankin Score: No symptoms Modified Rankin: Moderately severe disability     Balance Overall balance assessment: Needs assistance Sitting-balance support: Bilateral upper extremity supported;Feet supported Sitting balance-Leahy Scale: Fair     Standing balance support: Bilateral upper extremity supported;During functional activity Standing balance-Leahy Scale: Poor Standing balance comment: relies on UE support for balance                             Cognition Arousal/Alertness: Awake/alert Behavior During Therapy: Anxious Overall Cognitive Status: Impaired/Different from baseline Area of Impairment: Attention;Awareness                   Current Attention Level: Selective       Awareness: Emergent   General Comments: pt repeating questions and states "oh yeah ,,, come on wake up Carlos Vaughn" pt needed redirection to sequence task      Exercises General  Exercises - Lower Extremity Ankle Circles/Pumps: AROM;Both;10 reps;Supine Long Arc Quad: AROM;Both;5 reps;Seated    General Comments        Pertinent Vitals/Pain Pain Assessment: Faces Faces Pain Scale: Hurts little more Pain Location: sacrum Pain Descriptors / Indicators: Aching Pain Intervention(s): Limited activity within patient's tolerance;Monitored during session;Premedicated  before session;Repositioned    Home Living Family/patient expects to be discharged to:: Private residence Living Arrangements: Spouse/significant other Available Help at Discharge: Family;Available 24 hours/day Type of Home: House Home Access: Stairs to enter Entrance Stairs-Rails: Right;Left Home Layout: Multi-level Home Equipment: None Additional Comments: has a small dog name Carlos Vaughn and 42 yo adopted child living with him    Prior Function Level of Independence: Independent      Comments: Pt drove, did yardwork, rode his motorcycle. Was retired.    PT Goals (current goals can now be found in the care plan section) Acute Rehab PT Goals Patient Stated Goal: to gohome Progress towards PT goals: Progressing toward goals    Frequency    Min 4X/week      PT Plan Current plan remains appropriate    Co-evaluation              AM-PAC PT "6 Clicks" Daily Activity  Outcome Measure  Difficulty turning over in bed (including adjusting bedclothes, sheets and blankets)?: A Little Difficulty moving from lying on back to sitting on the side of the bed? : A Little Difficulty sitting down on and standing up from a chair with arms (e.g., wheelchair, bedside commode, etc,.)?: A Little Help needed moving to and from a bed to chair (including a wheelchair)?: A Little Help needed walking in hospital room?: Total Help needed climbing 3-5 steps with a railing? : Total 6 Click Score: 14    End of Session Equipment Utilized During Treatment: Gait belt Activity Tolerance: Patient limited by fatigue Patient left: with call bell/phone within reach;with family/visitor present;in chair Nurse Communication: Mobility status PT Visit Diagnosis: Muscle weakness (generalized) (M62.81);Unsteadiness on feet (R26.81)     Time: 5366-4403 PT Time Calculation (min) (ACUTE ONLY): 20 min  Charges:  $Gait Training: 8-22 mins                    G Codes:       Green [place],PT Acute  Rehabilitation [phone] (pager)    Denice Paradise 05/10/2017, 11:43 AM

## 2017-05-10 NOTE — Progress Notes (Signed)
Patient became hypotensive BP 83/32 Schorr, NP notified  500 ml bolus given BP increased to 97/46 Will continue to monitor

## 2017-05-10 NOTE — Progress Notes (Signed)
Patient being screened for CIR--can d/c in am--see d/c summary dated 1/22

## 2017-05-10 NOTE — Plan of Care (Signed)
Patient has remained restless through the night says that he continued to have discomfort in his sacral area & declined any pain medications on this shift.  Oral intake has also been very poor, patient very drowsy all night Urinary output has also been low this shift.

## 2017-05-10 NOTE — Progress Notes (Signed)
I met with pt and his daughter at bedside to discuss goals and expectations of an inpt rehab admit. I explained that I would seek  approval from [facility] not the [place] system. They agreed. I will begin authorizations and follow up. Admission pending insurance approval and bed availability. [phone]

## 2017-05-10 NOTE — Progress Notes (Signed)
Patient transfered from 59M to 3W11 at this time. Family at bedside. Safety precautions and orders reviewed. TELE applied and confirm. VSS. No distress noted. Will continue to monitor.  Ave Filter, RN

## 2017-05-10 NOTE — Evaluation (Signed)
Occupational Therapy Evaluation Patient Details Name: Carlos Vaughn MRN: [id number] DOB: Aug 09, 1945 Today's Date: 05/10/2017    History of Present Illness Pt admit after 2 falls with left SDH.  Also with right LE DVT with plan for possible IVC filter.  Pt also found to have sacral fx - nonoperative per MD.  Pt possibly with seizures on 05/08/17 with w/u in regards to this.  PMH:  COPD, PTSD   Clinical Impression   PT admitted with L SDH and seizures. Pt currently with functional limitiations due to the deficits listed below (see OT problem list). PTA was independent but now requires min (A) for basic transfers and setup for breakfast. Pt demonstrates unsteady posture and tremor to UEs.  Pt will benefit from skilled OT to increase their independence and safety with adls and balance to allow discharge CIR to maximize independence for home. Pt is caregiver for 63 yo and a dog name Boomer.  Prefers to be called "Carlos Vaughn"     Follow Up Recommendations  CIR    Equipment Recommendations  3 in 1 bedside commode    Recommendations for Other Services Rehab consult     Precautions / Restrictions Precautions Precautions: Fall Precaution Comments: seizures Restrictions Weight Bearing Restrictions: No      Mobility Bed Mobility Overal bed mobility: Needs Assistance Bed Mobility: Supine to Sit     Supine to sit: Min guard     General bed mobility comments: pt exiting bed on left side and able to complete with 2 rest breaks. Pt states "let me get my bearings"  Transfers Overall transfer level: Needs assistance Equipment used: 1 person hand held assist Transfers: Sit to/from Stand Sit to Stand: Min assist Stand pivot transfers: Min assist       General transfer comment: pt unsteady and reports "i am not quit back to normal am I?" pt showing some awarness to unsteady movement.     Balance Overall balance assessment: Needs assistance Sitting-balance support: Bilateral upper  extremity supported;Feet supported Sitting balance-Leahy Scale: Fair     Standing balance support: Bilateral upper extremity supported;During functional activity Standing balance-Leahy Scale: Poor                             ADL either performed or assessed with clinical judgement   ADL Overall ADL's : Needs assistance/impaired Eating/Feeding: Set up;Sitting Eating/Feeding Details (indicate cue type and reason): requires (A) to open things and was unable to stick straw into grape juice container.  Grooming: Set up Grooming Details (indicate cue type and reason): washing hands with purell wipe             Lower Body Dressing: Moderate assistance Lower Body Dressing Details (indicate cue type and reason): pt requires steady (A) for sit <>Stand Toilet Transfer: Minimal assistance Toilet Transfer Details (indicate cue type and reason): stand pivot to chair and reaching for environmental supports         Functional mobility during ADLs: (pending PT with +2 (A) - see PT note) General ADL Comments: pt reports HA and not sleeping well. pt eager to d/c and very fixated on the overall cost of hospitalization. pt encouraged to focus on healing and getting back to a level of independence to care for 74 yo at home. Pt states " you are probably right but lord that bill"     Vision   Additional Comments: pt reports dizziness but no nystagmus noted at this  time     Perception     Praxis      Pertinent Vitals/Pain Pain Assessment: Faces Faces Pain Scale: Hurts even more Pain Location: headache Pain Descriptors / Indicators: Aching Pain Intervention(s): Monitored during session;Premedicated before session;Repositioned;RN gave pain meds during session     Hand Dominance Right   Extremity/Trunk Assessment Upper Extremity Assessment Upper Extremity Assessment: Generalized weakness(tremor noted)   Lower Extremity Assessment Lower Extremity Assessment: Defer to PT  evaluation   Cervical / Trunk Assessment Cervical / Trunk Assessment: Kyphotic   Communication Communication Communication: HOH   Cognition Arousal/Alertness: Awake/alert Behavior During Therapy: Anxious Overall Cognitive Status: Impaired/Different from baseline Area of Impairment: Attention;Awareness                   Current Attention Level: Selective       Awareness: Emergent   General Comments: pt repeating questions and states "oh yeah ,,, come on wake up Carlos Vaughn" pt needed redirection to sequence task   General Comments       Exercises     Shoulder Instructions      Home Living Family/patient expects to be discharged to:: Private residence Living Arrangements: Spouse/significant other Available Help at Discharge: Family;Available 24 hours/day Type of Home: House Home Access: Stairs to enter [employer] of Steps: 4 Entrance Stairs-Rails: Right;Left Home Layout: Multi-level Alternate Level Stairs-Number of Steps: 2 Alternate Level Stairs-Rails: None Bathroom Shower/Tub: Occupational psychologist: Standard     Home Equipment: None   Additional Comments: has a Engineer, materials name Boomer and 34 yo adopted child living with him      Prior Functioning/Environment Level of Independence: Independent        Comments: Pt drove, did yardwork, rode his motorcycle. Was retired.         OT Problem List: Decreased strength;Decreased activity tolerance;Impaired balance (sitting and/or standing);Decreased coordination;Decreased cognition;Decreased safety awareness;Decreased knowledge of precautions;Decreased knowledge of use of DME or AE;Pain      OT Treatment/Interventions: Self-care/ADL training;Therapeutic exercise;Neuromuscular education;DME and/or AE instruction;Therapeutic activities;Cognitive remediation/compensation;Patient/family education;Balance training    OT Goals(Current goals can be found in the care plan section) Acute Rehab OT  Goals Patient Stated Goal: to gohome OT Goal Formulation: With patient Time For Goal Achievement: 05/24/17 Potential to Achieve Goals: Good  OT Frequency: Min 3X/week   Barriers to D/C:            Co-evaluation              AM-PAC PT "6 Clicks" Daily Activity     Outcome Measure Help from another person eating meals?: A Little Help from another person taking care of personal grooming?: A Little Help from another person toileting, which includes using toliet, bedpan, or urinal?: A Lot Help from another person bathing (including washing, rinsing, drying)?: A Lot Help from another person to put on and taking off regular upper body clothing?: A Little Help from another person to put on and taking off regular lower body clothing?: A Lot 6 Click Score: 15   End of Session Nurse Communication: Mobility status;Precautions  Activity Tolerance: Patient tolerated treatment well Patient left: in chair;with call bell/phone within reach;with nursing/sitter in room  OT Visit Diagnosis: Unsteadiness on feet (R26.81);Ataxia, unspecified (R27.0)                Time: 6834-1962 OT Time Calculation (min): 20 min Charges:  OT General Charges $OT Visit: 1 Visit OT Evaluation $OT Eval Moderate Complexity: 1 Mod G-Codes:  Jeri Modena   OTR/L Pager: [phone] Office: [phone] .   Parke Poisson B 05/10/2017, 9:22 AM

## 2017-05-10 NOTE — Discharge Summary (Signed)
Physician Discharge Summary  Carlos Vaughn DVV:[id number] DOB: [birth year] DOA: 05/07/2017  PCP: Caralyn Guile, DO  Admit date: 05/07/2017 Discharge date: 05/10/2017  Time spent: 25 minutes  Recommendations for Outpatient Follow-up:  1. Patient will need consideration for removal of IVC filter 6 months to 1 year and consideration for anticoagulation subsequently 2. Recommend outpatient his oncologist for myelodysplasia and continuation of chemotherapy 3. Needs CBC and differential in 1 week as well as Chem-12 4. 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   Acute deep vein thrombosis (DVT) of distal vein of lower extremity (HCC)   Acute deep vein thrombosis (DVT) of proximal vein of lower extremity (HCC)   Subdural hematoma (HCC)   Chronic obstructive pulmonary disease (HCC)   PTSD (post-traumatic stress disorder)   Seizures (HCC)   Leukocytosis   Thrombocytopenia (HCC)   Anemia of chronic disease   Discharge Condition: Improved  Diet recommendation: Heart healthy  Filed Weights   05/07/17 0852 05/07/17 2139  Weight: 70.3 kg (155 lb) 67.2 kg (148 lb 2.4 oz)    History of present illness:  65 fem extensive RLE DVT 02/2017, Myelodysplastic synd + Chr TCP + anemia,  Hydrocele surgery 2011 Headaches + worsening over 24 hrs and found to have L subdural hemorrhage + mild mass effect and NS consulted who recommended transfer to [facility] for closer monitoring and consideration of potential surgery   Hospital Course:  Subdural hemorrhage-appreciate neurosurgery input-waxing and waning symptoms and R Upper ext weakness 1/21--loaded with Keppra on admission and will be placed on 1 g twice daily on discharge no  role for surgery from neurosurgery perspective Generalized weakness he had 2 falls one on Friday and then one on Saturday?  His weakness is improved he was ambulating without any specific issue on discharge Acute right lower extremity DVT-DVT is noted in the right lower extremity however patient was compliant on Lovenox-likely secondary to prothrombotic state of mild dysplasia-IVC filter placed 1/20--will need removal when safe for [facility] again Myelodysplasia C plus chronic thrombocytopenia/anemia-monitor counts and continue Jakafi as per [place] hematologist Bipolar continue Seroquel 100 at bedtime from tomorrow ?  COPD no wheeze but can restart Combivent if needed and Atrovent Sacral fracture-probably nonoperative and would not do anything with regards to this except therapy and mobilization--will go to CIR for the same     Procedures:  CT scans of head   Consultations:  Neurology, neurosurgery  Discharge Exam: Vitals:   05/10/17 1400 05/10/17 1500  BP: (!) 110/52 (!) 101/51  Pulse: 71 64  Resp: (!) 24 (!) 22  Temp:    SpO2: 99% 96%    General: Alert oriented no distress however somewhat weaker and mild headache Cardiovascular: S1-S2 no murmur rub or gallop Respiratory: Chest clinically clear no added sound Neuro is intact without defict is  Discharge Instructions   Discharge Instructions    Diet - low sodium heart healthy   Complete by:  As directed    Increase activity slowly   Complete by:  As directed      Allergies as of 05/10/2017   No Known Allergies     Medication List    STOP taking these medications   doxycycline 100 MG capsule Commonly known  as:  VIBRAMYCIN   enoxaparin 80 MG/0.8ML injection Commonly known as:  LOVENOX   ibuprofen 200 MG tablet Commonly known as:  ADVIL,MOTRIN     TAKE these medications   albuterol 108 (90 Base) MCG/ACT inhaler Commonly known as:  PROVENTIL HFA;VENTOLIN HFA Inhale 1-2 puffs into the lungs every 6 (six) hours as needed  for wheezing.   COMBIVENT RESPIMAT 20-100 MCG/ACT Aers respimat Generic drug:  Ipratropium-Albuterol Inhale 1 puff into the lungs 4 (four) times daily.   HYDROcodone-homatropine 5-1.5 MG/5ML syrup Commonly known as:  HYCODAN Take 5 mLs by mouth every 6 (six) hours as needed for cough.   ipratropium 17 MCG/ACT inhaler Commonly known as:  ATROVENT HFA Inhale 2 puffs into the lungs 2 (two) times daily.   multivitamin with minerals Tabs tablet Take 1 tablet by mouth daily.   prazosin 2 MG capsule Commonly known as:  MINIPRESS Take 1 mg at bedtime by mouth.   QUEtiapine 100 MG tablet Commonly known as:  SEROQUEL Take 1 tablet (100 mg total) by mouth at bedtime. What changed:  how much to take   ruxolitinib phosphate 5 MG tablet Commonly known as:  JAKAFI Take 5 mg by mouth 2 (two) times daily.   simvastatin 80 MG tablet Commonly known as:  ZOCOR Take 40 mg by mouth at bedtime.   traZODone 50 MG tablet Commonly known as:  DESYREL Take 1 tablet (50 mg total) by mouth at bedtime as needed for sleep.      No Known Allergies    The results of significant diagnostics from this hospitalization (including imaging, microbiology, ancillary and laboratory) are listed below for reference.    Significant Diagnostic Studies: Dg Sacrum/coccyx  Result Date: 05/08/2017 CLINICAL DATA:  72 year old male status post fall yesterday with tail bone pain. EXAM: SACRUM AND COCCYX - 2+ VIEW COMPARISON:  No prior pelvis radiographs. FINDINGS: On the lateral view the lower lumbar and upper sacral segments appear intact, but there is a fracture deformity through the S5 sacral segment with mild anterior angulation and displacement. The coccygeal segments appear to remain intact. SI joints appear intact. Remaining visible pelvis appears intact. IMPRESSION: Mildly angulated and displaced lower sacral fracture at S5. The upper sacrum and both SI joints appear intact. Electronically Signed   By: Genevie Ann  M.D.   On: 05/08/2017 09:47   Ct Head Wo Contrast  Result Date: 05/08/2017 CLINICAL DATA:  Subdural hemorrhage follow-up EXAM: CT HEAD WITHOUT CONTRAST TECHNIQUE: Contiguous axial images were obtained from the base of the skull through the vertex without intravenous contrast. COMPARISON:  CT head 05/08/2017 FINDINGS: Brain: Left-sided interhemispheric subdural hematoma unchanged measuring up to 13 mm in thickness. Large left tentorial subdural hematoma also unchanged measuring up to 16 mm in thickness. No new area of hemorrhage. No subarachnoid hemorrhage. Generalized atrophy and chronic microvascular ischemia. No acute infarct. No midline shift. Negative for mass lesion. Vascular: Negative for hyperdense vessel Skull: Negative Sinuses/Orbits: Mild mucosal edema paranasal sinuses. Negative orbit Other: None IMPRESSION: Subdural hematoma along the falx and tentorium on the left unchanged from earlier today. No midline shift. No new hemorrhage. Electronically Signed   By: Franchot Gallo M.D.   On: 05/08/2017 18:35   Ct Head Wo Contrast  Result Date: 05/08/2017 CLINICAL DATA:  73 year old male with parasagittal subdural hematoma diagnosed on CT yesterday. Presented with progressive weakness. Personal history of myelodysplastic syndrome, was on Lovenox which was stopped. EXAM: CT HEAD WITHOUT CONTRAST TECHNIQUE: Contiguous axial images were obtained from  the base of the skull through the vertex without intravenous contrast. COMPARISON:  Head CT 05/07/2017. FINDINGS: Brain: Left parafalcine and tentorial subdural blood redemonstrated. The hemorrhage remains predominantly hyperintense. Lobulated left parafalcine component up to 15 millimeters in thickness above the body of the lateral ventricles is stable. Parafalcine blood thickness generally 7-8 millimeters or less elsewhere. The oval lobulated component along the left posterior tentorium is stable to 1 millimeter larger, up to 17 millimeters in thickness now.  As before no peripheral subdural blood is identified. Mild mass effect on the left hemisphere and left lateral ventricle with no midline shift. No ventriculomegaly. Basilar cisterns remain normal. No new intracranial hemorrhage. Stable gray-white matter differentiation throughout the brain. No cortically based acute infarct identified. Vascular: Calcified atherosclerosis at the skull base. No suspicious intracranial vascular hyperdensity. Skull: Bone mineralization within normal limits. No skull fracture or No acute osseous abnormality identified. Sinuses/Orbits: Visualized paranasal sinuses and mastoids are stable and well pneumatized. Other: Visualized orbits and scalp soft tissues are within normal limits. IMPRESSION: 1. Stable to minimally increased volume of hyperdense left parafalcine and left tentorial subdural hematoma since 0933 hours yesterday. 2. Stable mild regional mass effect, with no midline shift or ventriculomegaly. 3. No new intracranial abnormality. Electronically Signed   By: Genevie Ann M.D.   On: 05/08/2017 07:14   Ct Head Wo Contrast  Addendum Date: 05/07/2017   ADDENDUM REPORT: 05/07/2017 10:40 ADDENDUM: Critical Value/emergent results were called by telephone at the time of interpretation on 05/07/2017 At 1011 hours to Dr. Quintella Reichert , who verbally acknowledged these results. Electronically Signed   By: Genevie Ann M.D.   On: 05/07/2017 10:40   Result Date: 05/07/2017 CLINICAL DATA:  72 year old male with weakness beginning 1 week ago, progressive symptoms for 2 days. EXAM: CT HEAD WITHOUT CONTRAST TECHNIQUE: Contiguous axial images were obtained from the base of the skull through the vertex without intravenous contrast. COMPARISON:  None. FINDINGS: Brain: Lobulated and mixed density but mostly hyperdense subdural hemorrhage left paracentral along the falx and left tentorium. A thin strip of hemorrhage mostly 4-5 millimeters in areas, but with focal lobulated components up to the 14 or 16  millimeters in thickness (series 2, image 22 and coronal image 52). No superimposed peripheral extra-axial hemorrhage is identified. No definite posterior fossa blood. No subarachnoid, parenchymal, or intraventricular hemorrhage. Mild mass effect on the left hemisphere including mild effacement of the left lateral ventricle occipital horn and atrium. No midline shift or ventriculomegaly. Patent basilar cisterns. No cortically based acute infarct identified. Gray-white matter differentiation appears within normal limits for age throughout the brain. Vascular: Calcified atherosclerosis at the skull base. Skull: No skull fracture identified. Visible osseous structures appear normal. Sinuses/Orbits: Clear. Other: Visualized orbits and scalp soft tissues are within normal limits. IMPRESSION: 1. Smooth and lobulated acute appearing Subdural Hemorrhage along the medial left hemisphere tracking along the left falx and left tentorium. Hemorrhage lobulation up to 16 mm in thickness. 2. Etiology of #1 uncertain, with no superimposed skull fracture or other acute intracranial abnormality identified. Query coagulopathy. 3. Mild intracranial mass effect at this time. No midline shift and patent basilar cisterns. Electronically Signed: By: Genevie Ann M.D. On: 05/07/2017 10:11   Ir Ivc Filter Plmt / S&i /img Guid/mod Sed  Result Date: 05/08/2017 INDICATION: 72 year old with intracranial hemorrhage. Right lower extremity DVT. Patient is not an anticoagulation candidate at this time. Patient needs IVC filter for pulmonary embolism prevention. EXAM: IVC FILTER PLACEMENT; IVC VENOGRAM; ULTRASOUND FOR VASCULAR  ACCESS Physician: Stephan Minister. Anselm Pancoast, MD MEDICATIONS: None. ANESTHESIA/SEDATION: Fentanyl 25 mcg IV; Versed 1.0 mg IV Moderate Sedation Time:  14 minutes The patient was continuously monitored during the procedure by the interventional radiology nurse under my direct supervision. CONTRAST:  50 mL Isovue FLUOROSCOPY TIME:  Fluoroscopy  Time: 1 minutes 42 seconds (39 mGy). COMPLICATIONS: None immediate. PROCEDURE: Informed consent was obtained for an IVC venogram and filter placement. Ultrasound demonstrated a patent right common femoral vein. Ultrasound images were obtained for documentation. The right groin was prepped and draped in a sterile fashion. Maximal barrier sterile technique was utilized including caps, mask, sterile gowns, sterile gloves, sterile drape, hand hygiene and skin antiseptic. The skin was anesthetized with 1% lidocaine. A 21 gauge needle was directed into the vein with ultrasound guidance and a micropuncture dilator set was placed. A wire was advanced into the IVC. The filter sheath was advanced over the wire into the IVC. An IVC venogram was performed. Fluoroscopic images were obtained for documentation. A Bard Denali filter was deployed below the lowest renal vein. A follow-up venogram was performed and the vascular sheath was removed with manual compression. FINDINGS: IVC was patent. Bilateral renal veins were identified. Infrarenal inferior vena cava measured 24 mm. The filter was deployed below the lowest renal vein. Follow-up venogram confirmed placement within the IVC and below the renal veins. IMPRESSION: Successful placement of a retrievable IVC filter. PLAN: This IVC filter is potentially retrievable. The patient will be assessed for filter retrieval by Interventional Radiology in approximately 8-12 weeks. Further recommendations regarding filter retrieval, continued surveillance or declaration of device permanence, will be made at that time. Electronically Signed   By: [name] M.D.   On: 05/08/2017 14:31   Dg Chest Port 1 View  Result Date: 05/07/2017 CLINICAL DATA:  Pain following fall EXAM: PORTABLE CHEST 1 VIEW COMPARISON:  October 10, 2016 and Aug 21, 2016 FINDINGS: Previous airspace consolidation the right has cleared. There is mild scarring in the right mid lung. Lungs elsewhere clear. Heart is upper  normal in size with pulmonary vascularity normal. There is aortic atherosclerosis. No adenopathy. No bone lesions. No evident pneumothorax. IMPRESSION: Mild scarring right mid lung. No edema or consolidation. There is aortic atherosclerosis. Aortic Atherosclerosis (ICD10-I70.0). Electronically Signed   By: Lowella Grip III M.D.   On: 05/07/2017 10:11    Microbiology: Recent Results (from the past 240 hour(s))  MRSA PCR Screening     Status: None   Collection Time: 05/07/17  9:32 PM  Result Value Ref Range Status   MRSA by PCR NEGATIVE NEGATIVE Final    Comment:        The GeneXpert MRSA Assay (FDA approved for NASAL specimens only), is one component of a comprehensive MRSA colonization surveillance program. It is not intended to diagnose MRSA infection nor to guide or monitor treatment for MRSA infections.      Labs: Basic Metabolic Panel: Recent Labs  Lab 05/07/17 0941 05/07/17 1000 05/10/17 0634  NA 136 139 137  K 4.0 4.0 3.5  CL 107 104 106  CO2 21*  --  22  GLUCOSE 148* 144* 109*  BUN 12 11 8   CREATININE 0.89 0.80 0.87  CALCIUM 8.8*  --  8.4*   Liver Function Tests: Recent Labs  Lab 05/07/17 0941  AST 28  ALT 37  ALKPHOS 70  BILITOT 0.9  PROT 6.7  ALBUMIN 4.1   No results for input(s): LIPASE, AMYLASE in the last 168 hours. No results  for input(s): AMMONIA in the last 168 hours. CBC: Recent Labs  Lab 05/07/17 0941 05/07/17 1000 05/10/17 0634  WBC 13.0*  --  14.7*  NEUTROABS 11.9*  --   --   HGB 11.4* 11.9* 10.3*  HCT 36.5* 35.0* 32.9*  MCV 86.9  --  87.0  PLT 76*  --  84*   Cardiac Enzymes: Recent Labs  Lab 05/07/17 0941  CKTOTAL 36*   BNP: BNP (last 3 results) No results for input(s): BNP in the last 8760 hours.  ProBNP (last 3 results) No results for input(s): PROBNP in the last 8760 hours.  CBG: Recent Labs  Lab 05/07/17 0916  GLUCAP 131*       Signed:  Nita Sells MD   [facility] 05/10/2017, 4:17  PM

## 2017-05-10 NOTE — Progress Notes (Signed)
Patient has been restless and uncomfortable most of the day.  Gave PO pain meds (oxy 5mg ) twice today with little relief. Repositioned patient using pillows which helps some also.  Will continue to monitor patients pain and suggest alternative measures for pain relief.

## 2017-05-10 NOTE — Progress Notes (Signed)
Neurology Progress Note   S:// Patient seen and examined.  No new seizures reported overnight.   O:// Current vital signs: BP 98/65 (BP Location: Right Arm)   Pulse 66   Temp 98 F (36.7 C) (Oral)   Resp 20   Ht 5\' 8"  (1.727 m)   Wt 67.2 kg (148 lb 2.4 oz)   SpO2 97%   BMI 22.53 kg/m  Vital signs in last 24 hours: Temp:  [97.6 F (36.4 C)-98.4 F (36.9 C)] 98 F (36.7 C) (01/22 0754) Pulse Rate:  [61-82] 66 (01/22 0754) Resp:  [14-22] 20 (01/22 0754) BP: (78-115)/(32-65) 98/65 (01/22 0754) SpO2:  [93 %-98 %] 97 % (01/22 0754) General: Awake alert oriented x3 HEENT: Normocephalic atraumatic no oropharyngeal obstruction Cardiovascular: Normal rate rhythm Respiratory: Normal effort nonlabored breathing Neurological exam Patient is awake alert oriented to time place and person.  He is able to provide clear history.  His speech is clear. Cranial nerves: Pupils equal round react light, extraocular movements intact, visual fields full, face symmetric, hearing intact to voice, palate elevates symmetrically, shoulder shrug symmetric, tongue midline. Motor exam: 5/5 upper extremities.  Antigravity in both lower extremities, some pain in the right lower extremity limiting the exam. Sensory: Intact light touch DTRs: 2+ all over Toes downgoing bilaterally Coordination: Intact finger nose finger bilaterally Gait was not tested for patient comfort and safety Medications  Current Facility-Administered Medications:  .  0.9 %  sodium chloride infusion, 250 mL, Intravenous, PRN, Emokpae, Courage, MD .  0.9 %  sodium chloride infusion, , Intravenous, Continuous, Bodenheimer, Charles A, NP, Last Rate: 75 mL/hr at 05/10/17 0405 .  acetaminophen (TYLENOL) tablet 650 mg, 650 mg, Oral, Q6H PRN **OR** acetaminophen (TYLENOL) suppository 650 mg, 650 mg, Rectal, Q6H PRN, Emokpae, Courage, MD .  albuterol (PROVENTIL) (2.5 MG/3ML) 0.083% nebulizer solution 2.5 mg, 2.5 mg, Nebulization, Q2H PRN,  Emokpae, Courage, MD .  chlorhexidine (PERIDEX) 0.12 % solution 15 mL, 15 mL, Mouth Rinse, BID, Samtani, Jai-Gurmukh, MD .  hydrALAZINE (APRESOLINE) injection 10 mg, 10 mg, Intravenous, Q4H PRN, Emokpae, Courage, MD .  levETIRAcetam (KEPPRA) 1,000 mg in sodium chloride 0.9 % 100 mL IVPB, 1,000 mg, Intravenous, Q12H, Aroor, Lanice Schwab, MD, Stopped at 05/10/17 0418 .  MEDLINE mouth rinse, 15 mL, Mouth Rinse, q12n4p, Samtani, Jai-Gurmukh, MD .  multivitamin with minerals tablet 1 tablet, 1 tablet, Oral, Daily, Emokpae, Courage, MD, 1 tablet at 05/09/17 1012 .  ondansetron (ZOFRAN) tablet 4 mg, 4 mg, Oral, Q6H PRN **OR** ondansetron (ZOFRAN) injection 4 mg, 4 mg, Intravenous, Q6H PRN, Denton Brick, Courage, MD, 4 mg at 05/08/17 0709 .  oxyCODONE (Oxy IR/ROXICODONE) immediate release tablet 5 mg, 5 mg, Oral, Q4H PRN, Denton Brick, Courage, MD, 5 mg at 05/09/17 1838 .  phenytoin (DILANTIN) injection 100 mg, 100 mg, Intravenous, Q8H, Ledford, James L, RPH, 100 mg at 05/10/17 0500 .  polyethylene glycol (MIRALAX / GLYCOLAX) packet 17 g, 17 g, Oral, Daily PRN, Emokpae, Courage, MD .  rosuvastatin (CRESTOR) tablet 10 mg, 10 mg, Oral, q1800, Bodenheimer, Charles A, NP, 10 mg at 05/09/17 1755 .  ruxolitinib phosphate (JAKAFI) tablet 5 mg, 5 mg, Oral, BID, Emokpae, Courage, MD, 5 mg at 05/09/17 2218 .  senna (SENOKOT) tablet 8.6 mg, 1 tablet, Oral, BID, Denton Brick, Courage, MD, Stopped at 05/09/17 1017 .  sodium chloride flush (NS) 0.9 % injection 3 mL, 3 mL, Intravenous, Q12H, Emokpae, Courage, MD, 3 mL at 05/09/17 1016 .  sodium chloride flush (NS) 0.9 % injection 3  mL, 3 mL, Intravenous, PRN, Denton Brick, Courage, MD, 3 mL at 05/08/17 2139 .  traZODone (DESYREL) tablet 50 mg, 50 mg, Oral, QHS PRN, Denton Brick, Courage, MD, 50 mg at 05/08/17 2123 Labs CBC    Component Value Date/Time   WBC 14.7 (H) 05/10/2017 0634   RBC 3.78 (L) 05/10/2017 0634   HGB 10.3 (L) 05/10/2017 0634   HCT 32.9 (L) 05/10/2017 0634   PLT 84 (L)  05/10/2017 0634   MCV 87.0 05/10/2017 0634   MCH 27.2 05/10/2017 0634   MCHC 31.3 05/10/2017 0634   RDW 21.8 (H) 05/10/2017 0634   LYMPHSABS 0.8 05/07/2017 0941   MONOABS 0.1 05/07/2017 0941   EOSABS 0.1 05/07/2017 0941   BASOSABS 0.1 05/07/2017 0941    CMP     Component Value Date/Time   NA 137 05/10/2017 0634   K 3.5 05/10/2017 0634   CL 106 05/10/2017 0634   CO2 22 05/10/2017 0634   GLUCOSE 109 (H) 05/10/2017 0634   BUN 8 05/10/2017 0634   CREATININE 0.87 05/10/2017 0634   CALCIUM 8.4 (L) 05/10/2017 0634   PROT 6.7 05/07/2017 0941   ALBUMIN 4.1 05/07/2017 0941   AST 28 05/07/2017 0941   ALT 37 05/07/2017 0941   ALKPHOS 70 05/07/2017 0941   BILITOT 0.9 05/07/2017 0941   GFRNONAA >60 05/10/2017 0634   GFRAA >60 05/10/2017 9449   Imaging I have reviewed images in epic and the results pertinent to this consultation are: CT-scan of the brain done on admission and the repeat CT scan shows stable parafalcine left subdural hematoma.  EEG report reviewed.  Showed generalized slowing.  No focality.  Assessment:  72 year old man with a past history of DVT on Lovenox, myelodysplastic syndrome and chronic thrombocytopenia, chronic anemia presented with left-sided falcine subdural hematoma.  Neurology was consulted for suspected focal seizures with right arm jerking that lasted for 20 minutes. Likely simple partial seizure in setting of SDH.  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 No driving until 6 months seizure-free thank you per [facility]. Seizure precautions. Appreciate neurosurgery recommendations.  Follow-up with neurosurgery as an outpatient. Follow-up with neurology as an outpatient in 4-6 weeks after discharge  -- Amie Portland, MD [facility] Pager: [phone] If 7pm to 7am, please call on call as listed on AMION.

## 2017-05-11 DIAGNOSIS — I824Z1 Acute embolism and thrombosis of unspecified deep veins of right distal lower extremity: Secondary | ICD-10-CM

## 2017-05-11 MED ORDER — QUETIAPINE FUMARATE 50 MG PO TABS: 100.0000 mg | ORAL_TABLET | Freq: Every day | ORAL | Status: DC

## 2017-05-11 MED ORDER — ALPRAZOLAM 0.25 MG PO TABS
0.2500 mg | ORAL_TABLET | Freq: Three times a day (TID) | ORAL | Status: DC | PRN
  Administered 2017-05-11 – 2017-05-13 (×4): 0.25 mg via ORAL
  Filled 2017-05-11 (×4): qty 1

## 2017-05-11 MED ORDER — LEVETIRACETAM 500 MG PO TABS
1000.0000 mg | ORAL_TABLET | Freq: Two times a day (BID) | ORAL | Status: DC
  Administered 2017-05-11 – 2017-05-13 (×4): 1000 mg via ORAL
  Filled 2017-05-11 (×4): qty 2

## 2017-05-11 MED ORDER — ALPRAZOLAM 0.25 MG PO TABS
0.2500 mg | ORAL_TABLET | Freq: Once | ORAL | Status: AC
  Administered 2017-05-11: 0.25 mg via ORAL
  Filled 2017-05-11: qty 1

## 2017-05-11 MED ORDER — PHENYTOIN 50 MG PO CHEW
100.0000 mg | CHEWABLE_TABLET | Freq: Three times a day (TID) | ORAL | Status: DC
  Administered 2017-05-11 (×2): 100 mg via ORAL
  Filled 2017-05-11 (×3): qty 2

## 2017-05-11 MED ORDER — PRAZOSIN HCL 1 MG PO CAPS
1.0000 mg | ORAL_CAPSULE | Freq: Every day | ORAL | Status: DC
  Administered 2017-05-11 – 2017-05-12 (×2): 1 mg via ORAL
  Filled 2017-05-11 (×2): qty 1

## 2017-05-11 NOTE — Progress Notes (Signed)
Patient ID: JOEY LIERMAN, male   DOB: 1945/11/26, 72 y.o.   MRN: [id number] Subjective: I saw the patient at approximately 0 745 this morning.  He was alert and pleasant and denied headaches.  Objective: Vital signs in last 24 hours: Temp:  [97.5 F (36.4 C)-98.1 F (36.7 C)] 97.5 F (36.4 C) (01/23 0749) Pulse Rate:  [59-77] 69 (01/23 1034) Resp:  [16-24] 16 (01/23 1034) BP: (101-119)/(51-83) 119/57 (01/23 1034) SpO2:  [92 %-100 %] 97 % (01/23 1034) Estimated body mass index is 22.53 kg/m as calculated from the following:   Height as of this encounter: 5\' 8"  (1.727 m).   Weight as of this encounter: 67.2 kg (148 lb 2.4 oz).   Intake/Output from previous day: 01/22 0701 - 01/23 0700 In: 2493 [P.O.:360; I.V.:1803; IV Piggyback:330] Out: 450 [Urine:450] Intake/Output this shift: Total I/O In: 360 [P.O.:360] Out: -   Physical exam the patient is alert and oriented x3.  He is moving all 4 extremities well.  His speech is normal.  Lab Results: Recent Labs    05/10/17 0634  WBC 14.7*  HGB 10.3*  HCT 32.9*  PLT 84*   BMET Recent Labs    05/10/17 0634  NA 137  K 3.5  CL 106  CO2 22  GLUCOSE 109*  BUN 8  CREATININE 0.87  CALCIUM 8.4*    Studies/Results: No results found.  Assessment/Plan: Left subdural hematoma: The patient has been clinically stable.  He has not had any more seizures.  LOS: 4 days     Ophelia Charter 05/11/2017, 12:20 PM

## 2017-05-11 NOTE — Progress Notes (Signed)
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  Patient was found to have L subdural hemorrhage + mild mass effect.  Neurosurgery was consulted and the patient was transferred over to this facility.  He developed seizures.  He was given antiepileptic treatment.  He did not require any surgical intervention.  Reason for Visit: Subdural hematoma.  Seizures.  Consultants: Neurosurgery.  Neurology.  Procedures: None  Antibiotics: None  Subjective/Interval History: Patient complained of feeling anxious.  He also mentioned shortness of breath.  No chest pain.  Patient was given anxiolytic with which his symptoms improved.  Patient wife and daughter is at the bedside.  Denies any headaches currently.  ROS: Denies any nausea or vomiting.  Objective:  Vital Signs  Vitals:   05/11/17 0607 05/11/17 0749 05/11/17 1034 05/11/17 1459  BP: 117/76 113/83 (!) 119/57 (!) 123/47  Pulse: 69 74 69 76  Resp: (!) 22 18 16 18   Temp: 97.7 F (36.5 C) (!) 97.5 F (36.4 C)  98.5 F (36.9 C)  TempSrc: Oral Oral  Oral  SpO2: 99% 100% 97% 99%  Weight:      Height:        Intake/Output Summary (Last 24 hours) at 05/11/2017 1518 Last data filed at 05/11/2017 0830 Gross per 24 hour  Intake 2850 ml  Output 450 ml  Net 2400 ml   Filed Weights   05/07/17 0852 05/07/17 2139  Weight: 70.3 kg (155 lb) 67.2 kg (148 lb 2.4 oz)    General appearance: alert, cooperative, appears stated age, distracted and no distress Head: Normocephalic, without obvious abnormality, atraumatic Resp: clear to auscultation bilaterally Cardio: regular rate and rhythm, S1, S2 normal, no murmur, click, rub or gallop GI: soft, non-tender; bowel  sounds normal; no masses,  no organomegaly Extremities: extremities normal, atraumatic, no cyanosis or edema Neurologic: No focal deficits.  Seems to be anxious.  Lab Results:  Data Reviewed: I have personally reviewed following labs and imaging studies  CBC: Recent Labs  Lab 05/07/17 0941 05/07/17 1000 05/10/17 0634  WBC 13.0*  --  14.7*  NEUTROABS 11.9*  --   --   HGB 11.4* 11.9* 10.3*  HCT 36.5* 35.0* 32.9*  MCV 86.9  --  87.0  PLT 76*  --  84*    Basic Metabolic Panel: Recent Labs  Lab 05/07/17 0941 05/07/17 1000 05/10/17 0634  NA 136 139 137  K 4.0 4.0 3.5  CL 107 104 106  CO2 21*  --  22  GLUCOSE 148* 144* 109*  BUN 12 11 8   CREATININE 0.89 0.80 0.87  CALCIUM 8.8*  --  8.4*    GFR: Estimated Creatinine Clearance: 71.9 mL/min (by C-G formula based on SCr of 0.87 mg/dL).  Liver Function Tests: Recent Labs  Lab 05/07/17 0941  AST 28  ALT 37  ALKPHOS 70  BILITOT 0.9  PROT 6.7  ALBUMIN 4.1    Cardiac Enzymes: Recent Labs  Lab 05/07/17 0941  CKTOTAL 36*    CBG: Recent Labs  Lab 05/07/17 0916  GLUCAP 131*    Recent Results (from the past 240 hour(s))  MRSA PCR Screening     Status: None   Collection Time: 05/07/17  9:32 PM  Result Value Ref Range Status  MRSA by PCR NEGATIVE NEGATIVE Final    Comment:        The GeneXpert MRSA Assay (FDA approved for NASAL specimens only), is one component of a comprehensive MRSA colonization surveillance program. It is not intended to diagnose MRSA infection nor to guide or monitor treatment for MRSA infections.       Radiology Studies: No results found.   Medications:  Scheduled: . chlorhexidine  15 mL Mouth Rinse BID  . levETIRAcetam  1,000 mg Oral BID  . mouth rinse  15 mL Mouth Rinse q12n4p  . multivitamin with minerals  1 tablet Oral Daily  . phenytoin  100 mg Oral TID  . rosuvastatin  10 mg Oral q1800  . ruxolitinib phosphate  5 mg Oral BID  . senna  1 tablet Oral BID  . sodium  chloride flush  3 mL Intravenous Q12H   Continuous: . sodium chloride     ZHY:QMVHQI chloride, acetaminophen **OR** acetaminophen, albuterol, hydrALAZINE, ondansetron **OR** ondansetron (ZOFRAN) IV, oxyCODONE, polyethylene glycol, sodium chloride flush, traZODone  Assessment/Plan:  Principal Problem:   Subdural hemorrhage (HCC) Active Problems:   Anticoagulated for Rt Leg DVT   History of DVT of lower extremity 02/2017   Acute deep vein thrombosis (DVT) of distal vein of lower extremity (HCC)   Acute deep vein thrombosis (DVT) of proximal vein of lower extremity (HCC)   Subdural hematoma (HCC)   Chronic obstructive pulmonary disease (HCC)   PTSD (post-traumatic stress disorder)   Seizures (HCC)   Leukocytosis   Thrombocytopenia (HCC)   Anemia of chronic disease    Subdural hemorrhage This was in the setting of anticoagulation being administered for recently diagnosed DVT.  Patient was seen by neurosurgery.  Did not require any surgical intervention.  However patient did have waxing and waning right upper extremity weakness.  There was concern for seizure activity.  Patient seen by neurology.  Started on Keppra and Dilantin.  Symptoms have improved.  He will be changed over to oral antiepileptics.  Diet will be advanced.  Neurological status appears to be stable although he needs rehabilitation.  He will need outpatient follow-up with neurosurgery.  Generalized weakness Patient had falls at home.  Seen by physical and occupational therapy.  Needs rehabilitation.   Acute right lower extremity DVT Initially diagnosed on November 8.  Has been on Lovenox.  Repeat study on 1/20 also showed DVT.  Since anticoagulation is not possible due to subdural hematoma patient underwent IVC filter placement on 1/20.  Will need removal when safe for anticoagulation again.  Myelodysplasia C plus chronic thrombocytopenia/anemia Monitor counts and continue Jakafi as per [facility] hematologist  Bipolar  disorder/situational anxiety Alprazolam as needed.    History of COPD  Stable.  Continue to monitor.    Sacral fracture Pain control.  PT and OT.   DVT Prophylaxis: TED stockings Code Status: Full code Family Communication: Discussed with the patient and his family Disposition Plan: Await insurance approval for inpatient rehabilitation. Await call back from insurance for peer-to-peer review.    LOS: 4 days   [place] [facility] Pager [phone] 05/11/2017, 3:18 PM  If 7PM-7AM, please contact night-coverage at [email or web address], password [facility]

## 2017-05-11 NOTE — Progress Notes (Signed)
Occupational Therapy Treatment Patient Details Name: Carlos Vaughn MRN: [id number] DOB: [birth year] Today's Date: 05/11/2017    History of present illness Pt admit after 2 falls with left SDH.  Also with right LE DVT with plan for possible IVC filter.  Pt also found to have sacral fx - nonoperative per MD.  Pt possibly with seizures on 05/08/17 with w/u in regards to this.  PMH:  COPD, PTSD   OT comments  Pt requires min A for ADLs and functional mobility due to impaired balance, as well as impaired cognition including poor safety awareness and impulsivity.  He is at very high risk for falls, injury, and readmission.  Wife is his primary caregiver with little support, as daughter will be returning to [facility].  Pt and wife have a 74 y.o son, and therefore, wife will not be able to provide necessary level of assist that pt will require over a 24 hour time frame as he will need someone directly with him anytime he is up, cannot be left alone due to cognitive deficits, and impulsivity.  Recommend post acute rehab at discharge.    Follow Up Recommendations  CIR    Equipment Recommendations  3 in 1 bedside commode    Recommendations for Other Services Rehab consult    Precautions / Restrictions Precautions Precautions: Fall Precaution Comments: seizures       Mobility Bed Mobility Overal bed mobility: Needs Assistance Bed Mobility: Supine to Sit;Sit to Supine     Supine to sit: Supervision Sit to supine: Supervision   General bed mobility comments: supervision for safety   Transfers Overall transfer level: Needs assistance Equipment used: Rolling walker (2 wheeled) Transfers: Sit to/from [facility] Sit to Stand: Min assist Stand pivot transfers: Min assist       General transfer comment: assist for balance     Balance Overall balance assessment: Needs assistance Sitting-balance support: Bilateral upper extremity supported;Feet supported Sitting balance-Leahy  Scale: Fair     Standing balance support: Bilateral upper extremity supported;During functional activity;Single extremity supported Standing balance-Leahy Scale: Poor Standing balance comment: relies on UE support for balance                            ADL either performed or assessed with clinical judgement   ADL Overall ADL's : Needs assistance/impaired Eating/Feeding: Set up;Sitting   Grooming: Wash/dry hands;Minimal assistance;Standing Grooming Details (indicate cue type and reason): requires assist to maneuver RW  while standing at sink                  Toilet Transfer: Minimal assistance Toilet Transfer Details (indicate cue type and reason): pt ambulated into bathroom with min A for balance, and min A to maneuver RW  Toileting- Clothing Manipulation and Hygiene: Min guard;Sit to/from Nurse, children's Details (indicate cue type and reason): Pt's wife instructed how to use 3in1 in shower as a shower seat and safe technique for shower.   Functional mobility during ADLs: Minimal assistance;Rolling walker General ADL Comments: Pt is very impulsive with poor safety awareness and decreased balance      Vision   Additional Comments: verbal cues to attend to objects on Rt    Perception     Praxis      Cognition Arousal/Alertness: Awake/alert Behavior During Therapy: Restless;Impulsive;Anxious Overall Cognitive Status: Impaired/Different from baseline Area of Impairment: Attention;Following commands;Memory;Safety/judgement;Awareness;Problem solving  Current Attention Level: Selective Memory: Decreased short-term memory;Decreased recall of precautions Following Commands: Follows one step commands consistently;Follows multi-step commands inconsistently Safety/Judgement: Decreased awareness of deficits;Decreased awareness of safety Awareness: Intellectual Problem Solving: Difficulty sequencing;Requires tactile  cues;Requires verbal cues          Exercises     Shoulder Instructions       General Comments Wife to measure doorways at home.  discussed use of gait belt for safety, and discussed with wife that pt would not drive.   Pt will be primary caregiver with limited supports.  They have a 74 y.o. son so she will not be able to provide the necessary level of assist over a 24 hour period at discharge     Pertinent Vitals/ Pain       Pain Assessment: Faces Faces Pain Scale: Hurts little more Pain Location: sacrum Pain Descriptors / Indicators: Aching Pain Intervention(s): Monitored during session  Home Living                                          Prior Functioning/Environment              Frequency  Min 3X/week        Progress Toward Goals  OT Goals(current goals can now be found in the care plan section)  Progress towards OT goals: Progressing toward goals     Plan Discharge plan remains appropriate    Co-evaluation                 AM-PAC PT "6 Clicks" Daily Activity     Outcome Measure   Help from another person eating meals?: A Little Help from another person taking care of personal grooming?: A Little Help from another person toileting, which includes using toliet, bedpan, or urinal?: A Little Help from another person bathing (including washing, rinsing, drying)?: A Little Help from another person to put on and taking off regular upper body clothing?: A Little Help from another person to put on and taking off regular lower body clothing?: A Little 6 Click Score: 18    End of Session Equipment Utilized During Treatment: Rolling walker  OT Visit Diagnosis: Unsteadiness on feet (R26.81);Ataxia, unspecified (R27.0)   Activity Tolerance Patient tolerated treatment well   Patient Left in bed;with call bell/phone within reach;with bed alarm set;with family/visitor present   Nurse Communication Mobility status        Time:  1225-1310 OT Time Calculation (min): 45 min  Charges: OT General Charges $OT Visit: 1 Visit OT Treatments $Self Care/Home Management : 38-52 mins  [facility], OTR/L [phone]    Lucille Passy M 05/11/2017, 3:13 PM

## 2017-05-11 NOTE — Progress Notes (Signed)
I have contacted Dr. Maryland Pink, RN CM and daughter at bedside that we have a preliminary denial from [facility] for an inpt rehab admit. Dr. Maryland Pink is pursuing a peer to peer appeal with insurance. I will follow up after that peer to peer. [phone]

## 2017-05-11 NOTE — Progress Notes (Signed)
CM met with the patient and his family to go over plan if patient is denied for CIR. Pt ambulated 300 ft and most likely would not qualify for SNF rehab.  Family will take patient home with [facility] services. CM provided them choice and they will look over the list. They are requesting walker, 3 in 1 for home.  [facility] CM encouraged the patients wife to call the [place] and see about having [facility] aides in the home. Wife in agreement. CM will follow up with them in the am.

## 2017-05-11 NOTE — Care Management Important Message (Signed)
Important Message  Patient Details  Name: Carlos Vaughn MRN: [id number] Date of Birth: Dec 30, 1945   Medicare Important Message Given:  Yes    [name] 05/11/2017, 11:51 AM

## 2017-05-11 NOTE — Progress Notes (Signed)
Patient is comp,aing of SOB, denied chest pain, lung sound clear, VS below, pt appears anxious. MD paged for further eval due to his hx.       05/11/17 0749  Vitals  Temp (!) 97.5 F (36.4 C)  Temp Source Oral  BP 113/83  MAP (mmHg) 88  BP Location Left Arm  BP Method Automatic  Patient Position (if appropriate) Lying  Pulse Rate 74  Pulse Rate Source Dinamap  Resp 18  Oxygen Therapy  SpO2 100 %  O2 Device Room Air

## 2017-05-12 DIAGNOSIS — F418 Other specified anxiety disorders: Secondary | ICD-10-CM

## 2017-05-12 MED ORDER — DIPHENHYDRAMINE HCL 25 MG PO CAPS
25.0000 mg | ORAL_CAPSULE | Freq: Once | ORAL | Status: AC
  Administered 2017-05-12: 25 mg via ORAL
  Filled 2017-05-12: qty 1

## 2017-05-12 MED ORDER — PHENYTOIN SODIUM EXTENDED 100 MG PO CAPS
100.0000 mg | ORAL_CAPSULE | Freq: Three times a day (TID) | ORAL | Status: DC
  Administered 2017-05-12 – 2017-05-13 (×4): 100 mg via ORAL
  Filled 2017-05-12 (×4): qty 1

## 2017-05-12 MED ORDER — HYDROCODONE-ACETAMINOPHEN 5-325 MG PO TABS
1.0000 | ORAL_TABLET | Freq: Four times a day (QID) | ORAL | Status: DC | PRN
  Administered 2017-05-12: 1 via ORAL
  Filled 2017-05-12: qty 1

## 2017-05-12 NOTE — Progress Notes (Signed)
Therapy noted received. [phone]

## 2017-05-12 NOTE — Progress Notes (Signed)
I have contacted PT to provide updated treatment notes from today for me to provide to [facility] for possible approval to [place]. [phone]

## 2017-05-12 NOTE — Clinical Social Work Note (Signed)
Clinical Social Work Assessment  Patient Details  Name: Carlos Vaughn MRN: [id number] Date of Birth: [birth year]  Date of referral:  05/12/17               Reason for consult:  Facility Placement                Permission sought to share information with:  Facility Sport and exercise psychologist, Family Supports Permission granted to share information::  Yes, Verbal Permission Granted  Name::     Heritage manager::  SNF  Relationship::  Wife  Contact Information:     Housing/Transportation Living arrangements for the past 2 months:  [place] of Information:  Patient, Spouse Patient Interpreter Needed:  None Criminal Activity/Legal Involvement Pertinent to Current Situation/Hospitalization:  No - Comment as needed Significant Relationships:  Spouse, Adult Children Lives with:  Self, Spouse Do you feel safe going back to the place where you live?  Yes Need for family participation in patient care:  No (Coment)  Care giving concerns:  Patient lives at home with spouse but will benefit from short term rehab at SNF prior to returning home.   Social Worker assessment / plan:  CSW met with patient and spoke to patient's wife over the phone to discuss denial for CIR and to pursue SNF. CSW provided SNF bed offers and wife selected preference of [facility]. CSW confirmed bed available at [facility] and submitted request for approval for SNF. CSW contacted [facility] to check on auth status; auth review is still pending at this time. CSW checked in with Admissions at [facility] to determine ability to accept under LOG pending authorization, and facility is not willing to do that at this time. CSW will continue to follow.  Employment status:  Retired Nurse, adult PT Recommendations:  Inpatient [place] / Referral to community resources:  [place]  Patient/Family's Response to care:  Patient and family agreeable to SNF  placement.  Patient/Family's Understanding of and Emotional Response to Diagnosis, Current Treatment, and Prognosis:  Patient and family discussed how they know they cannot take the patient home and would rather go to SNF at this time. Patient and family appreciative of CSW assistance.  Emotional Assessment Appearance:  Appears stated age Attitude/Demeanor/Rapport:    Affect (typically observed):  Appropriate Orientation:  Oriented to Self, Oriented to Place, Oriented to  Time, Oriented to Situation Alcohol / Substance use:  Not Applicable Psych involvement (Current and /or in the community):  No (Comment)  Discharge Needs  Concerns to be addressed:  Care Coordination Readmission within the last 30 days:  No Current discharge risk:  Physical Impairment, Dependent with Mobility Barriers to Discharge:  Continued Medical Work up, [place], [place] 05/12/2017, 4:41 PM

## 2017-05-12 NOTE — Progress Notes (Signed)
I have received a denial for an inpt rehab admit. I spoke with pt at bedside with his daughter and his wife on the phone. SW, Kathlee Nations, also present. They are aware and wish to pursue SNF at this time. We will sign off. [phone]

## 2017-05-12 NOTE — Progress Notes (Signed)
Patient ID: Carlos Vaughn, male   DOB: 1946-01-26, 72 y.o.   MRN: [id number] Subjective: The patient is alert and pleasant.  He has no complaints.  His daughter is at the bedside.  Objective: Vital signs in last 24 hours: Temp:  [97.4 F (36.3 C)-98.4 F (36.9 C)] 98.4 F (36.9 C) (01/24 1436) Pulse Rate:  [65-75] 75 (01/24 1436) Resp:  [16-18] 16 (01/24 1436) BP: (108-129)/(47-60) 123/48 (01/24 1436) SpO2:  [92 %-99 %] 95 % (01/24 1436) Estimated body mass index is 22.53 kg/m as calculated from the following:   Height as of this encounter: 5\' 8"  (1.727 m).   Weight as of this encounter: 67.2 kg (148 lb 2.4 oz).   Intake/Output from previous day: 01/23 0701 - 01/24 0700 In: 1080 [P.O.:1080] Out: -  Intake/Output this shift: No intake/output data recorded.  Physical exam patient is alert and oriented x3.  He is moving all 4 extremities well.  Lab Results: Recent Labs    05/10/17 0634  WBC 14.7*  HGB 10.3*  HCT 32.9*  PLT 84*   BMET Recent Labs    05/10/17 0634  NA 137  K 3.5  CL 106  CO2 22  GLUCOSE 109*  BUN 8  CREATININE 0.87  CALCIUM 8.4*    Studies/Results: No results found.  Assessment/Plan: Left subdural hematoma: The patient is doing well from a neurosurgical point of view.  Please have him follow-up with me in a few weeks in the office.  Please call if I can be of further assistance.  LOS: 5 days     Ophelia Charter 05/12/2017, 5:32 PM

## 2017-05-12 NOTE — NC FL2 (Signed)
[place] MEDICAID FL2 LEVEL OF CARE SCREENING TOOL     IDENTIFICATION  Patient Name: Carlos Vaughn Birthdate: 1946/03/16 Sex: male Admission Date (Current Location): 05/07/2017  [facility] and [place] Number:  Herbalist and Address:  [facility], [place] [address] [place], [place] [address]      Provider Number: [id number]  Attending Physician Name and Address:  Bonnielee Haff, MD  Relative Name and Phone Number:       Current Level of Care: Hospital Recommended Level of Care: [place] Prior Approval Number:    Date Approved/Denied:   PASRR Number: [id number]  Discharge Plan: SNF    Current Diagnoses: Patient Active Problem List   Diagnosis Date Noted  . Acute deep vein thrombosis (DVT) of distal vein of lower extremity (HCC)   . Acute deep vein thrombosis (DVT) of proximal vein of lower extremity (HCC)   . Subdural hematoma ([place])   . Chronic obstructive pulmonary disease ([place])   . PTSD (post-traumatic stress disorder)   . Seizures ([place])   . Leukocytosis   . Thrombocytopenia ([place])   . Anemia of chronic disease   . Subdural hemorrhage ([place]) 05/07/2017  . Anticoagulated for Rt Leg DVT 05/07/2017  . History of DVT of lower extremity 02/2017 05/07/2017    Orientation RESPIRATION BLADDER Height & Weight     Self, Time, Situation, Place  Normal Continent Weight: 148 lb 2.4 oz (67.2 kg) Height:  5\' 8"  (172.7 cm)  BEHAVIORAL SYMPTOMS/MOOD NEUROLOGICAL BOWEL NUTRITION STATUS    Convulsions/Seizures Continent Diet(mechanical soft)  AMBULATORY STATUS COMMUNICATION OF NEEDS Skin   Limited Assist Verbally Surgical wounds(closed right groin incision, gauze dressing)                       Personal Care Assistance Level of Assistance  Bathing, Feeding, Dressing Bathing Assistance: Limited assistance Feeding assistance: Independent Dressing Assistance: Limited assistance     Functional Limitations Info  Sight,  Hearing, Speech Sight Info: Adequate Hearing Info: Impaired Speech Info: Adequate    SPECIAL CARE FACTORS FREQUENCY  PT (By licensed PT), OT (By licensed OT), Speech therapy     PT Frequency: 5x/wk OT Frequency: 5x/wk     Speech Therapy Frequency: 5x/wk      Contractures Contractures Info: Not present    Additional Factors Info  Code Status, Allergies Code Status Info: Full Allergies Info: Oxycodone           Current Medications (05/12/2017):  This is the current hospital active medication list Current Facility-Administered Medications  Medication Dose Route Frequency Provider Last Rate Last Dose  . 0.9 %  sodium chloride infusion  250 mL Intravenous PRN Emokpae, Courage, MD      . acetaminophen (TYLENOL) tablet 650 mg  650 mg Oral Q6H PRN Denton Brick, Courage, MD   650 mg at 05/11/17 1516   Or  . acetaminophen (TYLENOL) suppository 650 mg  650 mg Rectal Q6H PRN Emokpae, Courage, MD      . albuterol (PROVENTIL) (2.5 MG/3ML) 0.083% nebulizer solution 2.5 mg  2.5 mg Nebulization Q2H PRN Emokpae, Courage, MD   2.5 mg at 05/10/17 2240  . ALPRAZolam Duanne Moron) tablet 0.25 mg  0.25 mg Oral TID PRN Bonnielee Haff, MD   0.25 mg at 05/12/17 0956  . chlorhexidine (PERIDEX) 0.12 % solution 15 mL  15 mL Mouth Rinse BID Nita Sells, MD   15 mL at 05/12/17 0956  . hydrALAZINE (APRESOLINE) injection 10 mg  10 mg Intravenous Q4H PRN Emokpae, Courage, MD      . HYDROcodone-acetaminophen (NORCO/VICODIN) 5-325 MG per tablet 1 tablet  1 tablet Oral Q6H PRN Bonnielee Haff, MD      . levETIRAcetam (KEPPRA) tablet 1,000 mg  1,000 mg Oral BID Bonnielee Haff, MD   1,000 mg at 05/12/17 0956  . MEDLINE mouth rinse  15 mL Mouth Rinse q12n4p Nita Sells, MD   15 mL at 05/12/17 1148  . multivitamin with minerals tablet 1 tablet  1 tablet Oral Daily Roxan Hockey, MD   1 tablet at 05/12/17 0956  . ondansetron (ZOFRAN) tablet 4 mg  4 mg Oral Q6H PRN Emokpae, Courage, MD       Or  .  ondansetron (ZOFRAN) injection 4 mg  4 mg Intravenous Q6H PRN Denton Brick, Courage, MD   4 mg at 05/08/17 0709  . phenytoin (DILANTIN) ER capsule 100 mg  100 mg Oral TID Bonnielee Haff, MD   100 mg at 05/12/17 0956  . polyethylene glycol (MIRALAX / GLYCOLAX) packet 17 g  17 g Oral Daily PRN Denton Brick, Courage, MD   17 g at 05/10/17 2207  . prazosin (MINIPRESS) capsule 1 mg  1 mg Oral QHS Bonnielee Haff, MD   1 mg at 05/11/17 2155  . rosuvastatin (CRESTOR) tablet 10 mg  10 mg Oral q1800 Bodenheimer, Charles A, NP   10 mg at 05/11/17 1712  . ruxolitinib phosphate (JAKAFI) tablet 5 mg  5 mg Oral BID Denton Brick, Courage, MD   5 mg at 05/12/17 1003  . senna (SENOKOT) tablet 8.6 mg  1 tablet Oral BID Denton Brick, Courage, MD   8.6 mg at 05/12/17 0956  . sodium chloride flush (NS) 0.9 % injection 3 mL  3 mL Intravenous Q12H Emokpae, Courage, MD   3 mL at 05/12/17 0957  . sodium chloride flush (NS) 0.9 % injection 3 mL  3 mL Intravenous PRN Roxan Hockey, MD   3 mL at 05/08/17 2139  . traZODone (DESYREL) tablet 50 mg  50 mg Oral QHS PRN Roxan Hockey, MD   50 mg at 05/11/17 2141     Discharge Medications: Please see discharge summary for a list of discharge medications.  Relevant Imaging Results:  Relevant Lab Results:   Additional Information SS#: [id number]  Geralynn Ochs, LCSW

## 2017-05-12 NOTE — Progress Notes (Signed)
Physical Therapy Treatment Patient Details Name: Carlos Vaughn MRN: [id number] DOB: March 16, 1946 Today's Date: 05/12/2017    History of Present Illness Pt admit after 2 falls with left SDH.  Also with right LE DVT with plan for possible IVC filter.  Pt also found to have sacral fx - nonoperative per MD.  Pt possibly with seizures on 05/08/17 with w/u in regards to this.  PMH:  COPD, PTSD    PT Comments    Patient received up and walking with family in the room; he was able to complete toileting based activities with S to min guard for safety today. Wife/family is very concerned about him returning home due to needing to navigate surfaces such as pine needles/gravel/stepping stones to get to the deck, where he would need to navigate stairs; they are also concerned about his general status, as they report he is usually quite an over-achiever but he has just been very fatigued lately. He is able to perform all functional mobility and gait with assistance level as listed below, however continues to be limited in safety awareness, functional activity tolerance, gross weakness, impaired functional balance, and difficulty in walking/stair navigation. PT continues to recommend CIR moving forward especially considering patient's prior level of function as compared to current functional level. He was left sitting at EOB with family present, all needs otherwise met at this time.     Follow Up Recommendations  CIR;Supervision/Assistance - 24 hour     Equipment Recommendations  Other (comment)(TBA )    Recommendations for Other Services Rehab consult     Precautions / Restrictions Precautions Precautions: Fall Precaution Comments: seizures Restrictions Weight Bearing Restrictions: No    Mobility  Bed Mobility               General bed mobility comments: DNT, received up and walking to bathroom with family   Transfers Overall transfer level: Needs assistance Equipment used: Rolling walker (2  wheeled) Transfers: Sit to/from Stand Sit to Stand: Min guard         General transfer comment: Min guard for safety, cues for safety and sequencing   Ambulation/Gait Ambulation/Gait assistance: Min guard Ambulation Distance (Feet): 160 Feet(70fx2) Assistive device: Rolling walker (2 wheeled) Gait Pattern/deviations: Step-through pattern;Decreased stride length;Drifts right/left;Trunk flexed     General Gait Details: patient with much improved balance today but very much limited by fatigue following distance of 868fto neuro gym; required rest break prior to ambulation back to room   Stairs Stairs: Yes   Stair Management: Two rails;Forwards   3 steps     Wheelchair Mobility    Modified Rankin (Stroke Patients Only)       Balance Overall balance assessment: Needs assistance Sitting-balance support: Bilateral upper extremity supported;Feet supported Sitting balance-Leahy Scale: Good     Standing balance support: Bilateral upper extremity supported;During functional activity Standing balance-Leahy Scale: Fair Standing balance comment: relies on UE support for balance                             Cognition Arousal/Alertness: Awake/alert Behavior During Therapy: Impulsive Overall Cognitive Status: Impaired/Different from baseline Area of Impairment: Attention;Following commands;Memory;Safety/judgement;Awareness;Problem solving                   Current Attention Level: Selective Memory: Decreased short-term memory;Decreased recall of precautions Following Commands: Follows one step commands consistently;Follows multi-step commands inconsistently Safety/Judgement: Decreased awareness of deficits;Decreased awareness of safety Awareness: Intellectual Problem Solving:  Difficulty sequencing;Requires tactile cues;Requires verbal cues General Comments: continues to require cues for sequencing and safety, but seems to be improving as he was able to find  his room on the way back from walking today       Exercises      General Comments General comments (skin integrity, edema, etc.): extensive discussion of family/patient concerns regarding function; wife/daughter report that patient will often report he is doing better than he really is, they are concerned about him being home as sometimes he would need to be alone, as well as patient's ability to enter/exit the home due to needing to navigate gravel/unsteady surfaces       Pertinent Vitals/Pain Pain Assessment: Faces Pain Score: 0-No pain Faces Pain Scale: No hurt Pain Intervention(s): Limited activity within patient's tolerance;Monitored during session    Home Living                      Prior Function            PT Goals (current goals can now be found in the care plan section) Acute Rehab PT Goals Patient Stated Goal: to gohome PT Goal Formulation: With patient Time For Goal Achievement: 05/23/17 Potential to Achieve Goals: Good Progress towards PT goals: Progressing toward goals    Frequency    Min 4X/week      PT Plan Current plan remains appropriate    Co-evaluation              AM-PAC PT "6 Clicks" Daily Activity  Outcome Measure  Difficulty turning over in bed (including adjusting bedclothes, sheets and blankets)?: Unable Difficulty moving from lying on back to sitting on the side of the bed? : Unable Difficulty sitting down on and standing up from a chair with arms (e.g., wheelchair, bedside commode, etc,.)?: Unable Help needed moving to and from a bed to chair (including a wheelchair)?: A Little Help needed walking in hospital room?: A Little Help needed climbing 3-5 steps with a railing? : A Little 6 Click Score: 12    End of Session Equipment Utilized During Treatment: Gait belt Activity Tolerance: Patient limited by fatigue Patient left: with call bell/phone within reach;with family/visitor present;in bed   PT Visit Diagnosis:  Muscle weakness (generalized) (M62.81);Unsteadiness on feet (R26.81)     Time: 1898-4210 PT Time Calculation (min) (ACUTE ONLY): 24 min  Charges:  $Gait Training: 8-22 mins $Therapeutic Activity: 8-22 mins                    G Codes:       Deniece Ree PT, DPT, CBIS  Supplemental Physical Therapist [place]   Pager [phone]

## 2017-05-12 NOTE — Progress Notes (Signed)
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  Patient was found to have L subdural hemorrhage + mild mass effect.  Neurosurgery was consulted and the patient was transferred over to this facility.  He developed seizures.  He was given antiepileptic treatment.  He did not require any surgical intervention.  Reason for Visit: Subdural hematoma.  Seizures.  Consultants: Neurosurgery.  Neurology.  Procedures: None  Antibiotics: None  Subjective/Interval History: Patient continues to complain of feeling anxious at times.  Controlled with medications.  Denies any other complaints.  No headaches.  Family members had multiple questions which were answered.    ROS: Denies any nausea or vomiting.  Objective:  Vital Signs  Vitals:   05/11/17 2117 05/12/17 0600 05/12/17 1032 05/12/17 1436  BP: (!) 129/58 (!) 110/49 (!) 120/47 (!) 123/48  Pulse: 72 73 65 75  Resp: 18 16 16 16   Temp: 97.7 F (36.5 C) 97.6 F (36.4 C) 98.4 F (36.9 C) 98.4 F (36.9 C)  TempSrc: Oral Oral Oral Oral  SpO2: 99% 92% 98% 95%  Weight:      Height:        Intake/Output Summary (Last 24 hours) at 05/12/2017 1459 Last data filed at 05/11/2017 1847 Gross per 24 hour  Intake 360 ml  Output -  Net 360 ml   Filed Weights   05/07/17 0852 05/07/17 2139  Weight: 70.3 kg (155 lb) 67.2 kg (148 lb 2.4 oz)    General appearance: Awake alert.  Anxious.  No distress. Resp: Clear to auscultation bilaterally Cardio: S1-S2 is normal regular.  No S3-S4.  No rubs murmurs or bruit GI: Abdomen soft nontender nondistended. Extremities: no Edema Neurologic: No focal deficits.  Seems to be anxious.  Lab Results:  Data Reviewed: I have  personally reviewed following labs and imaging studies  CBC: Recent Labs  Lab 05/07/17 0941 05/07/17 1000 05/10/17 0634  WBC 13.0*  --  14.7*  NEUTROABS 11.9*  --   --   HGB 11.4* 11.9* 10.3*  HCT 36.5* 35.0* 32.9*  MCV 86.9  --  87.0  PLT 76*  --  84*    Basic Metabolic Panel: Recent Labs  Lab 05/07/17 0941 05/07/17 1000 05/10/17 0634  NA 136 139 137  K 4.0 4.0 3.5  CL 107 104 106  CO2 21*  --  22  GLUCOSE 148* 144* 109*  BUN 12 11 8   CREATININE 0.89 0.80 0.87  CALCIUM 8.8*  --  8.4*    GFR: Estimated Creatinine Clearance: 71.9 mL/min (by C-G formula based on SCr of 0.87 mg/dL).  Liver Function Tests: Recent Labs  Lab 05/07/17 0941  AST 28  ALT 37  ALKPHOS 70  BILITOT 0.9  PROT 6.7  ALBUMIN 4.1    Cardiac Enzymes: Recent Labs  Lab 05/07/17 0941  CKTOTAL 36*    CBG: Recent Labs  Lab 05/07/17 0916  GLUCAP 131*    Recent Results (from the past 240 hour(s))  MRSA PCR Screening     Status: None   Collection Time: 05/07/17  9:32 PM  Result Value Ref Range Status   MRSA by PCR NEGATIVE NEGATIVE Final    Comment:        The GeneXpert MRSA Assay (FDA approved  for NASAL specimens only), is one component of a comprehensive MRSA colonization surveillance program. It is not intended to diagnose MRSA infection nor to guide or monitor treatment for MRSA infections.       Radiology Studies: No results found.   Medications:  Scheduled: . chlorhexidine  15 mL Mouth Rinse BID  . levETIRAcetam  1,000 mg Oral BID  . mouth rinse  15 mL Mouth Rinse q12n4p  . multivitamin with minerals  1 tablet Oral Daily  . phenytoin  100 mg Oral TID  . prazosin  1 mg Oral QHS  . rosuvastatin  10 mg Oral q1800  . ruxolitinib phosphate  5 mg Oral BID  . senna  1 tablet Oral BID  . sodium chloride flush  3 mL Intravenous Q12H   Continuous: . sodium chloride     OMV:EHMCNO chloride, acetaminophen **OR** acetaminophen, albuterol, ALPRAZolam, hydrALAZINE,  HYDROcodone-acetaminophen, ondansetron **OR** ondansetron (ZOFRAN) IV, polyethylene glycol, sodium chloride flush, traZODone  Assessment/Plan:  Principal Problem:   Subdural hemorrhage (HCC) Active Problems:   Anticoagulated for Rt Leg DVT   History of DVT of lower extremity 02/2017   Acute deep vein thrombosis (DVT) of distal vein of lower extremity (HCC)   Acute deep vein thrombosis (DVT) of proximal vein of lower extremity (HCC)   Subdural hematoma (HCC)   Chronic obstructive pulmonary disease (HCC)   PTSD (post-traumatic stress disorder)   Seizures (HCC)   Leukocytosis   Thrombocytopenia (HCC)   Anemia of chronic disease    Subdural hemorrhage This was in the setting of anticoagulation being administered for recently diagnosed DVT.  Patient was seen by neurosurgery.  Did not require any surgical intervention.  However patient did have waxing and waning right upper extremity weakness.  There was concern for seizure activity.  Patient seen by neurology.  Started on Keppra and Dilantin.  Symptoms have improved.  He was changed over to oral antiepileptics.  Neurological status appears to be stable.  Unfortunately insurance did not approve him for inpatient rehabilitation.  Social worker to discuss with family if they would like to pursue skilled nursing facility placement. He will need outpatient follow-up with neurosurgery.  Generalized weakness Patient had falls at home.  Seen by physical and occupational therapy.  Needs rehabilitation.   Acute right lower extremity DVT Initially diagnosed on November 8.  Has been on Lovenox.  Repeat study on 1/20 also showed DVT.  Since anticoagulation is not possible due to subdural hematoma patient underwent IVC filter placement on 1/20.  Will need removal when safe for anticoagulation again.  Myelodysplasia C plus chronic thrombocytopenia/anemia Continue Jakafi as per [facility] hematologist  Bipolar disorder/situational anxiety Alprazolam as needed.     History of COPD  Stable.  Continue to monitor.    Sacral fracture Pain control.  PT and OT.   DVT Prophylaxis: TED stockings Code Status: Full code Family Communication: Discussed with the patient and his family Disposition Plan: Possibly to skilled nursing facility.    LOS: 5 days   [place] [facility] Pager [phone] 05/12/2017, 2:59 PM  If 7PM-7AM, please contact night-coverage at [email or web address], password [facility]

## 2017-05-13 MED ORDER — HYDROCODONE-ACETAMINOPHEN 5-325 MG PO TABS: 1.0000 | ORAL_TABLET | Freq: Four times a day (QID) | ORAL | 0 refills | Status: DC | PRN

## 2017-05-13 MED ORDER — DIPHENHYDRAMINE HCL 25 MG PO CAPS: 25.0000 mg | ORAL_CAPSULE | Freq: Three times a day (TID) | ORAL | 0 refills | Status: DC | PRN

## 2017-05-13 MED ORDER — DIPHENHYDRAMINE HCL 25 MG PO CAPS
25.0000 mg | ORAL_CAPSULE | Freq: Three times a day (TID) | ORAL | Status: DC | PRN
  Administered 2017-05-13: 25 mg via ORAL
  Filled 2017-05-13: qty 1

## 2017-05-13 MED ORDER — TRAZODONE HCL 50 MG PO TABS: 50.0000 mg | ORAL_TABLET | Freq: Every evening | ORAL | 0 refills | Status: DC | PRN

## 2017-05-13 MED ORDER — ALPRAZOLAM 0.25 MG PO TABS: 0.2500 mg | ORAL_TABLET | Freq: Three times a day (TID) | ORAL | 0 refills | Status: DC | PRN

## 2017-05-13 MED ORDER — LEVETIRACETAM 1000 MG PO TABS: 1000.0000 mg | ORAL_TABLET | Freq: Two times a day (BID) | ORAL | Status: DC

## 2017-05-13 MED ORDER — PHENYTOIN SODIUM EXTENDED 100 MG PO CAPS: 100.0000 mg | ORAL_CAPSULE | Freq: Three times a day (TID) | ORAL | Status: DC

## 2017-05-13 NOTE — Care Management Important Message (Signed)
Important Message  Patient Details  Name: JOSEF TOURIGNY MRN: [id number] Date of Birth: 1945/07/25   Medicare Important Message Given:  Yes    Barb Merino Tinita Brooker 05/13/2017, 2:27 PM

## 2017-05-13 NOTE — Clinical Social Work Placement (Signed)
Nurse to call report to [phone], Room [place]  NOTE  Date:  05/13/2017  Patient Details  Name: Carlos Vaughn MRN: [id number] Date of Birth: 1946/02/17  Clinical Social Work is seeking post-discharge placement for this patient at the [place] level of care (*CSW will initial, date and re-position this form in  chart as items are completed):  Yes   Patient/family provided with [place] Work Department's list of facilities offering this level of care within the geographic area requested by the patient (or if unable, by the patient's family).  Yes   Patient/family informed of their freedom to choose among providers that offer the needed level of care, that participate in Medicare, Medicaid or managed care program needed by the patient, have an available bed and are willing to accept the patient.  Yes   Patient/family informed of [facility]'s ownership interest in [facility] and [facility], as well as of the fact that they are under no obligation to receive care at these facilities.  PASRR submitted to EDS on 05/12/17     PASRR number received on 05/12/17     Existing PASRR number confirmed on       FL2 transmitted to all facilities in geographic area requested by pt/family on 05/12/17     FL2 transmitted to all facilities within larger geographic area on       Patient informed that his/her managed care company has contracts with or will negotiate with certain facilities, including the following:        Yes   Patient/family informed of bed offers received.  Patient chooses bed at [facility]     Physician recommends and patient chooses bed at      Patient to be transferred to [facility] on 05/13/17.  Patient to be transferred to facility by [facility]     Patient family notified on 05/13/17 of transfer.  Name of family member notified:  [name]     PHYSICIAN        Additional Comment:    _______________________________________________ Geralynn Ochs, LCSW 05/13/2017, 12:06 PM

## 2017-05-13 NOTE — Care Management Note (Signed)
Case Management Note  Patient Details  Name: CHRISS MANNAN MRN: [id number] Date of Birth: 1946/03/28  Subjective/Objective:                    Action/Plan: Pt discharging to [facility] today. Pts wife asked that his new medications be faxed to Dr Carie Caddy at the [facility]. CM faxed them a d/c summary. No further needs per CM.  Expected Discharge Date:  05/13/17               Expected Discharge Plan:  IP Rehab Facility  In-House Referral:  Clinical Social Work  Discharge planning Services  CM Consult  Post Acute Care Choice:  NA Choice offered to:  NA  DME Arranged:  N/A DME Agency:  NA  HH Arranged:  NA HH Agency:  NA  Status of Service:  Completed, signed off  If discussed at [employer], dates discussed:    Additional Comments:  Pollie Friar, RN 05/13/2017, 1:39 PM

## 2017-05-13 NOTE — Discharge Instructions (Signed)
Seizure, Adult When you have a seizure:  Parts of your body may move.  How aware or awake (conscious) you are may change.  You may shake (convulse).  Some people have symptoms right before a seizure happens. These symptoms may include:  Fear.  Worry (anxiety).  Feeling like you are going to throw up (nausea).  Feeling like the room is spinning (vertigo).  Feeling like you saw or heard something before (deja vu).  Odd tastes or smells.  Changes in vision, such as seeing flashing lights or spots.  Seizures usually last from 30 seconds to 2 minutes. Usually, they are not harmful unless they last a long time. Follow these instructions at home: Medicines  Take over-the-counter and prescription medicines only as told by your doctor.  Avoid anything that may keep your medicine from working, such as alcohol. Activity  Do not do any activities that would be dangerous if you had another seizure, like driving or swimming. Wait until your doctor approves.  If you live in the [place] ask your local DMV (department of motor vehicles) when you can drive.  Rest. Teaching others  Teach friends and family what to do when you have a seizure. They should: ? Lay you on the ground. ? Protect your head and body. ? Loosen any tight clothing around your neck. ? Turn you on your side. ? Stay with you until you are better. ? Not hold you down. ? Not put anything in your mouth. ? Know whether or not you need emergency care. General instructions  Contact your doctor each time you have a seizure.  Avoid anything that gives you seizures.  Keep a seizure diary. Write down: ? What you think caused each seizure. ? What you remember about each seizure.  Keep all follow-up visits as told by your doctor. This is important. Contact a doctor if:  You have another seizure.  You have seizures more often.  There is any change in what happens during your seizures.  You continue to have  seizures with treatment.  You have symptoms of being sick or having an infection. Get help right away if:  You have a seizure: ? That lasts longer than 5 minutes. ? That is different than seizures you had before. ? That makes it harder to breathe. ? After you hurt your head.  After a seizure, you cannot speak or use a part of your body.  After a seizure, you are confused or have a bad headache.  You have two or more seizures in a row.  You are having seizures more often.  You do not wake up right after a seizure.  You get hurt during a seizure. In an emergency:  These symptoms may be an emergency. Do not wait to see if the symptoms will go away. Get medical help right away. Call your local emergency services (911 in the [place] Do not drive yourself to the hospital. This information is not intended to replace advice given to you by your health care provider. Make sure you discuss any questions you have with your health care provider. Document Released: 09/22/2007 Document Revised: 12/17/2015 Document Reviewed: 12/17/2015 Elsevier Interactive Patient Education  2017 [place].   Subdural Hematoma A subdural hematoma is a collection of blood between the brain and its tough outer covering (dura). As the amount of blood increases, it puts pressure on the brain. There are two types of subdural hematomas:  Acute. This type develops shortly after a hard, direct hit (  blow) to the head and causes blood to collect very quickly. This is a medical emergency. If it is not diagnosed and treated quickly, it can lead to severe brain injury or death.  Chronic. This is when bleeding develops more slowly, over weeks or months.  What are the causes? This condition is caused by bleeding (hemorrhage) from a broken (ruptured) blood vessel. In most cases, a blood vessel ruptures and bleeds because of injury (trauma) to the head, such as from a hard, direct hit. Head trauma can happen in:  Traffic  accidents.  Falls.  Assaults.  Sport injuries.  In rare cases, hemorrhage can happen without a known cause (spontaneously), especially if you take blood thinners (anticoagulants). What increases the risk? This condition is more likely to develop in:  Older people.  Infants.  People who take blood thinners.  People who have injured their head.  People who abuse alcohol.  What are the signs or symptoms? Depending on the size of the hematoma, symptoms can vary from mild to severe and life-threatening. Symptoms in acute subdural hematoma can develop over minutes or hours. Symptoms in chronic subdural hematoma may develop over weeks or months.  Headaches.  Nausea or vomiting.  Changes in vision, such as double vision or loss of vision.  Changes in speech.  Loss of balance or difficulty walking.  Weakness, numbness, or tingling in the arms or legs on one side of the body.  Jerky movements that you cannot control (seizures).  Change in personality.  Increased sleepiness.  Memory loss.  Loss of consciousness.  Coma.  How is this diagnosed? This condition is diagnosed based on the results of:  A physical and neurological exam.  CT scan.  MRI.  How is this treated? Treatment for this condition depends on the severity and the type of subdural hematoma that you have. You may need to temporarily stop taking blood thinners, if this applies. You may be given antiseizure (anticonvulsant) medicine. Treatment for acute subdural hematoma may include:  Medicines that help the body get rid of excess fluids (diuretics). These may help reduce pressure in the brain.  Assisted breathing (ventilation). This involves using a machine called a ventilator to help you breathe. This helps to reduce pressure in the brain, especially if there is swelling of the brain.  Emergency surgery to drain blood or remove a blood clot.  Treatment for chronic subdural hematoma may  include:  Observation and bed rest at the hospital.  Emergency surgery. This may be done if the bleeding is large, or if you have neurological symptoms such as weakness or numbness.  Sometimes, no treatment is needed for chronic subdural hematoma. Follow these instructions at home: Activity  Avoid any situation where there is potential for another head injury, such as football, hockey, soccer, basketball, martial arts, downhill snow sports, and horseback riding. Do not do these activities until your health care provider approves. ? If you play a contact sport and you experience a head injury, follow advice from your health care provider about when you can return to the sport. If you get another injury while you are healing, you may experience another hemorrhage.  Avoid excessive visual stimulation while recovering. This includes working on the computer, watching TV, and reading.  Try to avoid activities that cause physical or mental stress. Stay home from work or school as directed by your health care provider.  Do not drive, ride a bicycle, or use heavy machinery until your health care provider approves.  Do not lift anything that is heavier than 5 lb (2.3 kg) until your health care provider approves.  If physical therapy was prescribed, do exercises as told by your health care provider or physical therapist.  Rest as told by your health care provider. Rest helps the brain to heal.  Make sure you: ? Get plenty of sleep. Avoid staying up late at night. ? Keep a consistent sleep schedule. Try to go to sleep and wake up at about the same time every day. General instructions  Recovery from brain injuries varies widely. Talk with your health care provider about what to expect. Monitor your symptoms, and ask people around you to do the same.  Take over-the-counter and prescription medicines only as told by your health care provider. Do not take blood thinners or NSAIDs unless your health  care provider approves. This includes aspirin, ibuprofen, naproxen, and warfarin.  Limit alcohol intake to no more than 1 drink per day for nonpregnant women and 2 drinks per day for men. One drink equals 12 oz of beer, 5 oz of wine, or 1 oz of hard liquor.  Keep all follow-up visits as told by your health care provider. This is important. How is this prevented?  Wear protective gear, such as helmets, when participating in activities such as biking or contact sports.  Always wear a seat belt when you are in a motor vehicle.  Keep your home environment safe to reduce the risk of falling: ? Remove clutter and tripping hazards from floors and stairways, such as loose rugs and extension cords. ? Use grab bars in bathrooms and handrails by stairs. ? Place non-slip mats on floors and in bathtubs. ? Improve lighting in dim areas. Where to find more information:  [employer] of Neurological Disorders and Stroke: MasterBoxes.it  American Association of Neurological Surgeons: http://www.aans.org  American Academy of Neurology (AAN): http://keith.biz/  Brain Injury Association of America: www.biausa.org Get help right away if:  You develop symptoms of subdural hematoma.  You are taking blood thinners and you fall or you experience minor trauma to the head. If you take any blood thinners, even a very small injury can cause a subdural hematoma. You should get help right away, even if you think your symptoms are mild.  You have a bleeding disorder and you fall or you experience minor trauma to the head.  You experience a head injury and you develop any of the following symptoms: ? Clear fluid draining from your nose or ears. ? Nausea. ? Vomiting. ? Slurred speech. ? Seizures. ? Drowsiness or a decrease in alertness. ? Double vision. ? Numbness or inability to move (paralysis) in any part of your body. ? Difficulty walking or poor coordination. ? Difficulty thinking. ? Confusion or  forgetfulness. ? Personality changes. ? Irrational or aggressive behavior. ? A history of heavy alcohol use. These symptoms may represent a serious problem that is an emergency. Do not wait to see if the symptoms will go away. Get medical help right away. Call your local emergency services (911 in the [place] Do not drive yourself to the hospital. Summary  A subdural hematoma is a collection of blood between the brain and its tough outer covering.  Treatment for this condition depends on the severity and the type of subdural hemorrhage that you have.  Symptoms can vary from mild to severe and life-threatening.  Monitor your symptoms, and ask others around you to do the same. This information is not intended to replace advice  given to you by your health care provider. Make sure you discuss any questions you have with your health care provider. Document Released: 02/21/2004 Document Revised: 03/10/2016 Document Reviewed: 03/10/2016 Elsevier Interactive Patient Education  [employer].

## 2017-05-13 NOTE — Progress Notes (Signed)
Occupational Therapy Treatment Patient Details Name: Carlos Vaughn MRN: [id number] DOB: 06/11/1945 Today's Date: 05/13/2017    History of present illness Pt admit after 2 falls with left SDH.  Also with right LE DVT with plan for possible IVC filter.  Pt also found to have sacral fx - nonoperative per MD.  Pt possibly with seizures on 05/08/17 with w/u in regards to this.  PMH:  COPD, PTSD   OT comments  Pt progressing towards goals, completing grooming ADLs standing at sink with family supervision upon entering room. Pt completing grooming ADLs, toileting, and room/hallway functional mobility at RW level with MinGuard assist throughout. Pt continues to demonstrate some impulsivity and decreased safety awareness requiring cues. Per chart review Pt denied from CIR level therapies. Updated d/c recommendations to reflect. Pt will benefit from SNF level therapies prior to return home to maximize his safety and independence with ADLs and mobility. Will continue to follow acutely.    Follow Up Recommendations  SNF    Equipment Recommendations  3 in 1 bedside commode          Precautions / Restrictions Precautions Precautions: Fall;Other (comment) Precaution Comments: seizures Restrictions Weight Bearing Restrictions: No       Mobility Bed Mobility Overal bed mobility: Modified Independent       Supine to sit: Modified independent (Device/Increase time) Sit to supine: Modified independent (Device/Increase time)   General bed mobility comments: Pt OOB upon entering room  Transfers Overall transfer level: Needs assistance Equipment used: Rolling walker (2 wheeled) Transfers: Sit to/from Stand Sit to Stand: Min guard Stand pivot transfers: Min guard       General transfer comment: min guard for safety    Balance Overall balance assessment: Needs assistance Sitting-balance support: Feet supported;No upper extremity supported Sitting balance-Leahy Scale: Good     Standing  balance support: During functional activity;Bilateral upper extremity supported Standing balance-Leahy Scale: Fair Standing balance comment: maintains static standing without UE support with MinGuard                            ADL either performed or assessed with clinical judgement   ADL Overall ADL's : Needs assistance/impaired     Grooming: Brushing hair;Min guard;Standing                   Toilet Transfer: Min guard;Ambulation;Regular Toilet;Grab bars;RW   Toileting- Water quality scientist and Hygiene: Min guard;Sit to/from stand Toileting - Clothing Manipulation Details (indicate cue type and reason): close guard for safety during clothing management      Functional mobility during ADLs: Min guard;Rolling walker General ADL Comments: Pt standing at sink with spouse/daughter upon entering room completing grooming ADLs; Pt completing ADLs and requesting to perform hallway mobility, completing at RW level through unit with MinGuard, provided increased challenge reading room numbers during mobility with Pt completing with continued MinGuard assist, intermittent safety cues for maintaining UE placement on RW                       Cognition Arousal/Alertness: Awake/alert Behavior During Therapy: Impulsive Overall Cognitive Status: Impaired/Different from baseline Area of Impairment: Attention;Problem solving;Awareness;Safety/judgement                   Current Attention Level: Selective Memory: Decreased short-term memory;Decreased recall of precautions Following Commands: Follows one step commands consistently;Follows multi-step commands inconsistently;Follows multi-step commands with increased time Safety/Judgement: Decreased awareness of safety  Problem Solving: Difficulty sequencing;Requires verbal cues General Comments: continues to require cues for sequencing and safety, but seems to be improving as he was able to find his room on the way back  from walking today; Pt verbalizing reason for being in hospital without prompting or requiring assist                     General Comments Pt's spouse/daughter present during session     Pertinent Vitals/ Pain       Pain Assessment: No/denies pain                                                          Frequency  Min 3X/week        Progress Toward Goals  OT Goals(current goals can now be found in the care plan section)  Progress towards OT goals: Progressing toward goals  Acute Rehab OT Goals Patient Stated Goal: rehab then home OT Goal Formulation: With patient Time For Goal Achievement: 05/24/17 Potential to Achieve Goals: Good  Plan Discharge plan needs to be updated                     AM-PAC PT "6 Clicks" Daily Activity     Outcome Measure   Help from another person eating meals?: A Little Help from another person taking care of personal grooming?: A Little Help from another person toileting, which includes using toliet, bedpan, or urinal?: A Little Help from another person bathing (including washing, rinsing, drying)?: A Little Help from another person to put on and taking off regular upper body clothing?: A Little Help from another person to put on and taking off regular lower body clothing?: A Little 6 Click Score: 18    End of Session Equipment Utilized During Treatment: Rolling walker  OT Visit Diagnosis: Unsteadiness on feet (R26.81);Ataxia, unspecified (R27.0)   Activity Tolerance Patient tolerated treatment well   Patient Left with call bell/phone within reach;with family/visitor present;Other (comment)(seated EOB )   Nurse Communication Mobility status        Time: 1210-1223 OT Time Calculation (min): 13 min  Charges: OT General Charges $OT Visit: 1 Visit OT Treatments $Therapeutic Activity: 8-22 mins  Carlos Vaughn, [place] Pager [phone] 05/13/2017    Carlos Vaughn 05/13/2017, 1:33  PM

## 2017-05-13 NOTE — Progress Notes (Signed)
Physical Therapy Treatment Patient Details Name: Carlos Vaughn MRN: [id number] DOB: [birth year] Today's Date: 05/13/2017    History of Present Illness Pt admit after 2 falls with left SDH.  Also with right LE DVT with plan for possible IVC filter.  Pt also found to have sacral fx - nonoperative per MD.  Pt possibly with seizures on 05/08/17 with w/u in regards to this.  PMH:  COPD, PTSD    PT Comments    CIR denied by insurance. D/C recommendation changed to SNF. Pt very motivated to participate in therapy. He ambulated 300 feet with RW min guard assist. He continues to demo decreased safety awareness and balance.     Follow Up Recommendations  SNF     Equipment Recommendations  Other (comment)(TBD at next venue)    Recommendations for Other Services       Precautions / Restrictions Precautions Precautions: Fall;Other (comment) Precaution Comments: seizures    Mobility  Bed Mobility Overal bed mobility: Modified Independent       Supine to sit: Modified independent (Device/Increase time) Sit to supine: Modified independent (Device/Increase time)   General bed mobility comments: +rail  Transfers   Equipment used: Rolling walker (2 wheeled)   Sit to Stand: Min guard Stand pivot transfers: Min guard       General transfer comment: min guard for safety  Ambulation/Gait Ambulation/Gait assistance: Min guard Ambulation Distance (Feet): 300 Feet Assistive device: Rolling walker (2 wheeled) Gait Pattern/deviations: Step-through pattern;Decreased stride length;Drifts right/left Gait velocity: decreased Gait velocity interpretation: Below normal speed for age/gender General Gait Details: verbal cues for RW management   Stairs            Wheelchair Mobility    Modified Rankin (Stroke Patients Only)       Balance   Sitting-balance support: Feet supported;No upper extremity supported Sitting balance-Leahy Scale: Good     Standing balance support: During  functional activity;Bilateral upper extremity supported Standing balance-Leahy Scale: Fair                              Cognition Arousal/Alertness: Awake/alert Behavior During Therapy: Impulsive Overall Cognitive Status: Impaired/Different from baseline                     Current Attention Level: Selective Memory: Decreased short-term memory;Decreased recall of precautions Following Commands: Follows one step commands consistently;Follows multi-step commands inconsistently;Follows multi-step commands with increased time Safety/Judgement: Decreased awareness of safety   Problem Solving: Difficulty sequencing;Requires verbal cues        Exercises      General Comments        Pertinent Vitals/Pain Pain Assessment: No/denies pain    Home Living                      Prior Function            PT Goals (current goals can now be found in the care plan section) Acute Rehab PT Goals Patient Stated Goal: rehab then home PT Goal Formulation: With patient Time For Goal Achievement: 05/23/17 Potential to Achieve Goals: Good Progress towards PT goals: Progressing toward goals    Frequency    Min 4X/week      PT Plan Discharge plan needs to be updated    Co-evaluation              AM-PAC PT "6 Clicks" Daily Activity  Outcome Measure  Difficulty turning over in bed (including adjusting bedclothes, sheets and blankets)?: None Difficulty moving from lying on back to sitting on the side of the bed? : A Little Difficulty sitting down on and standing up from a chair with arms (e.g., wheelchair, bedside commode, etc,.)?: A Little Help needed moving to and from a bed to chair (including a wheelchair)?: A Little Help needed walking in hospital room?: A Little Help needed climbing 3-5 steps with a railing? : A Little 6 Click Score: 19    End of Session Equipment Utilized During Treatment: Gait belt Activity Tolerance: Patient tolerated  treatment well Patient left: in chair;with call bell/phone within reach Nurse Communication: Mobility status PT Visit Diagnosis: Muscle weakness (generalized) (M62.81);Unsteadiness on feet (R26.81)     Time: 3159-4585 PT Time Calculation (min) (ACUTE ONLY): 10 min  Charges:  $Gait Training: 8-22 mins                    G Codes:       Lorrin Goodell, PT  Office # [phone] Pager [phone]    Lorriane Shire 05/13/2017, 11:20 AM

## 2017-05-13 NOTE — Discharge Summary (Signed)
[facility]  Physician Discharge Summary   Patient ID: Carlos Vaughn MRN: [id number] DOB/AGE: June 17, 1945 72 y.o.  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 Please check CBC and basic metabolic panel in 1 week 2. Patient will need to be seen by Dr. 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  Patient was found to have L subdural hemorrhage + mild mass effect.  Neurosurgery was consulted and the patient was transferred over to this facility.  He developed seizures.  He was given antiepileptic treatment.  He did not require any surgical intervention.  Consultations: Neurosurgery.  Neurology.  Procedures: EEG IMPRESSION: This is an abnormal EEG demonstrating a mild diffuse slowing of electrocerebral activity.  This can be seen in a wide variety of encephalopathic state including those of a toxic, metabolic, or degenerative nature.  There were no focal, hemispheric, or lateralizing features.  No epileptiform activity was recorded.   This does not exclude the diagnosis of a seizure disorder and clinical correlation is required.   HOSPITAL COURSE:   Subdural hemorrhage This was in the setting of anticoagulation being administered for recently diagnosed DVT.  Patient was seen by neurosurgery.  Did not require any surgical intervention.  However patient did have waxing and waning right upper extremity weakness.  There was concern for seizure activity.  Patient seen by neurology.  Started on Keppra and Dilantin.  Symptoms have improved.  He was changed over to oral antiepileptics.  Neurological status appears to be stable.  Unfortunately insurance did not approve him for inpatient rehabilitation.    Patient will now go to skilled nursing facility for short-term rehab.  He will need outpatient follow-up with neurosurgery.    Generalized weakness Patient had falls at home.  Seen by physical and occupational therapy.  Needs rehabilitation.   Acute right lower extremity DVT Initially diagnosed on November 8.  Has been on Lovenox.  Repeat study on 1/20 also showed DVT.  Since anticoagulation is not possible due to subdural hematoma patient underwent IVC filter placement on 1/20.  Will need removal when safe for anticoagulation again.  This will need to be pursued by his outpatient providers.  Myelodysplasia C plus chronic thrombocytopenia/anemia Continue Jakafi as per [facility] hematologist.  Outpatient follow-up.  Bipolar disorder/situational anxiety Alprazolam as needed.    History of COPD  Stable.  Continue to monitor.    Sacral fracture Seems to be stable.  Does not have any significant pain.  Has been ambulating using a walker.  Complains of right arm  swelling mainly localized to the hand and forearm.  Mild swelling noted.  He has good radial pulses.  No erythema noted.  Nontender.  Patient reassured.  Asked to keep his right arm elevated.  Overall stable.  Looking forward to going to short-term rehab.  Discussed with  patient and family.  Okay for discharge today.    PERTINENT LABS:  The results of significant diagnostics from this hospitalization (including imaging, microbiology, ancillary and laboratory) are listed below for reference.    Microbiology: Recent Results (from the past 240 hour(s))  MRSA PCR Screening     Status: None   Collection Time: 05/07/17  9:32 PM  Result Value Ref Range Status   MRSA by PCR NEGATIVE NEGATIVE Final    Comment:        The GeneXpert MRSA Assay (FDA approved for NASAL specimens only), is one component of a comprehensive MRSA colonization surveillance program. It is not intended to diagnose MRSA infection nor to guide or monitor treatment for MRSA infections.      Labs: Basic Metabolic Panel: Recent Labs  Lab 05/07/17 0941 05/07/17 1000 05/10/17 0634  NA 136 139 137  K 4.0 4.0 3.5  CL 107 104 106  CO2 21*  --  22  GLUCOSE 148* 144* 109*  BUN 12 11 8   CREATININE 0.89 0.80 0.87  CALCIUM 8.8*  --  8.4*   Liver Function Tests: Recent Labs  Lab 05/07/17 0941  AST 28  ALT 37  ALKPHOS 70  BILITOT 0.9  PROT 6.7  ALBUMIN 4.1   CBC: Recent Labs  Lab 05/07/17 0941 05/07/17 1000 05/10/17 0634  WBC 13.0*  --  14.7*  NEUTROABS 11.9*  --   --   HGB 11.4* 11.9* 10.3*  HCT 36.5* 35.0* 32.9*  MCV 86.9  --  87.0  PLT 76*  --  84*   Cardiac Enzymes: Recent Labs  Lab 05/07/17 0941  CKTOTAL 36*   CBG: Recent Labs  Lab 05/07/17 0916  GLUCAP 131*     IMAGING STUDIES Dg Sacrum/coccyx  Result Date: 05/08/2017 CLINICAL DATA:  72 year old male status post fall yesterday with tail bone pain. EXAM: SACRUM AND COCCYX - 2+ VIEW COMPARISON:  No prior pelvis radiographs. FINDINGS: On the lateral view the lower lumbar and upper sacral segments appear intact, but there is a fracture deformity through the S5 sacral segment with mild anterior angulation and displacement. The coccygeal segments appear to remain intact. SI joints appear intact.  Remaining visible pelvis appears intact. IMPRESSION: Mildly angulated and displaced lower sacral fracture at S5. The upper sacrum and both SI joints appear intact. Electronically Signed   By: Genevie Ann M.D.   On: 05/08/2017 09:47   Ct Head Wo Contrast  Result Date: 05/08/2017 CLINICAL DATA:  Subdural hemorrhage follow-up EXAM: CT HEAD WITHOUT CONTRAST TECHNIQUE: Contiguous axial images were obtained from the base of the skull through the vertex without intravenous contrast. COMPARISON:  CT head 05/08/2017 FINDINGS: Brain: Left-sided interhemispheric subdural hematoma unchanged measuring up to 13 mm in thickness. Large left tentorial subdural hematoma also unchanged measuring up to 16 mm in thickness. No new area of hemorrhage. No subarachnoid hemorrhage. Generalized atrophy and chronic microvascular ischemia. No acute infarct. No midline shift. Negative for mass lesion. Vascular: Negative for hyperdense vessel Skull: Negative Sinuses/Orbits: Mild mucosal edema paranasal sinuses. Negative orbit Other: None IMPRESSION: Subdural hematoma along the falx and tentorium on the left unchanged from earlier today. No midline shift. No new hemorrhage. Electronically Signed  By: Franchot Gallo M.D.   On: 05/08/2017 18:35   Ct Head Wo Contrast  Result Date: 05/08/2017 CLINICAL DATA:  72 year old male with parasagittal subdural hematoma diagnosed on CT yesterday. Presented with progressive weakness. Personal history of myelodysplastic syndrome, was on Lovenox which was stopped. EXAM: CT HEAD WITHOUT CONTRAST TECHNIQUE: Contiguous axial images were obtained from the base of the skull through the vertex without intravenous contrast. COMPARISON:  Head CT 05/07/2017. FINDINGS: Brain: Left parafalcine and tentorial subdural blood redemonstrated. The hemorrhage remains predominantly hyperintense. Lobulated left parafalcine component up to 15 millimeters in thickness above the body of the lateral ventricles is stable. Parafalcine  blood thickness generally 7-8 millimeters or less elsewhere. The oval lobulated component along the left posterior tentorium is stable to 1 millimeter larger, up to 17 millimeters in thickness now. As before no peripheral subdural blood is identified. Mild mass effect on the left hemisphere and left lateral ventricle with no midline shift. No ventriculomegaly. Basilar cisterns remain normal. No new intracranial hemorrhage. Stable gray-white matter differentiation throughout the brain. No cortically based acute infarct identified. Vascular: Calcified atherosclerosis at the skull base. No suspicious intracranial vascular hyperdensity. Skull: Bone mineralization within normal limits. No skull fracture or No acute osseous abnormality identified. Sinuses/Orbits: Visualized paranasal sinuses and mastoids are stable and well pneumatized. Other: Visualized orbits and scalp soft tissues are within normal limits. IMPRESSION: 1. Stable to minimally increased volume of hyperdense left parafalcine and left tentorial subdural hematoma since 0933 hours yesterday. 2. Stable mild regional mass effect, with no midline shift or ventriculomegaly. 3. No new intracranial abnormality. Electronically Signed   By: Genevie Ann M.D.   On: 05/08/2017 07:14   Ct Head Wo Contrast  Addendum Date: 05/07/2017   ADDENDUM REPORT: 05/07/2017 10:40 ADDENDUM: Critical Value/emergent results were called by telephone at the time of interpretation on 05/07/2017 At 1011 hours to Dr. Quintella Reichert , who verbally acknowledged these results. Electronically Signed   By: Genevie Ann M.D.   On: 05/07/2017 10:40   Result Date: 05/07/2017 CLINICAL DATA:  72 year old male with weakness beginning 1 week ago, progressive symptoms for 2 days. EXAM: CT HEAD WITHOUT CONTRAST TECHNIQUE: Contiguous axial images were obtained from the base of the skull through the vertex without intravenous contrast. COMPARISON:  None. FINDINGS: Brain: Lobulated and mixed density but mostly  hyperdense subdural hemorrhage left paracentral along the falx and left tentorium. A thin strip of hemorrhage mostly 4-5 millimeters in areas, but with focal lobulated components up to the 14 or 16 millimeters in thickness (series 2, image 22 and coronal image 52). No superimposed peripheral extra-axial hemorrhage is identified. No definite posterior fossa blood. No subarachnoid, parenchymal, or intraventricular hemorrhage. Mild mass effect on the left hemisphere including mild effacement of the left lateral ventricle occipital horn and atrium. No midline shift or ventriculomegaly. Patent basilar cisterns. No cortically based acute infarct identified. Gray-white matter differentiation appears within normal limits for age throughout the brain. Vascular: Calcified atherosclerosis at the skull base. Skull: No skull fracture identified. Visible osseous structures appear normal. Sinuses/Orbits: Clear. Other: Visualized orbits and scalp soft tissues are within normal limits. IMPRESSION: 1. Smooth and lobulated acute appearing Subdural Hemorrhage along the medial left hemisphere tracking along the left falx and left tentorium. Hemorrhage lobulation up to 16 mm in thickness. 2. Etiology of #1 uncertain, with no superimposed skull fracture or other acute intracranial abnormality identified. Query coagulopathy. 3. Mild intracranial mass effect at this time. No midline shift and patent basilar cisterns.  Electronically Signed: By: Genevie Ann M.D. On: 05/07/2017 10:11   Ir Ivc Filter Plmt / S&i /img Guid/mod Sed  Result Date: 05/08/2017 INDICATION: 72 year old with intracranial hemorrhage. Right lower extremity DVT. Patient is not an anticoagulation candidate at this time. Patient needs IVC filter for pulmonary embolism prevention. EXAM: IVC FILTER PLACEMENT; IVC VENOGRAM; ULTRASOUND FOR VASCULAR ACCESS Physician: Stephan Minister. Anselm Pancoast, MD MEDICATIONS: None. ANESTHESIA/SEDATION: Fentanyl 25 mcg IV; Versed 1.0 mg IV Moderate Sedation  Time:  14 minutes The patient was continuously monitored during the procedure by the interventional radiology nurse under my direct supervision. CONTRAST:  50 mL Isovue FLUOROSCOPY TIME:  Fluoroscopy Time: 1 minutes 42 seconds (39 mGy). COMPLICATIONS: None immediate. PROCEDURE: Informed consent was obtained for an IVC venogram and filter placement. Ultrasound demonstrated a patent right common femoral vein. Ultrasound images were obtained for documentation. The right groin was prepped and draped in a sterile fashion. Maximal barrier sterile technique was utilized including caps, mask, sterile gowns, sterile gloves, sterile drape, hand hygiene and skin antiseptic. The skin was anesthetized with 1% lidocaine. A 21 gauge needle was directed into the vein with ultrasound guidance and a micropuncture dilator set was placed. A wire was advanced into the IVC. The filter sheath was advanced over the wire into the IVC. An IVC venogram was performed. Fluoroscopic images were obtained for documentation. A Bard Denali filter was deployed below the lowest renal vein. A follow-up venogram was performed and the vascular sheath was removed with manual compression. FINDINGS: IVC was patent. Bilateral renal veins were identified. Infrarenal inferior vena cava measured 24 mm. The filter was deployed below the lowest renal vein. Follow-up venogram confirmed placement within the IVC and below the renal veins. IMPRESSION: Successful placement of a retrievable IVC filter. PLAN: This IVC filter is potentially retrievable. The patient will be assessed for filter retrieval by Interventional Radiology in approximately 8-12 weeks. Further recommendations regarding filter retrieval, continued surveillance or declaration of device permanence, will be made at that time. Electronically Signed   By: [name] M.D.   On: 05/08/2017 14:31   Dg Chest Port 1 View  Result Date: 05/07/2017 CLINICAL DATA:  Pain following fall EXAM: PORTABLE CHEST 1  VIEW COMPARISON:  October 10, 2016 and Aug 21, 2016 FINDINGS: Previous airspace consolidation the right has cleared. There is mild scarring in the right mid lung. Lungs elsewhere clear. Heart is upper normal in size with pulmonary vascularity normal. There is aortic atherosclerosis. No adenopathy. No bone lesions. No evident pneumothorax. IMPRESSION: Mild scarring right mid lung. No edema or consolidation. There is aortic atherosclerosis. Aortic Atherosclerosis (ICD10-I70.0). Electronically Signed   By: Lowella Grip III M.D.   On: 05/07/2017 10:11    DISCHARGE EXAMINATION: Vitals:   05/12/17 1821 05/12/17 2157 05/13/17 0145 05/13/17 0525  BP: (!) 137/55 (!) 149/73 131/68 135/71  Pulse: 71 85 78 69  Resp: 18 18 18 18   Temp: 97.8 F (36.6 C) 99.4 F (37.4 C) 99.6 F (37.6 C) 98.2 F (36.8 C)  TempSrc: Oral Oral Oral Oral  SpO2: 98% 99% 97% 98%  Weight:      Height:       General appearance: alert, cooperative, appears stated age and no distress Resp: clear to auscultation bilaterally Cardio: regular rate and rhythm, S1, S2 normal, no murmur, click, rub or gallop GI: soft, non-tender; bowel sounds normal; no masses,  no organomegaly Extremities: extremities normal, atraumatic, no cyanosis or edema No obvious focal neurological deficits.  DISPOSITION: Skilled nursing facility  Discharge Instructions    Call MD for:  difficulty breathing, headache or visual disturbances   Complete by:  As directed    Call MD for:  extreme fatigue   Complete by:  As directed    Call MD for:  persistant dizziness or light-headedness   Complete by:  As directed    Call MD for:  persistant nausea and vomiting   Complete by:  As directed    Call MD for:  severe uncontrolled pain   Complete by:  As directed    Call MD for:  temperature >100.4   Complete by:  As directed    Diet - low sodium heart healthy   Complete by:  As directed    Discharge instructions   Complete by:  As directed    Please  review instructions on the discharge summary.  You were cared for by a hospitalist during your hospital stay. If you have any questions about your discharge medications or the care you received while you were in the hospital after you are discharged, you can call the unit and asked to speak with the hospitalist on call if the hospitalist that took care of you is not available. Once you are discharged, your primary care physician will handle any further medical issues. Please note that NO REFILLS for any discharge medications will be authorized once you are discharged, as it is imperative that you return to your primary care physician (or establish a relationship with a primary care physician if you do not have one) for your aftercare needs so that they can reassess your need for medications and monitor your lab values. If you do not have a primary care physician, you can call [phone] for a physician referral.   Increase activity slowly   Complete by:  As directed    Increase activity slowly   Complete by:  As directed         Allergies as of 05/13/2017      Reactions   Oxycodone Itching      Medication List    STOP taking these medications   doxycycline 100 MG capsule Commonly known as:  VIBRAMYCIN   enoxaparin 80 MG/0.8ML injection Commonly known as:  LOVENOX   ibuprofen 200 MG tablet Commonly known as:  ADVIL,MOTRIN   ipratropium 17 MCG/ACT inhaler Commonly known as:  ATROVENT HFA   QUEtiapine 100 MG tablet Commonly known as:  SEROQUEL     TAKE these medications   albuterol 108 (90 Base) MCG/ACT inhaler Commonly known as:  PROVENTIL HFA;VENTOLIN HFA Inhale 1-2 puffs into the lungs every 6 (six) hours as needed for wheezing.   ALPRAZolam 0.25 MG tablet Commonly known as:  XANAX Take 1 tablet (0.25 mg total) by mouth 3 (three) times daily as needed for anxiety.   COMBIVENT RESPIMAT 20-100 MCG/ACT Aers respimat Generic drug:  Ipratropium-Albuterol Inhale 1 puff into the  lungs 4 (four) times daily.   diphenhydrAMINE 25 mg capsule Commonly known as:  BENADRYL Take 1 capsule (25 mg total) by mouth every 8 (eight) hours as needed for itching.   HYDROcodone-acetaminophen 5-325 MG tablet Commonly known as:  NORCO/VICODIN Take 1 tablet by mouth every 6 (six) hours as needed for moderate pain.   HYDROcodone-homatropine 5-1.5 MG/5ML syrup Commonly known as:  HYCODAN Take 5 mLs by mouth every 6 (six) hours as needed for cough.   levETIRAcetam 1000 MG tablet Commonly known as:  KEPPRA Take 1 tablet (1,000 mg total) by mouth 2 (two) times  daily.   multivitamin with minerals Tabs tablet Take 1 tablet by mouth daily.   phenytoin 100 MG ER capsule Commonly known as:  DILANTIN Take 1 capsule (100 mg total) by mouth 3 (three) times daily.   prazosin 2 MG capsule Commonly known as:  MINIPRESS Take 1 mg at bedtime by mouth.   ruxolitinib phosphate 5 MG tablet Commonly known as:  JAKAFI Take 5 mg by mouth 2 (two) times daily.   simvastatin 80 MG tablet Commonly known as:  ZOCOR Take 40 mg by mouth at bedtime.   traZODone 50 MG tablet Commonly known as:  DESYREL Take 1 tablet (50 mg total) by mouth at bedtime as needed for sleep.        Follow-up Information    Newman Pies, MD. Schedule an appointment as soon as possible for a visit in 3 week(s).   Specialty:  Neurosurgery Contact information: [address] [place] [address] [phone]           TOTAL DISCHARGE TIME: 35 minutes  Bonnielee Haff  [facility] Pager [phone]  05/13/2017, 10:50 AM

## 2017-06-14 ENCOUNTER — Other Ambulatory Visit: Payer: Self-pay | Admitting: Neurosurgery

## 2017-06-14 DIAGNOSIS — S065X9A Traumatic subdural hemorrhage with loss of consciousness of unspecified duration, initial encounter: Secondary | ICD-10-CM

## 2017-06-14 DIAGNOSIS — S065XAA Traumatic subdural hemorrhage with loss of consciousness status unknown, initial encounter: Secondary | ICD-10-CM

## 2017-06-17 ENCOUNTER — Encounter (HOSPITAL_COMMUNITY): Payer: Self-pay

## 2017-06-17 ENCOUNTER — Other Ambulatory Visit: Payer: Self-pay

## 2017-06-17 ENCOUNTER — Emergency Department (HOSPITAL_COMMUNITY): Payer: Non-veteran care

## 2017-06-17 ENCOUNTER — Emergency Department (HOSPITAL_COMMUNITY)
Admission: EM | Admit: 2017-06-17 | Discharge: 2017-06-17 | Disposition: A | Discharge status: Home / Self Care | Payer: Non-veteran care | Attending: Emergency Medicine | Admitting: Emergency Medicine

## 2017-06-17 DIAGNOSIS — Z87891 Personal history of nicotine dependence: Secondary | ICD-10-CM | POA: Insufficient documentation

## 2017-06-17 DIAGNOSIS — J449 Chronic obstructive pulmonary disease, unspecified: Secondary | ICD-10-CM | POA: Diagnosis not present

## 2017-06-17 DIAGNOSIS — Z79899 Other long term (current) drug therapy: Secondary | ICD-10-CM | POA: Diagnosis not present

## 2017-06-17 DIAGNOSIS — R600 Localized edema: Secondary | ICD-10-CM | POA: Insufficient documentation

## 2017-06-17 DIAGNOSIS — Z86718 Personal history of other venous thrombosis and embolism: Secondary | ICD-10-CM | POA: Diagnosis not present

## 2017-06-17 DIAGNOSIS — R0602 Shortness of breath: Secondary | ICD-10-CM | POA: Insufficient documentation

## 2017-06-17 HISTORY — DX: Acute embolism and thrombosis of unspecified deep veins of unspecified lower extremity: I82.409

## 2017-06-17 LAB — CBC WITH DIFFERENTIAL/PLATELET
Band Neutrophils: 6 %
Basophils Absolute: 0.1 10*3/uL (ref 0.0–0.1)
Basophils Relative: 1 %
Eosinophils Absolute: 0.3 10*3/uL (ref 0.0–0.7)
Eosinophils Relative: 2 %
HCT: 40.6 % (ref 39.0–52.0)
Hemoglobin: 12.5 g/dL — ABNORMAL LOW (ref 13.0–17.0)
Lymphocytes Relative: 21 %
Lymphs Abs: 2.9 10*3/uL (ref 0.7–4.0)
MCH: 28.4 pg (ref 26.0–34.0)
MCHC: 30.8 g/dL (ref 30.0–36.0)
MCV: 92.3 fL (ref 78.0–100.0)
Metamyelocytes Relative: 6 %
Monocytes Absolute: 0.3 10*3/uL (ref 0.1–1.0)
Monocytes Relative: 2 %
Myelocytes: 4 %
Neutro Abs: 10.4 10*3/uL — ABNORMAL HIGH (ref 1.7–7.7)
Neutrophils Relative %: 58 %
Platelets: 112 10*3/uL — ABNORMAL LOW (ref 150–400)
RBC: 4.4 MIL/uL (ref 4.22–5.81)
RDW: 21 % — ABNORMAL HIGH (ref 11.5–15.5)
WBC: 14 10*3/uL — ABNORMAL HIGH (ref 4.0–10.5)
nRBC: 4 /100 WBC — ABNORMAL HIGH

## 2017-06-17 LAB — BASIC METABOLIC PANEL
Anion gap: 8 (ref 5–15)
BUN: 19 mg/dL (ref 6–20)
CO2: 27 mmol/L (ref 22–32)
Calcium: 9.2 mg/dL (ref 8.9–10.3)
Chloride: 102 mmol/L (ref 101–111)
Creatinine, Ser: 0.99 mg/dL (ref 0.61–1.24)
GFR calc Af Amer: >60 mL/min (ref >60)
GFR calc non Af Amer: >60 mL/min (ref >60)
Glucose, Bld: 101 mg/dL — ABNORMAL HIGH (ref 65–99)
Potassium: 4.9 mmol/L (ref 3.5–5.1)
Sodium: 137 mmol/L (ref 135–145)

## 2017-06-17 LAB — BRAIN NATRIURETIC PEPTIDE: B Natriuretic Peptide: 32.1 pg/mL (ref 0.0–100.0)

## 2017-06-17 LAB — TROPONIN I: Troponin I: <0.03 ng/mL (ref <0.03)

## 2017-06-17 MED ORDER — SODIUM CHLORIDE 0.9 % IJ SOLN
INTRAMUSCULAR | Status: AC
  Administered 2017-06-17: 18:00:00
  Filled 2017-06-17: qty 50

## 2017-06-17 MED ORDER — IOPAMIDOL (ISOVUE-370) INJECTION 76%
100.0000 mL | Freq: Once | INTRAVENOUS | Status: AC | PRN
  Administered 2017-06-17: 100 mL via INTRAVENOUS

## 2017-06-17 MED ORDER — PHENYTOIN SODIUM EXTENDED 100 MG PO CAPS
100.0000 mg | ORAL_CAPSULE | Freq: Three times a day (TID) | ORAL | Status: DC
  Administered 2017-06-17: 100 mg via ORAL
  Filled 2017-06-17: qty 1

## 2017-06-17 MED ORDER — IOPAMIDOL (ISOVUE-370) INJECTION 76%
INTRAVENOUS | Status: AC
  Administered 2017-06-17: 18:00:00
  Filled 2017-06-17: qty 100

## 2017-06-17 NOTE — ED Notes (Signed)
AVS explained in detail. Gave verbal consent for discharge. Knows to follow up with PCP, CT appointment on March 11th and Neurologic appt on April 4th. Ambulatory with steady gait. A&Ox4. Neurologically intact. MD [place] at bedside to give patient scan results. No other c/c. Reminded he was given his phenytoin at 1629.

## 2017-06-17 NOTE — ED Notes (Signed)
Patient transported to CT 

## 2017-06-17 NOTE — ED Notes (Signed)
MD at bedside. 

## 2017-06-17 NOTE — ED Provider Notes (Signed)
Signed out pending CT angio of the chest.  No evidence of PE or pneumonia.  Patient vital signs remained stable.  Lungs clear.  Ambulating without dyspnea.  Will follow up with his primary physician.  Return precautions given.   Julianne Rice, MD 06/17/17 [phone]

## 2017-06-17 NOTE — ED Provider Notes (Signed)
[facility] DEPT Provider Note   CSN: [id number] Arrival date & time: 06/17/17  1135     History   Chief Complaint Chief Complaint  Patient presents with  . Shortness of Breath    HPI Carlos Vaughn is a 72 y.o. male.  HPI   72 year old male with dyspnea.  Intermittent for the past week or so.  Notices with exertion.  No symptoms at rest.  Denies any pain.  No cough.  No fevers or chills.  He does have a past history of DVT, but not currently anticoagulated.  A inferior vena cava filter was placed after he had a subdural hematoma while anticoagulated.  He does have some persistent edema in his right lower extremity but denies any acute change in this.  Denies any known history of CAD.  Past Medical History:  Diagnosis Date  . COPD (chronic obstructive pulmonary disease) ([place])   . DVT (deep venous thrombosis) ([place])   . Myelofibrosis ([place])   . PTSD (post-traumatic stress disorder)     Patient Active Problem List   Diagnosis Date Noted  . Acute deep vein thrombosis (DVT) of distal vein of lower extremity (HCC)   . Acute deep vein thrombosis (DVT) of proximal vein of lower extremity (HCC)   . Subdural hematoma ([place])   . Chronic obstructive pulmonary disease ([place])   . PTSD (post-traumatic stress disorder)   . Seizures ([place])   . Leukocytosis   . Thrombocytopenia ([place])   . Anemia of chronic disease   . Subdural hemorrhage ([place]) 05/07/2017  . Anticoagulated for Rt Leg DVT 05/07/2017  . History of DVT of lower extremity 02/2017 05/07/2017    Past Surgical History:  Procedure Laterality Date  . IR IVC FILTER PLMT / S&I /IMG GUID/MOD SED  05/08/2017       Home Medications    Prior to Admission medications   Medication Sig Start Date End Date Taking? Authorizing Provider  acetaminophen (TYLENOL) 500 MG tablet Take 500 mg by mouth daily as needed for headache.   Yes [provider]  albuterol (PROVENTIL HFA;VENTOLIN HFA) 108 (90 Base)  MCG/ACT inhaler Inhale 1-2 puffs into the lungs every 6 (six) hours as needed for wheezing. 08/21/16  Yes Tanna Furry, MD  ALPRAZolam Duanne Moron) 0.25 MG tablet Take 1 tablet (0.25 mg total) by mouth 3 (three) times daily as needed for anxiety. 05/13/17  Yes Bonnielee Haff, MD  Ascorbic Acid (SUPER C COMPLEX PO) Take 1 tablet by mouth daily.   Yes [provider]  diphenhydrAMINE (BENADRYL) 25 mg capsule Take 1 capsule (25 mg total) by mouth every 8 (eight) hours as needed for itching. 05/13/17  Yes Bonnielee Haff, MD  Ipratropium-Albuterol (COMBIVENT RESPIMAT) 20-100 MCG/ACT AERS respimat Inhale 1 puff into the lungs 4 (four) times daily.   Yes [provider]  levETIRAcetam (KEPPRA) 1000 MG tablet Take 1 tablet (1,000 mg total) by mouth 2 (two) times daily. 05/13/17  Yes Bonnielee Haff, MD  Multiple Vitamin (MULTIVITAMIN WITH MINERALS) TABS tablet Take 1 tablet by mouth daily.   Yes [provider]  Multiple Vitamins-Minerals (CENTRUM SILVER PO) Take 1 tablet by mouth daily.   Yes [provider]  phenytoin (DILANTIN) 100 MG ER capsule Take 1 capsule (100 mg total) by mouth 3 (three) times daily. 05/13/17  Yes Bonnielee Haff, MD  prazosin (MINIPRESS) 2 MG capsule Take 1 mg at bedtime by mouth.    Yes [provider]  ruxolitinib phosphate (JAKAFI) 5 MG  tablet Take 10 mg by mouth 2 (two) times daily.    Yes [provider]  simvastatin (ZOCOR) 80 MG tablet Take 40 mg by mouth at bedtime.   Yes [provider]  traZODone (DESYREL) 50 MG tablet Take 1 tablet (50 mg total) by mouth at bedtime as needed for sleep. 05/13/17  Yes Bonnielee Haff, MD  HYDROcodone-acetaminophen (NORCO/VICODIN) 5-325 MG tablet Take 1 tablet by mouth every 6 (six) hours as needed for moderate pain. Patient not taking: Reported on 06/17/2017 05/13/17   Bonnielee Haff, MD  HYDROcodone-homatropine [facility]) 5-1.5 MG/5ML syrup Take 5 mLs by mouth every 6 (six) hours as needed  for cough. Patient not taking: Reported on 06/17/2017 10/10/16   Duffy Bruce, MD    Family History Family History  Problem Relation Age of Onset  . Cancer Mother   . Pneumonia Mother   . Cancer Father     Social History Social History   Tobacco Use  . Smoking status: Former Research scientist (life sciences)  . Smokeless tobacco: Never Used  Substance Use Topics  . Alcohol use: No  . Drug use: No     Allergies   Oxycodone   Review of Systems Review of Systems  All systems reviewed and negative, other than as noted in HPI.  Physical Exam Updated Vital Signs BP 119/70   Pulse 67   Resp 13   Ht 5\' 8"  (1.727 m)   Wt 69.9 kg (154 lb)   SpO2 100%   BMI 23.42 kg/m   Physical Exam  Constitutional: He appears well-developed and well-nourished. No distress.  HENT:  Head: Normocephalic and atraumatic.  Eyes: Conjunctivae are normal. Right eye exhibits no discharge. Left eye exhibits no discharge.  Neck: Neck supple.  Cardiovascular: Normal rate, regular rhythm and normal heart sounds. Exam reveals no gallop and no friction rub.  No murmur heard. Pulmonary/Chest: Effort normal and breath sounds normal. No respiratory distress.  Abdominal: Soft. He exhibits no distension. There is no tenderness.  Musculoskeletal: He exhibits no edema or tenderness.  Mild edema right lower extremity.  No calf tenderness.  Neurological: He is alert.  Skin: Skin is warm and dry.  Psychiatric: He has a normal mood and affect. His behavior is normal. Thought content normal.  Nursing note and vitals reviewed.    ED Treatments / Results  Labs (all labs ordered are listed, but only abnormal results are displayed) Labs Reviewed  CBC WITH DIFFERENTIAL/PLATELET - Abnormal; Notable for the following components:      Result Value   WBC 14.0 (*)    Hemoglobin 12.5 (*)    RDW 21.0 (*)    Platelets 112 (*)    nRBC 4 (*)    Neutro Abs 10.4 (*)    All other components within normal limits  BASIC METABOLIC PANEL -  Abnormal; Notable for the following components:   Glucose, Bld 101 (*)    All other components within normal limits  TROPONIN I  BRAIN NATRIURETIC PEPTIDE    EKG  EKG Interpretation  Date/Time:  Friday June 17 2017 11:44:20 EST Ventricular Rate:  69 PR Interval:    QRS Duration: 94 QT Interval:  416 QTC Calculation: 446 R Axis:   38 Text Interpretation:  Sinus rhythm Low voltage, precordial leads Consider anterior infarct Confirmed by Virgel Manifold [id number]) on 06/17/2017 12:55:58 PM       Radiology Dg Chest 2 View  Result Date: 06/17/2017 CLINICAL DATA:  Patient experiencing dyspnea for a while and had pneumonia two  months ago. EXAM: CHEST  2 VIEW COMPARISON:  05/07/2017 FINDINGS: Mild scarring or chronic atelectasis in the right middle lobe adjacent to the minor fissure. Lungs otherwise clear. Heart, mediastinum and hila are unremarkable. No pleural effusion or pneumothorax. Mild chronic wedge-shaped deformity of a midthoracic vertebra, stable from a lateral chest radiograph dated 10/10/2016. IMPRESSION: No acute cardiopulmonary disease. Electronically Signed   By: Lajean Manes M.D.   On: 06/17/2017 14:34   Ct Angio Chest Pe W And/or Wo Contrast  Result Date: 06/17/2017 CLINICAL DATA:  Shortness of breath for 1 week, history DVT, mild fibrosis, COPD, former smoker EXAM: CT ANGIOGRAPHY CHEST WITH CONTRAST TECHNIQUE: Multidetector CT imaging of the chest was performed using the standard protocol during bolus administration of intravenous contrast. Multiplanar CT image reconstructions and MIPs were obtained to evaluate the vascular anatomy. CONTRAST:  143mL ISOVUE-370 IOPAMIDOL (ISOVUE-370) INJECTION 76% IV COMPARISON:  None FINDINGS: Cardiovascular: Atherosclerotic calcifications aorta, coronary arteries, and proximal great vessels. Aorta normal caliber without aneurysm or dissection. Pulmonary arteries well opacified and patent. No evidence of pulmonary embolism. Mediastinum/Nodes:  Esophagus normal appearance. Base of cervical region unremarkable. No thoracic adenopathy. Lungs/Pleura: Scattered respiratory motion artifacts. Subsegmental atelectasis in RIGHT middle lobe. No acute infiltrate, pleural effusion or pneumothorax. Upper Abdomen: Cranial aspect of an IVC filter noted. Marked splenic enlargement, spleen measuring 18.4 x 9.6 cm in axial dimensions image 91 and extending at least 10.7 cm length, inferior extent of spleen extending beyond the limits of CT chest exam. Musculoskeletal: Mottled increased attenuation of osseous structures diffusely. No fractures or lytic lesions. Review of the MIP images confirms the above findings. IMPRESSION: No evidence of pulmonary embolism. Marked splenic enlargement with mottled diffuse osseous sclerosis throughout visualized osseous structures consistent with patient's history of myelofibrosis. Aortic Atherosclerosis (ICD10-I70.0). Electronically Signed   By: Lavonia Dana M.D.   On: 06/17/2017 17:07    Procedures Procedures (including critical care time)  Medications Ordered in ED Medications - No data to display   Initial Impression / Assessment and Plan / ED Course  I have reviewed the triage vital signs and the nursing notes.  Pertinent labs & imaging results that were available during my care of the patient were reviewed by me and considered in my medical decision making (see chart for details).     71yM with dyspnea. Lungs clear. Denies CP. Looks well on exam. Imaging w/o PE or other explanatory pathology. Outpt FU.  It has been determined that no acute conditions requiring further emergency intervention are present at this time. The patient has been advised of the diagnosis and plan. I reviewed any labs and imaging including any potential incidental findings. We have discussed signs and symptoms that warrant return to the ED and they are listed in the discharge instructions.    Final Clinical Impressions(s) / ED Diagnoses    Final diagnoses:  SOB (shortness of breath)    ED Discharge Orders    None       Virgel Manifold, MD 06/18/17 [id number]

## 2017-06-17 NOTE — ED Triage Notes (Signed)
Patient c/o SOB x 1 week. Patient has a history of DVT. Patient denies a cough.

## 2017-06-27 ENCOUNTER — Ambulatory Visit
Admission: RE | Admit: 2017-06-27 | Discharge: 2017-06-27 | Disposition: A | Discharge status: Home / Self Care | Payer: Medicare PPO | Source: Ambulatory Visit | Attending: Neurosurgery | Admitting: Neurosurgery

## 2017-06-27 DIAGNOSIS — S065XAA Traumatic subdural hemorrhage with loss of consciousness status unknown, initial encounter: Secondary | ICD-10-CM

## 2017-06-27 DIAGNOSIS — S065X9A Traumatic subdural hemorrhage with loss of consciousness of unspecified duration, initial encounter: Secondary | ICD-10-CM

## 2017-06-28 ENCOUNTER — Other Ambulatory Visit (HOSPITAL_COMMUNITY): Payer: Self-pay | Admitting: Diagnostic Radiology

## 2017-06-28 DIAGNOSIS — Z95828 Presence of other vascular implants and grafts: Secondary | ICD-10-CM

## 2017-07-19 ENCOUNTER — Ambulatory Visit
Admission: RE | Admit: 2017-07-19 | Discharge: 2017-07-19 | Disposition: A | Discharge status: Home / Self Care | Payer: Non-veteran care | Source: Ambulatory Visit | Attending: Diagnostic Radiology | Admitting: Diagnostic Radiology

## 2017-07-19 ENCOUNTER — Encounter: Payer: Self-pay | Admitting: Radiology

## 2017-07-19 DIAGNOSIS — Z95828 Presence of other vascular implants and grafts: Secondary | ICD-10-CM

## 2017-07-19 HISTORY — PX: IR RADIOLOGIST EVAL & MGMT: IMG5224

## 2017-07-19 NOTE — Progress Notes (Signed)
Chief Complaint: Patient was seen in consultation today for  Chief Complaint  Patient presents with  . Follow-up    follow up IVC filter   at the request of Chrisy Hillebrand  Referring Physician(s): Alcus Dad  History of Present Illness: Carlos Vaughn is a 72 y.o. male with history of a right lower extremity DVT in November 2018 and history of myelofibrosis.  The patient was taking Lovenox injections for the DVT.  The patient developed a subdural hematoma in January 2019.  The patient was admitted and taken off anticoagulation.  An IVC filter was placed on 05/08/2017.  Patient presents for follow-up management of his IVC filter.  The patient has not resumed anticoagulation.  He is taking antiseizure medicine which he would like to stop due to the side effects.  He is scheduled to see a neurologist in the near future.  Patient has been wearing compression sock in the right lower extremity.  He has no complaints of swelling or pain in the right lower extremity.  He denies any chest pain or shortness of breath.  Fullness in his abdomen related to abdominal hernia.  Past Medical History:  Diagnosis Date  . COPD (chronic obstructive pulmonary disease) ([place])   . DVT (deep venous thrombosis) ([place])   . Myelofibrosis ([place])   . PTSD (post-traumatic stress disorder)     Past Surgical History:  Procedure Laterality Date  . IR IVC FILTER PLMT / S&I /IMG GUID/MOD SED  05/08/2017  . IR RADIOLOGIST EVAL & MGMT  07/19/2017    Allergies: Oxycodone  Medications: Prior to Admission medications   Medication Sig Start Date End Date Taking? Authorizing Provider  acetaminophen (TYLENOL) 500 MG tablet Take 500 mg by mouth daily as needed for headache.   Yes [provider]  albuterol (PROVENTIL HFA;VENTOLIN HFA) 108 (90 Base) MCG/ACT inhaler Inhale 1-2 puffs into the lungs every 6 (six) hours as needed for wheezing. 08/21/16  Yes Tanna Furry, MD  Ascorbic Acid (SUPER C COMPLEX PO) Take 1 tablet by  mouth daily.   Yes [provider]  Ipratropium-Albuterol (COMBIVENT RESPIMAT) 20-100 MCG/ACT AERS respimat Inhale 1 puff into the lungs 4 (four) times daily.   Yes [provider]  levETIRAcetam (KEPPRA) 1000 MG tablet Take 1 tablet (1,000 mg total) by mouth 2 (two) times daily. 05/13/17  Yes Bonnielee Haff, MD  Multiple Vitamin (MULTIVITAMIN WITH MINERALS) TABS tablet Take 1 tablet by mouth daily.   Yes [provider]  Multiple Vitamins-Minerals (CENTRUM SILVER PO) Take 1 tablet by mouth daily.   Yes [provider]  phenytoin (DILANTIN) 100 MG ER capsule Take 1 capsule (100 mg total) by mouth 3 (three) times daily. 05/13/17  Yes Bonnielee Haff, MD  prazosin (MINIPRESS) 2 MG capsule Take 1 mg at bedtime by mouth.    Yes [provider]  ruxolitinib phosphate (JAKAFI) 5 MG tablet Take 10 mg by mouth 2 (two) times daily.    Yes [provider]  simvastatin (ZOCOR) 80 MG tablet Take 40 mg by mouth at bedtime.   Yes [provider]  traZODone (DESYREL) 50 MG tablet Take 1 tablet (50 mg total) by mouth at bedtime as needed for sleep. 05/13/17  Yes Bonnielee Haff, MD  ALPRAZolam Duanne Moron) 0.25 MG tablet Take 1 tablet (0.25 mg total) by mouth 3 (three) times daily as needed for anxiety. Patient not taking: Reported on 07/19/2017 05/13/17   Bonnielee Haff, MD  diphenhydrAMINE (BENADRYL) 25 mg capsule Take 1 capsule (  25 mg total) by mouth every 8 (eight) hours as needed for itching. Patient not taking: Reported on 07/19/2017 05/13/17   Bonnielee Haff, MD  HYDROcodone-acetaminophen (NORCO/VICODIN) 5-325 MG tablet Take 1 tablet by mouth every 6 (six) hours as needed for moderate pain. Patient not taking: Reported on 06/17/2017 05/13/17   Bonnielee Haff, MD  HYDROcodone-homatropine [facility]) 5-1.5 MG/5ML syrup Take 5 mLs by mouth every 6 (six) hours as needed for cough. Patient not taking: Reported on 06/17/2017 10/10/16   Duffy Bruce, MD     Family  History  Problem Relation Age of Onset  . Cancer Mother   . Pneumonia Mother   . Cancer Father      Review of Systems  Constitutional: Positive for fatigue.  Respiratory: Negative.   Cardiovascular: Negative.   Gastrointestinal: Negative.   Genitourinary: Negative.   Neurological: Positive for seizures.    Vital Signs: BP 109/63   Pulse 68   Temp 97.7 F (36.5 C) (Oral)   Resp 15   Ht 5\' 8"  (1.727 m)   Wt 155 lb (70.3 kg)   SpO2 100%   BMI 23.57 kg/m   Physical Exam  Constitutional: No distress.  Cardiovascular: Normal rate and regular rhythm.  Murmur heard. Pulmonary/Chest: Effort normal and breath sounds normal.  Abdominal: Soft. Bowel sounds are normal. He exhibits distension. He exhibits no mass. There is no rebound. A hernia is present.  Musculoskeletal: He exhibits no edema.        Imaging: CLINICAL DATA:  72 year old with history of right lower extremity DVT that was diagnosed in November of 2018. Patient developed intracranial hemorrhage in January of 2019 while on anticoagulation and subsequently had an IVC filter placed. Patient presents for follow-up management of the IVC filter.  EXAM: BILATERAL LOWER EXTREMITY VENOUS DOPPLER ULTRASOUND  TECHNIQUE: Gray-scale sonography with graded compression, as well as color Doppler and duplex ultrasound were performed to evaluate the lower extremity deep venous systems from the level of the common femoral vein and including the common femoral, femoral, profunda femoral, popliteal and calf veins including the posterior tibial, peroneal and gastrocnemius veins when visible. The superficial great saphenous vein was also interrogated. Spectral Doppler was utilized to evaluate flow at rest and with distal augmentation maneuvers in the common femoral, femoral and popliteal veins.  COMPARISON:  Lower extremity venous duplex examination on 02/24/2017 and 05/08/2017. Reports are available on [facility].  FINDINGS: RIGHT LOWER EXTREMITY  Common Femoral Vein: No thrombus. Normal compressibility and color Doppler flow.  Saphenofemoral Junction: No evidence of thrombus. Normal compressibility and flow on color Doppler imaging.  Profunda Femoral Vein: No evidence of thrombus. Normal compressibility and flow on color Doppler imaging.  Femoral Vein: Positive for thrombus in the right femoral vein. There is thrombus throughout most of the right femoral vein with focal enlargement of the vein in the proximal/mid thigh. Minimal flow in the right femoral vein.  Popliteal Vein: Right popliteal vein is partially compressible with thrombus. Right popliteal vein thrombus appears to be occlusive.  Calf Veins: No evidence of thrombus. Normal compressibility and flow on color Doppler imaging.  Superficial Great Saphenous Vein: No evidence of thrombus. Normal compressibility.  LEFT LOWER EXTREMITY  Common Femoral Vein: No evidence of thrombus. Normal compressibility, respiratory phasicity and response to augmentation.  Saphenofemoral Junction: No evidence of thrombus. Normal compressibility and flow on color Doppler imaging.  Profunda Femoral Vein: No evidence of thrombus. Normal compressibility and flow on color Doppler imaging.  Femoral Vein: No evidence  of thrombus. Normal compressibility, respiratory phasicity and response to augmentation.  Popliteal Vein: No evidence of thrombus. Normal compressibility, respiratory phasicity and response to augmentation.  Calf Veins: No evidence of thrombus. Normal compressibility and flow on color Doppler imaging.  Superficial Great Saphenous Vein: No evidence of thrombus. Normal compressibility.  Other Findings:  None.  IMPRESSION: Positive for DVT in the right femoral vein and right popliteal vein. The distribution of disease is similar to the report from 05/08/2017 and suspect that this thrombus is mostly  chronic. Majority of the thrombus appears to be occlusive.  Negative for deep venous thrombosis in left lower extremity.   Electronically Signed   By: [name] M.D.   On: 07/19/2017 11:30   Ct Head Wo Contrast  Result Date: 06/27/2017 CLINICAL DATA:  Follow-up subdural hematoma. EXAM: CT HEAD WITHOUT CONTRAST TECHNIQUE: Contiguous axial images were obtained from the base of the skull through the vertex without intravenous contrast. COMPARISON:  05/08/2017 FINDINGS: Brain: Subdural hematoma along the falx and tentorium on the prior CT has resolved. There is no evidence of new intracranial hemorrhage, acute infarct, mass, or midline shift. Mild cerebral atrophy is within normal limits for age. Scattered cerebral white matter hypodensities are similar to the prior study and nonspecific but compatible with mild chronic small vessel ischemic disease. Vascular: Calcified atherosclerosis at the skull base. No hyperdense vessel. Skull: No fracture or focal osseous lesion. Sinuses/Orbits: Visualized paranasal sinuses are clear. Small chronic right mastoid effusion is noted. Orbits are unremarkable. Other: None. IMPRESSION: 1. Resolved subdural hematoma. 2. No evidence of acute intracranial abnormality. Electronically Signed   By: Logan Bores M.D.   On: 06/27/2017 15:06   Ir Radiologist Eval & Mgmt  Result Date: 07/19/2017 Please refer to notes tab for details about interventional procedure. (Op Note)   Labs:  CBC: Recent Labs    02/24/17 1017 05/07/17 0941 05/07/17 1000 05/10/17 0634 06/17/17 1257  WBC 17.5* 13.0*  --  14.7* 14.0*  HGB 19.7* 11.4* 11.9* 10.3* 12.5*  HCT 61.6* 36.5* 35.0* 32.9* 40.6  PLT 71* 76*  --  84* 112*    COAGS: No results for input(s): INR, APTT in the last 8760 hours.  BMP: Recent Labs    02/24/17 1017 05/07/17 0941 05/07/17 1000 05/10/17 0634 06/17/17 1257  NA 140 136 139 137 137  K 4.7 4.0 4.0 3.5 4.9  CL 105 107 104 106 102  CO2 25 21*  --  22  27  GLUCOSE 84 148* 144* 109* 101*  BUN 20 12 11 8 19   CALCIUM 9.2 8.8*  --  8.4* 9.2  CREATININE 1.18 0.89 0.80 0.87 0.99  GFRNONAA 59* >60  --  >60 >60  GFRAA >60 >60  --  >60 >60    LIVER FUNCTION TESTS: Recent Labs    05/07/17 0941  BILITOT 0.9  AST 28  ALT 37  ALKPHOS 70  PROT 6.7  ALBUMIN 4.1    TUMOR MARKERS: No results for input(s): AFPTM, CEA, CA199, CHROMGRNA in the last 8760 hours.  Assessment and Plan:  72 year old with a right lower extremity DVT and development of a subdural hematoma while on anticoagulation.  The subdural hematoma was treated conservatively and the patient was taken off the anticoagulation.  An IVC filter was placed due to the DVT.  Lower extremity venous duplex was performed today.  There is no DVT in the left lower extremity but there is residual DVT in the right femoral vein and the right popliteal  vein.  Thrombus in the right lower extremity is relatively occlusive.  Despite the persistent thrombus in the right lower extremity, the patient is essentially asymptomatic from the DVT.  He has no leg swelling or pain.  We discussed the retrievable IVC filter in depth.  I explained that he will need an IVC filter as long as he is at risk or has a DVT and he cannot be anticoagulated.  Patient understands that the filter can be permanent but there are potential risks with IVC filters including caval thrombosis, filter migration and filter fracture.  I reviewed a chest CT from March 2019 that partially imaged the filter and suggested that the filter was still well-positioned without complication.  No plans to remove the IVC filter at this time.  The patient continues to have a right lower extremity DVT and he is not a candidate for anticoagulation at this time.  I told the patient that we could reconsider filter removal in approximately 6 months, particularly if he is able to restart anticoagulation.  Otherwise, we should consider this filter permanent.  Plan  for follow-up evaluation in 6 months.  Thank you for this interesting consult.  I greatly enjoyed meeting Carlos Vaughn and look forward to participating in their care.  A copy of this report was sent to the requesting provider on this date.  Electronically Signed: Burman Riis 07/19/2017, 11:31 AM   I spent a total of    15 Minutes in face to face in clinical consultation, greater than 50% of which was counseling/coordinating care for IVC filter management.   Patient ID: Carlos Vaughn, male   DOB: Oct 10, 1945, 72 y.o.   MRN: [id number]

## 2017-07-21 ENCOUNTER — Telehealth: Payer: Self-pay | Admitting: *Deleted

## 2017-07-21 ENCOUNTER — Encounter: Payer: Self-pay | Admitting: Neurology

## 2017-07-21 ENCOUNTER — Ambulatory Visit (INDEPENDENT_AMBULATORY_CARE_PROVIDER_SITE_OTHER): Payer: Medicare PPO | Admitting: Neurology

## 2017-07-21 ENCOUNTER — Telehealth: Payer: Self-pay | Admitting: Neurology

## 2017-07-21 VITALS — BP 109/68 | HR 71 | Ht 68.0 in | Wt 152.5 lb

## 2017-07-21 DIAGNOSIS — S065X9A Traumatic subdural hemorrhage with loss of consciousness of unspecified duration, initial encounter: Secondary | ICD-10-CM

## 2017-07-21 DIAGNOSIS — I6200 Nontraumatic subdural hemorrhage, unspecified: Secondary | ICD-10-CM

## 2017-07-21 DIAGNOSIS — S065XAA Traumatic subdural hemorrhage with loss of consciousness status unknown, initial encounter: Secondary | ICD-10-CM

## 2017-07-21 DIAGNOSIS — G40209 Localization-related (focal) (partial) symptomatic epilepsy and epileptic syndromes with complex partial seizures, not intractable, without status epilepticus: Secondary | ICD-10-CM | POA: Diagnosis not present

## 2017-07-21 MED ORDER — ALPRAZOLAM 1 MG PO TABS: 1.0000 mg | ORAL_TABLET | Freq: Every evening | ORAL | 0 refills | Status: DC | PRN

## 2017-07-21 NOTE — Telephone Encounter (Signed)
Please call patient, will have MRI of the brain first, before we proceed with the decision of anticoagulation for his DVT,  Give him a followup appt sooner after MRI

## 2017-07-21 NOTE — Telephone Encounter (Signed)
Per Dr. Anselm Vaughn request I called Mr. Carlos Vaughn and s/w his wife. Dr. Anselm Vaughn s/w Dr. Stevan Vaughn from [facility] and she is going to re-start Mr. Carlos Vaughn anticoagulation. I told them we could schedule in about a month if he is doing fine with anticoagulation. If patient wants, we can schedule filter retrieval in one month or he can see Dr. Anselm Vaughn in clinic.  Per patients wife they have a lot of question they are going to ask Dr. Stevan Vaughn and will call back when they have made a decision.Cathren Harsh

## 2017-07-21 NOTE — Telephone Encounter (Signed)
[employer] Josem Kaufmann: [phone] (exp. 07/21/17 to 08/20/17) order faxed to [facility] they will reach out to the patient to schedule. Patient wants a open MRI.

## 2017-07-21 NOTE — Progress Notes (Signed)
PATIENT: SHERIDAN GETTEL DOB: 09/24/1945  Chief Complaint  Patient presents with  . Seizures    He is here with his wife, [name].  Reports having one seizure while in the hospital in January.  He was admitted at the time for a subdural hematoma.  He had been on Lovenox injections for a blood clot in his leg.  He is now taking Keppra 1000mg  twice daily.  [name] PCP    Caralyn Guile, DO     HISTORICAL  ORRIN YURKOVICH is a 72 year old male, seen in refer by his primary care [name], [name], for evaluation of seizure, he is accompanied by his wife at today's clinical visit, initial evaluation was on July 21 2017.  He has past medical history of hypertension, hyperlipidemia, PTSD, he developed right lower extremity DVT at the end of 2018, presented with right leg pain, right leg swelling,   On May 08, 2017, woke up in the middle of the night using bathroom, fell to the ground, could not get up, was taken to the hospital, CT head showed evidence of subdural hematoma along the falx and tentorium on the left  During his hospital stay, he also developed partial seizure, was uncontrollable right arm shaking for a few minutes, is mild confusion, he was loaded with antiepileptic medications, now discharged with Dilantin 100 mg 3 times a day, Keppra 500 mg 2 tablets twice a day, he complains of fatigue, sleepiness after medications, had no recurrent seizures since discharge.  Repeat CT head without contrast in March 2019 showed scattered supratentorium hypodensity lesions, possible small vessel disease.  Ultrasound in April 2019 showed DVT at right femoral vein, and right popliteal vein,  He did have inferior vena cava IV filter placement on May 08, 2017 by Dr. [name]  REVIEW OF SYSTEMS: Full 14 system review of systems performed and notable only for fatigue, hearing loss, anxiety, too much sleep, decreased energy  ALLERGIES: Allergies  Allergen Reactions  . Oxycodone Itching    HOME  MEDICATIONS: Current Outpatient Medications  Medication Sig Dispense Refill  . albuterol (PROVENTIL HFA;VENTOLIN HFA) 108 (90 Base) MCG/ACT inhaler Inhale 1-2 puffs into the lungs every 6 (six) hours as needed for wheezing. 1 Inhaler 0  . Ascorbic Acid (SUPER C COMPLEX PO) Take 1 tablet by mouth daily.    [name] HYDROcodone-homatropine (HYCODAN) 5-1.5 MG/5ML syrup Take 5 mLs by mouth every 6 (six) hours as needed for cough. 120 mL 0  . Ipratropium-Albuterol (COMBIVENT RESPIMAT) 20-100 MCG/ACT AERS respimat Inhale 1 puff into the lungs 4 (four) times daily.    [name] levETIRAcetam (KEPPRA) 1000 MG tablet Take 1 tablet (1,000 mg total) by mouth 2 (two) times daily.    . Multiple Vitamin (MULTIVITAMIN WITH MINERALS) TABS tablet Take 1 tablet by mouth daily.    . phenytoin (DILANTIN) 100 MG ER capsule Take 1 capsule (100 mg total) by mouth 3 (three) times daily.    . prazosin (MINIPRESS) 2 MG capsule Take 1 mg at bedtime by mouth.     . ruxolitinib phosphate (JAKAFI) 5 MG tablet Take 10 mg by mouth 2 (two) times daily.     . simvastatin (ZOCOR) 80 MG tablet Take 40 mg by mouth at bedtime.    . traZODone (DESYREL) 50 MG tablet Take 1 tablet (50 mg total) by mouth at bedtime as needed for sleep. 20 tablet 0   No current facility-administered medications for this visit.     PAST MEDICAL HISTORY: Past  Medical History:  Diagnosis Date  . Anxiety   . COPD (chronic obstructive pulmonary disease) ([place])   . Depression   . DVT (deep venous thrombosis) ([place])   . Hyperlipemia   . Melanoma ([place])   . Myelofibrosis ([place])   . PTSD (post-traumatic stress disorder)     PAST SURGICAL HISTORY: Past Surgical History:  Procedure Laterality Date  . HERNIA REPAIR    . IR IVC FILTER PLMT / S&I /IMG GUID/MOD SED  05/08/2017  . IR RADIOLOGIST EVAL & MGMT  07/19/2017  . MELANOMA EXCISION     removed off back    FAMILY HISTORY: Family History  Problem Relation Age of Onset  . Pneumonia Mother   . Breast cancer Mother    . Leukemia Father   . Cancer Sister        unsure of type - thinks it was breast cancer    SOCIAL HISTORY:  Social History   Socioeconomic History  . Marital status: Married    Spouse name: Not on file  . Number of children: 2  . Years of education: tech school  . Highest education level: Not on file  Occupational History  . Occupation: Retired  Scientific laboratory technician  . Financial resource strain: Not on file  . Food insecurity:    Worry: Not on file    Inability: Not on file  . Transportation needs:    Medical: Not on file    Non-medical: Not on file  Tobacco Use  . Smoking status: Former Research scientist (life sciences)  . Smokeless tobacco: Never Used  Substance and Sexual Activity  . Alcohol use: Yes    Comment: 2 beers twice weekly  . Drug use: No  . Sexual activity: Yes  Lifestyle  . Physical activity:    Days per week: Not on file    Minutes per session: Not on file  . Stress: Not on file  Relationships  . Social connections:    Talks on phone: Not on file    Gets together: Not on file    Attends religious service: Not on file    Active member of club or organization: Not on file    Attends meetings of clubs or organizations: Not on file    Relationship status: Not on file  . Intimate partner violence:    Fear of current or ex partner: Not on file    Emotionally abused: Not on file    Physically abused: Not on file    Forced sexual activity: Not on file  Other Topics Concern  . Not on file  Social History Narrative   07/21/17 - Lives at home with his wife.  He adopted his great-grandson and he lives with them (age 83).   Caffeine use:  2 cups of coffee, 2-3 cups Coke per day.   Right-handed.     PHYSICAL EXAM   Vitals:   07/21/17 0844  BP: 109/68  Pulse: 71  Weight: 152 lb 8 oz (69.2 kg)  Height: 5\' 8"  (1.727 m)    Not recorded      Body mass index is 23.19 kg/m.  PHYSICAL EXAMNIATION:  Gen: NAD, conversant, well nourised, obese, well groomed                       Cardiovascular: Regular rate rhythm, no peripheral edema, warm, nontender. Eyes: Conjunctivae clear without exudates or hemorrhage Neck: Supple, no carotid bruits. Pulmonary: Clear to auscultation bilaterally   NEUROLOGICAL EXAM:  MENTAL STATUS:  Speech:    Speech is normal; fluent and spontaneous with normal comprehension.  Cognition:     Orientation to time, place and person     Normal recent and remote memory     Normal Attention span and concentration     Normal Language, naming, repeating,spontaneous speech     Fund of knowledge   CRANIAL NERVES: CN II: Visual fields are full to confrontation. Fundoscopic exam is normal with sharp discs and no vascular changes. Pupils are round equal and briskly reactive to light. CN III, IV, VI: extraocular movement are normal. No ptosis. CN V: Facial sensation is intact to pinprick in all 3 divisions bilaterally. Corneal responses are intact.  CN VII: Face is symmetric with normal eye closure and smile. CN VIII: Hearing is normal to rubbing fingers CN IX, X: Palate elevates symmetrically. Phonation is normal. CN XI: Head turning and shoulder shrug are intact CN XII: Tongue is midline with normal movements and no atrophy.  MOTOR: There is no pronator drift of out-stretched arms. Muscle bulk and tone are normal. Muscle strength is normal.  REFLEXES: Reflexes are 2+ and symmetric at the biceps, triceps, knees, and ankles. Plantar responses are flexor.  SENSORY: Intact to light touch, pinprick, positional sensation and vibratory sensation are intact in fingers and toes.  COORDINATION: Rapid alternating movements and fine finger movements are intact. There is no dysmetria on finger-to-nose and heel-knee-shin.    GAIT/STANCE: Posture is normal. Gait is steady with normal steps, base, arm swing, and turning. Heel and toe walking are normal. Tandem gait is normal.  Romberg is absent.   DIAGNOSTIC DATA (LABS, IMAGING, TESTING) - I reviewed  patient records, labs, notes, testing and imaging myself where available.   ASSESSMENT AND PLAN  JONATHAN CORPUS is a 72 y.o. male    Subdural hematoma in January 2019 Partial seizure  EEG,  MRI of brain  Stop Dilantin,  Decrease Keppra to 500 mg twice a day   Return to clinic in 4 weeks    Marcial Pacas, M.D. Ph.D.  [facility] [address] [place], [place] [address] Ph: [phone] Fax: [phone]  CC: Caralyn Guile, DO

## 2017-07-21 NOTE — Telephone Encounter (Signed)
Spoke to patient to relay the information below.  He verbalized understanding.  Additionally, his follow up in July was moved up to 09/05/17.

## 2017-07-28 ENCOUNTER — Telehealth: Payer: Self-pay | Admitting: Neurology

## 2017-07-28 NOTE — Telephone Encounter (Signed)
Patient presented to the lobby this afternoon to give message to Dr. Krista Blue that he was not able to do the MRI at [facility] due to anxiety and being claustrophobic. Best call back [phone]

## 2017-07-28 NOTE — Telephone Encounter (Signed)
I called his home number and his wife answered the phone (she is on his DPR).  I explained the situation to her and asked if he had taken his oral alprazolam that he was provided for the scan.  She said he went to the appt without taking the medication.  They will call to reschedule and he will try again with the alprazolam.  She is aware that he should take 1-2 tablets thirty minutes prior to his appt and repeat with the third tablet before entering the scanner, if needed.  He will need a driver.  She verbalized understanding.

## 2017-08-22 ENCOUNTER — Telehealth: Payer: Self-pay | Admitting: Neurology

## 2017-08-22 ENCOUNTER — Ambulatory Visit (INDEPENDENT_AMBULATORY_CARE_PROVIDER_SITE_OTHER): Payer: Medicare PPO | Admitting: Neurology

## 2017-08-22 DIAGNOSIS — S065XAA Traumatic subdural hemorrhage with loss of consciousness status unknown, initial encounter: Secondary | ICD-10-CM

## 2017-08-22 DIAGNOSIS — G40209 Localization-related (focal) (partial) symptomatic epilepsy and epileptic syndromes with complex partial seizures, not intractable, without status epilepticus: Secondary | ICD-10-CM | POA: Diagnosis not present

## 2017-08-22 DIAGNOSIS — I6200 Nontraumatic subdural hemorrhage, unspecified: Secondary | ICD-10-CM

## 2017-08-22 DIAGNOSIS — S065X9A Traumatic subdural hemorrhage with loss of consciousness of unspecified duration, initial encounter: Secondary | ICD-10-CM

## 2017-08-22 NOTE — Telephone Encounter (Signed)
Please call patient, MRI of the brain without contrast showed evidence of no acute abnormality, some hemosiderin staining on the left tentorium leaflet, consistent with his previous history of subdural hematoma, there is no acute abnormality  MRI of the brain was done at [facility], please get MRI CD for [place] to review at his next follow-up visit

## 2017-08-22 NOTE — Procedures (Signed)
   HISTORY: 72 year old male, with history of seizure on May 08, 2017  TECHNIQUE:  16 channel EEG was performed based on standard 10-16 international system. One channel was dedicated to EKG, which has demonstrates normal sinus rhythm of 72 beats per minutes.  Upon awakening, the posterior background activity was well-developed, in alpha range,  reactive to eye opening and closure.  There was no evidence of epileptiform discharge.  Photic stimulation was performed, which induced a symmetric photic driving.  Hyperventilation was not performed.  Patient was drowsy, but no deeper sleep was achieved.  CONCLUSION: This is a  normal awake and drowsy EEG.  There is no electrodiagnostic evidence of epileptiform discharge.  Marcial Pacas, M.D. Ph.D.  [facility] [place],  [address] Phone: [phone] Fax:      [phone]

## 2017-08-22 NOTE — Telephone Encounter (Signed)
Spoke to patient - he is aware of results and will keep his pending follow up appointments.

## 2017-09-05 ENCOUNTER — Ambulatory Visit (INDEPENDENT_AMBULATORY_CARE_PROVIDER_SITE_OTHER): Payer: Medicare PPO | Admitting: Neurology

## 2017-09-05 ENCOUNTER — Encounter: Payer: Self-pay | Admitting: Neurology

## 2017-09-05 VITALS — BP 110/68 | HR 68 | Ht 68.0 in | Wt 149.0 lb

## 2017-09-05 DIAGNOSIS — G40209 Localization-related (focal) (partial) symptomatic epilepsy and epileptic syndromes with complex partial seizures, not intractable, without status epilepticus: Secondary | ICD-10-CM | POA: Diagnosis not present

## 2017-09-05 DIAGNOSIS — I6200 Nontraumatic subdural hemorrhage, unspecified: Secondary | ICD-10-CM | POA: Diagnosis not present

## 2017-09-05 NOTE — Progress Notes (Signed)
PATIENT: Carlos Vaughn DOB: 09-Aug-1945  Chief Complaint  Patient presents with  . Subdural Hemorrhage/Seizure    He is here with his wife, [name], to review his EEG and MRI results. He has continued taking Keppra 500mg  twice daily.  No further seizure activity reported.     HISTORICAL  Carlos Vaughn is a 72 year old male, seen in refer by his primary care [name], [name], for evaluation of seizure, he is accompanied by his wife at today's clinical visit, initial evaluation was on July 21 2017.  He has past medical history of hypertension, hyperlipidemia, PTSD, he developed right lower extremity DVT at the end of 2018, presented with right leg pain, right leg swelling,   On May 08, 2017, woke up in the middle of the night using bathroom, fell to the ground, could not get up, was taken to the hospital, CT head showed evidence of subdural hematoma along the falx and tentorium on the left  During his hospital stay, he also developed partial seizure, was uncontrollable right arm shaking for a few minutes, is mild confusion, he was loaded with antiepileptic medications, now discharged with Dilantin 100 mg 3 times a day, Keppra 500 mg 2 tablets twice a day, he complains of fatigue, sleepiness after medications, had no recurrent seizures since discharge.  Repeat CT head without contrast in March 2019 showed scattered supratentorium hypodensity lesions, possible small vessel disease.  Ultrasound in April 2019 showed DVT at right femoral vein, and right popliteal vein,  He did have inferior vena cava IV filter placement on May 08, 2017 by Dr. [name]  UPDATE Sep 05 2017: He is overall doing very well, there was no recurrent partial seizure, which only involved his right arm without loss of consciousness when he suffered left subdural hematoma in March 2019  Repeat MRI of the brain at [facility] showed hemosiderin staining in the left tentorium no acute hemorrhage,  significant residual hematoma around the brain, scattered small vessel disease,  He reported episode of sudden onset bilateral hands numbness, joint, even before the incident of subdural hematoma, continue happen intermittently, is prolonged driving, using his hand  REVIEW OF SYSTEMS: Full 14 system review of systems performed and notable only for fatigue, hearing loss, anxiety, too much sleep, decreased energy  ALLERGIES: Allergies  Allergen Reactions  . Oxycodone Itching    HOME MEDICATIONS: Current Outpatient Medications  Medication Sig Dispense Refill  . Ascorbic Acid (SUPER C COMPLEX PO) Take 1 tablet by mouth daily.    [name] edoxaban (SAVAYSA) 60 MG TABS tablet Take 60 mg by mouth daily.    . Ipratropium-Albuterol (COMBIVENT RESPIMAT) 20-100 MCG/ACT AERS respimat Inhale 1 puff into the lungs 4 (four) times daily.    [name] levETIRAcetam (KEPPRA) 1000 MG tablet Take 1 tablet (1,000 mg total) by mouth 2 (two) times daily.    . Multiple Vitamin (MULTIVITAMIN WITH MINERALS) TABS tablet Take 1 tablet by mouth daily.    . prazosin (MINIPRESS) 2 MG capsule Take 1 mg at bedtime by mouth.     . ruxolitinib phosphate (JAKAFI) 5 MG tablet Taking 15mg  in am and 10mg  in pm.    . simvastatin (ZOCOR) 80 MG tablet Take 40 mg by mouth at bedtime.    . traZODone (DESYREL) 150 MG tablet Take 150 mg by mouth at bedtime.     No current facility-administered medications for this visit.     PAST MEDICAL HISTORY: Past Medical History:  Diagnosis Date  .  Anxiety   . COPD (chronic obstructive pulmonary disease) ([place])   . Depression   . DVT (deep venous thrombosis) ([place])   . Hyperlipemia   . Melanoma ([place])   . Myelofibrosis ([place])   . PTSD (post-traumatic stress disorder)     PAST SURGICAL HISTORY: Past Surgical History:  Procedure Laterality Date  . HERNIA REPAIR    . IR IVC FILTER PLMT / S&I /IMG GUID/MOD SED  05/08/2017  . IR RADIOLOGIST EVAL & MGMT  07/19/2017  . MELANOMA EXCISION     removed off  back    FAMILY HISTORY: Family History  Problem Relation Age of Onset  . Pneumonia Mother   . Breast cancer Mother   . Leukemia Father   . Cancer Sister        unsure of type - thinks it was breast cancer    SOCIAL HISTORY:  Social History   Socioeconomic History  . Marital status: Married    Spouse name: Not on file  . Number of children: 2  . Years of education: tech school  . Highest education level: Not on file  Occupational History  . Occupation: Retired  Scientific laboratory technician  . Financial resource strain: Not on file  . Food insecurity:    Worry: Not on file    Inability: Not on file  . Transportation needs:    Medical: Not on file    Non-medical: Not on file  Tobacco Use  . Smoking status: Former Research scientist (life sciences)  . Smokeless tobacco: Never Used  Substance and Sexual Activity  . Alcohol use: Yes    Comment: 2 beers twice weekly  . Drug use: No  . Sexual activity: Yes  Lifestyle  . Physical activity:    Days per week: Not on file    Minutes per session: Not on file  . Stress: Not on file  Relationships  . Social connections:    Talks on phone: Not on file    Gets together: Not on file    Attends religious service: Not on file    Active member of club or organization: Not on file    Attends meetings of clubs or organizations: Not on file    Relationship status: Not on file  . Intimate partner violence:    Fear of current or ex partner: Not on file    Emotionally abused: Not on file    Physically abused: Not on file    Forced sexual activity: Not on file  Other Topics Concern  . Not on file  Social History Narrative   07/21/17 - Lives at home with his wife.  He adopted his great-grandson and he lives with them (age 40).   Caffeine use:  2 cups of coffee, 2-3 cups Coke per day.   Right-handed.     PHYSICAL EXAM   Vitals:   09/05/17 1016  BP: 110/68  Pulse: 68  Weight: 149 lb (67.6 kg)  Height: 5\' 8"  (1.727 m)    Not recorded      Body mass index is 22.66  kg/m.  PHYSICAL EXAMNIATION:  Gen: NAD, conversant, well nourised, obese, well groomed                     Cardiovascular: Regular rate rhythm, no peripheral edema, warm, nontender. Eyes: Conjunctivae clear without exudates or hemorrhage Neck: Supple, no carotid bruits. Pulmonary: Clear to auscultation bilaterally   NEUROLOGICAL EXAM:  MENTAL STATUS: Speech:    Speech is normal; fluent and spontaneous  with normal comprehension.  Cognition:     Orientation to time, place and person     Normal recent and remote memory     Normal Attention span and concentration     Normal Language, naming, repeating,spontaneous speech     Fund of knowledge   CRANIAL NERVES: CN II: Visual fields are full to confrontation. Fundoscopic exam is normal with sharp discs and no vascular changes. Pupils are round equal and briskly reactive to light. CN III, IV, VI: extraocular movement are normal. No ptosis. CN V: Facial sensation is intact to pinprick in all 3 divisions bilaterally. Corneal responses are intact.  CN VII: Face is symmetric with normal eye closure and smile. CN VIII: Hearing is normal to rubbing fingers CN IX, X: Palate elevates symmetrically. Phonation is normal. CN XI: Head turning and shoulder shrug are intact CN XII: Tongue is midline with normal movements and no atrophy.  MOTOR: There is no pronator drift of out-stretched arms. Muscle bulk and tone are normal. Muscle strength is normal.  REFLEXES: Reflexes are 2+ and symmetric at the biceps, triceps, knees, and ankles. Plantar responses are flexor.  SENSORY: Intact to light touch, pinprick, positional sensation and vibratory sensation are intact in fingers and toes.  COORDINATION: Rapid alternating movements and fine finger movements are intact. There is no dysmetria on finger-to-nose and heel-knee-shin.    GAIT/STANCE: Posture is normal. Gait is steady with normal steps, base, arm swing, and turning.    DIAGNOSTIC DATA  (LABS, IMAGING, TESTING) - I reviewed patient records, labs, notes, testing and imaging myself where available.   ASSESSMENT AND PLAN  ZEEV DEAKINS is a 72 y.o. male    Subdural hematoma in January 2019 Partial seizure  EEG was normal in May 2019  MRI of brain showed no residual bleeding  Stop Keppra   Call office for recurrent spells,  Recurrent episode of bilateral hands posturing, paresthesia,  Mostly suggestive bilateral carpal tunnel syndrome versus bilateral cervical radiculopathy  He denies significant neck pain, wants to hold off further evaluation at this point.    Marcial Pacas, M.D. Ph.D.  [facility] [address], [place],  [address] Ph: [phone] Fax: [phone]  CC: Caralyn Guile, DO

## 2017-09-28 ENCOUNTER — Telehealth: Payer: Self-pay | Admitting: *Deleted

## 2017-09-28 NOTE — Telephone Encounter (Signed)
I called Carlos Vaughn to f/u on the IVC Filter Dr. Anselm Pancoast placed 05-08-17. Patient states he had the filter removed on September 21, 2017 at the [place] in [place]./vm

## 2017-10-24 ENCOUNTER — Ambulatory Visit: Payer: Medicare PPO | Admitting: Neurology

## 2017-12-26 ENCOUNTER — Encounter (HOSPITAL_COMMUNITY): Payer: Self-pay

## 2017-12-26 ENCOUNTER — Other Ambulatory Visit: Payer: Self-pay

## 2017-12-26 ENCOUNTER — Emergency Department (HOSPITAL_COMMUNITY): Payer: Medicare PPO

## 2017-12-26 ENCOUNTER — Emergency Department (HOSPITAL_COMMUNITY)
Admission: EM | Admit: 2017-12-26 | Discharge: 2017-12-26 | Disposition: A | Discharge status: Home / Self Care | Payer: Medicare PPO | Attending: Emergency Medicine | Admitting: Emergency Medicine

## 2017-12-26 DIAGNOSIS — Z7901 Long term (current) use of anticoagulants: Secondary | ICD-10-CM | POA: Insufficient documentation

## 2017-12-26 DIAGNOSIS — R6883 Chills (without fever): Secondary | ICD-10-CM | POA: Insufficient documentation

## 2017-12-26 DIAGNOSIS — J441 Chronic obstructive pulmonary disease with (acute) exacerbation: Secondary | ICD-10-CM | POA: Diagnosis not present

## 2017-12-26 DIAGNOSIS — Z86718 Personal history of other venous thrombosis and embolism: Secondary | ICD-10-CM | POA: Insufficient documentation

## 2017-12-26 DIAGNOSIS — Z87891 Personal history of nicotine dependence: Secondary | ICD-10-CM | POA: Diagnosis not present

## 2017-12-26 DIAGNOSIS — Z79899 Other long term (current) drug therapy: Secondary | ICD-10-CM | POA: Diagnosis not present

## 2017-12-26 DIAGNOSIS — R0602 Shortness of breath: Secondary | ICD-10-CM

## 2017-12-26 LAB — COMPREHENSIVE METABOLIC PANEL
ALT: 42 U/L (ref 0–44)
AST: 39 U/L (ref 15–41)
Albumin: 4.2 g/dL (ref 3.5–5.0)
Alkaline Phosphatase: 79 U/L (ref 38–126)
Anion gap: 9 (ref 5–15)
BUN: 17 mg/dL (ref 8–23)
CHLORIDE: 107 mmol/L (ref 98–111)
CO2: 27 mmol/L (ref 22–32)
CREATININE: 1.05 mg/dL (ref 0.61–1.24)
Calcium: 9.4 mg/dL (ref 8.9–10.3)
Glucose, Bld: 105 mg/dL — ABNORMAL HIGH (ref 70–99)
Potassium: 4.5 mmol/L (ref 3.5–5.1)
Sodium: 143 mmol/L (ref 135–145)
TOTAL PROTEIN: 6.8 g/dL (ref 6.5–8.1)
Total Bilirubin: 1.2 mg/dL (ref 0.3–1.2)

## 2017-12-26 LAB — CBC WITH DIFFERENTIAL/PLATELET
BLASTS: 1 %
Basophils Absolute: 0 10*3/uL (ref 0.0–0.1)
Basophils Relative: 0 %
Eosinophils Absolute: 0.2 10*3/uL (ref 0.0–0.7)
Eosinophils Relative: 1 %
HEMATOCRIT: 35.1 % — AB (ref 39.0–52.0)
HEMOGLOBIN: 10.7 g/dL — AB (ref 13.0–17.0)
Lymphocytes Relative: 18 %
Lymphs Abs: 3.8 10*3/uL (ref 0.7–4.0)
MCH: 30.2 pg (ref 26.0–34.0)
MCHC: 30.5 g/dL (ref 30.0–36.0)
MCV: 99.2 fL (ref 78.0–100.0)
MONOS PCT: 4 %
MYELOCYTES: 13 %
Metamyelocytes Relative: 4 %
Monocytes Absolute: 0.8 10*3/uL (ref 0.1–1.0)
NEUTROS PCT: 57 %
NRBC: 15 /100{WBCs} — AB
Neutro Abs: 16 10*3/uL — ABNORMAL HIGH (ref 1.7–7.7)
PLATELETS: 109 10*3/uL — AB (ref 150–400)
PROMYELOCYTES RELATIVE: 2 %
RBC: 3.54 MIL/uL — ABNORMAL LOW (ref 4.22–5.81)
RDW: 20.1 % — ABNORMAL HIGH (ref 11.5–15.5)
WBC: 21 10*3/uL — AB (ref 4.0–10.5)

## 2017-12-26 LAB — PATHOLOGIST SMEAR REVIEW

## 2017-12-26 MED ORDER — PREDNISONE 10 MG PO TABS: 20.0000 mg | ORAL_TABLET | Freq: Every day | ORAL | 0 refills | Status: AC

## 2017-12-26 NOTE — ED Triage Notes (Signed)
SHOB and cough for roughly 2 weeks. Worried about PNA. Feels the same as when he had PNA in the past. Dry/non productive cough. +chills

## 2017-12-26 NOTE — ED Provider Notes (Signed)
[facility] DEPT Provider Note   CSN: [id number] Arrival date & time: 12/26/17  0847     History   Chief Complaint Chief Complaint  Patient presents with  . Shortness of Breath    HPI Carlos Vaughn is a 72 y.o. male.  The history is provided by the patient.  Cough  This is a new problem. The current episode started more than 2 days ago. The problem occurs constantly. The problem has been gradually improving. The cough is non-productive. There has been no fever. The fever has been present for less than 1 day. Associated symptoms include chills and shortness of breath. Pertinent negatives include no chest pain, no sweats, no weight loss, no ear congestion, no ear pain, no headaches, no rhinorrhea, no sore throat, no myalgias, no wheezing and no eye redness. He has tried nothing for the symptoms. The treatment provided no relief. He is not a smoker (former). His past medical history is significant for COPD. His past medical history does not include pneumonia, emphysema or asthma.    Past Medical History:  Diagnosis Date  . Anxiety   . COPD (chronic obstructive pulmonary disease) ([place])   . Depression   . DVT (deep venous thrombosis) ([place])   . Hyperlipemia   . Melanoma ([place])   . Myelofibrosis ([place])   . PTSD (post-traumatic stress disorder)     Patient Active Problem List   Diagnosis Date Noted  . Partial symptomatic epilepsy with complex partial seizures, not intractable, without status epilepticus ([place]) 07/21/2017  . Acute deep vein thrombosis (DVT) of distal vein of lower extremity (HCC)   . Acute deep vein thrombosis (DVT) of proximal vein of lower extremity (HCC)   . Subdural hematoma ([place])   . Chronic obstructive pulmonary disease ([place])   . PTSD (post-traumatic stress disorder)   . Seizures ([place])   . Leukocytosis   . Thrombocytopenia ([place])   . Anemia of chronic disease   . Subdural hemorrhage ([place]) 05/07/2017  . Anticoagulated for Rt Leg  DVT 05/07/2017  . History of DVT of lower extremity 02/2017 05/07/2017    Past Surgical History:  Procedure Laterality Date  . HERNIA REPAIR    . IR IVC FILTER PLMT / S&I /IMG GUID/MOD SED  05/08/2017  . IR RADIOLOGIST EVAL & MGMT  07/19/2017  . MELANOMA EXCISION     removed off back        Home Medications    Prior to Admission medications   Medication Sig Start Date End Date Taking? Authorizing Provider  Ascorbic Acid (SUPER C COMPLEX PO) Take 1 tablet by mouth daily.    [provider]  edoxaban (SAVAYSA) 60 MG TABS tablet Take 60 mg by mouth daily.    [provider]  Ipratropium-Albuterol (COMBIVENT RESPIMAT) 20-100 MCG/ACT AERS respimat Inhale 1 puff into the lungs 4 (four) times daily.    [provider]  levETIRAcetam (KEPPRA) 1000 MG tablet Take 1 tablet (1,000 mg total) by mouth 2 (two) times daily. 05/13/17   Bonnielee Haff, MD  Multiple Vitamin (MULTIVITAMIN WITH MINERALS) TABS tablet Take 1 tablet by mouth daily.    [provider]  prazosin (MINIPRESS) 2 MG capsule Take 1 mg at bedtime by mouth.     [provider]  predniSONE (DELTASONE) 10 MG tablet Take 2 tablets (20 mg total) by mouth daily for 3 days. 12/26/17 12/29/17  Trine Fread, DO  ruxolitinib phosphate (JAKAFI) 5 MG tablet Taking 15mg  in am and 10mg   in pm.    [provider]  simvastatin (ZOCOR) 80 MG tablet Take 40 mg by mouth at bedtime.    [provider]  traZODone (DESYREL) 150 MG tablet Take 150 mg by mouth at bedtime.    [provider]    Family History Family History  Problem Relation Age of Onset  . Pneumonia Mother   . Breast cancer Mother   . Leukemia Father   . Cancer Sister        unsure of type - thinks it was breast cancer    Social History Social History   Tobacco Use  . Smoking status: Former Research scientist (life sciences)  . Smokeless tobacco: Never Used  Substance Use Topics  . Alcohol use: Yes    Comment: 2 beers twice weekly    . Drug use: No     Allergies   Oxycodone   Review of Systems Review of Systems  Constitutional: Positive for chills. Negative for fever and weight loss.  HENT: Negative for ear pain, rhinorrhea and sore throat.   Eyes: Negative for pain, redness and visual disturbance.  Respiratory: Positive for cough and shortness of breath. Negative for wheezing.   Cardiovascular: Negative for chest pain and palpitations.  Gastrointestinal: Negative for abdominal pain and vomiting.  Genitourinary: Negative for dysuria and hematuria.  Musculoskeletal: Negative for arthralgias, back pain and myalgias.  Skin: Negative for color change and rash.  Neurological: Negative for seizures, syncope and headaches.  All other systems reviewed and are negative.    Physical Exam Updated Vital Signs  ED Triage Vitals  Enc Vitals Group     BP 12/26/17 0852 123/63     Pulse Rate 12/26/17 0852 85     Resp 12/26/17 0852 17     Temp 12/26/17 0852 (!) 97.5 F (36.4 C)     Temp Source 12/26/17 0852 Oral     SpO2 12/26/17 0852 99 %     Weight 12/26/17 0858 155 lb (70.3 kg)     Height 12/26/17 0858 5\' 8"  (1.727 m)     Head Circumference --      Peak Flow --      Pain Score 12/26/17 0857 3     Pain Loc --      Pain Edu? --      Excl. in [place]? --     Physical Exam  Constitutional: He appears well-developed and well-nourished.  HENT:  Head: Normocephalic and atraumatic.  Eyes: Pupils are equal, round, and reactive to light. Conjunctivae are normal.  Neck: Normal range of motion. Neck supple.  Cardiovascular: Normal rate and regular rhythm.  No murmur heard. Pulmonary/Chest: Effort normal and breath sounds normal. No respiratory distress. He has no decreased breath sounds. He has no wheezes. He has no rhonchi. He has no rales.  Abdominal: Soft. There is no tenderness.  Musculoskeletal: Normal range of motion. He exhibits no edema.  Neurological: He is alert.  Skin: Skin is warm and dry. Capillary refill  takes less than 2 seconds.  Psychiatric: He has a normal mood and affect.  Nursing note and vitals reviewed.    ED Treatments / Results  Labs (all labs ordered are listed, but only abnormal results are displayed) Labs Reviewed  COMPREHENSIVE METABOLIC PANEL - Abnormal; Notable for the following components:      Result Value   Glucose, Bld 105 (*)    All other components within normal limits  CBC WITH DIFFERENTIAL/PLATELET - Abnormal; Notable for the following components:  WBC 21.0 (*)    RBC 3.54 (*)    Hemoglobin 10.7 (*)    HCT 35.1 (*)    RDW 20.1 (*)    Platelets 109 (*)    All other components within normal limits    EKG EKG Interpretation  Date/Time:  Monday December 26 2017 09:34:30 EDT Ventricular Rate:  72 PR Interval:    QRS Duration: 98 QT Interval:  379 QTC Calculation: 415 R Axis:   29 Text Interpretation:  Sinus rhythm Low voltage, precordial leads Confirmed by Lennice Sites [phone]) on 12/26/2017 9:40:05 AM   Radiology Dg Chest 2 View  Result Date: 12/26/2017 CLINICAL DATA:  Shortness of breath and cough EXAM: CHEST - 2 VIEW COMPARISON:  Chest radiograph and chest CT June 17, 2017. FINDINGS: There is no appreciable edema or consolidation. Mild eventration of the right hemidiaphragm is noted. Heart is upper normal in size with pulmonary vascularity normal. No adenopathy. There is aortic atherosclerosis. No adenopathy. There is anterior wedging a mid thoracic vertebral body, stable. IMPRESSION: No edema or consolidation.  Stable cardiac silhouette. Electronically Signed   By: Lowella Grip III M.D.   On: 12/26/2017 09:55    Procedures Procedures (including critical care time)  Medications Ordered in ED Medications - No data to display   Initial Impression / Assessment and Plan / ED Course  I have reviewed the triage vital signs and the nursing notes.  Pertinent labs & imaging results that were available during my care of the patient were reviewed  by me and considered in my medical decision making (see chart for details).      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  Patient with unremarkable vitals.  No fever.  Patient with cough, shortness of breath for the last several days.  Has had some sputum production.  Has been using his inhalers with relief his symptoms.  Patient overall with unremarkable exam.  Fairly clear breath sounds with good air movement.  Patient mostly with dry cough upon my evaluation.  Chest x-ray showed no signs of pneumonia, pneumothorax, pleural effusion.  Patient with unremarkable BMP with no significant electrolyte abnormality or kidney injury.  Patient with CBC at baseline with leukocytosis of 21 but likely secondary to myelofibrosis and immunotherapy.  No concern for infectious process at this time.  No hypoxia, no tachycardia, already on anticoagulation no concern for PE.  EKG shows sinus rhythm with no signs of ischemic changes as well.  Doubt cardiac process.  No signs of volume overload on exam or chest x-ray. Suspect patient likely with mild COPD exacerbation and given small course of prednisone.  Recommend follow-up with primary care doctor and discharged in ED good condition.  This chart was dictated using voice recognition software.  Despite best efforts to proofread,  errors can occur which can change the documentation meaning.   Final Clinical Impressions(s) / ED Diagnoses   Final diagnoses:  COPD exacerbation ([place])  Shortness of breath    ED Discharge Orders         Ordered    predniSONE (DELTASONE) 10 MG tablet  Daily     12/26/17 1048           Ivalee Strauser, [place], DO 12/26/17 1052

## 2017-12-26 NOTE — ED Notes (Signed)
Patient given discharge teaching and verbalized understanding. Patient ambulated out of ED with a steady gait. 

## 2018-10-18 DEATH — deceased

## 2018-12-26 IMAGING — CT CT HEAD W/O CM
3 series · 14 of 47 positions shown, 16 images · non-contrast
Comparison: CT head 05/08/2017

CLINICAL DATA: Subdural hemorrhage follow-up

EXAM:
CT HEAD WITHOUT CONTRAST
TECHNIQUE: Contiguous axial images were obtained from the base of the skull
through the vertex without intravenous contrast.

[Series 3: head 5.0 h30s · axial · 0.46mm/px · z∈[-100,+25]mm · 8 of 31 slices shown, 10 images]
[im 3/31  brain]
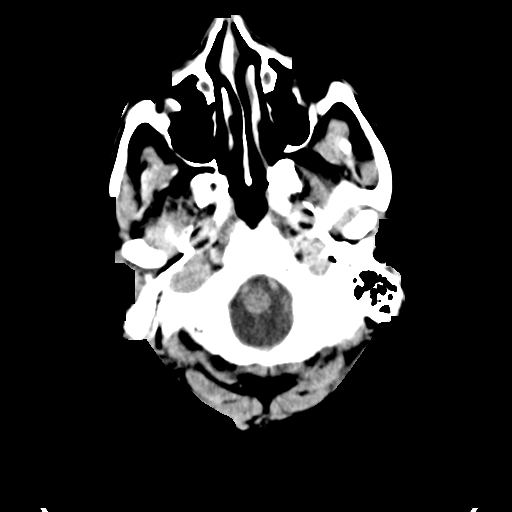
[im 3/31  bone]
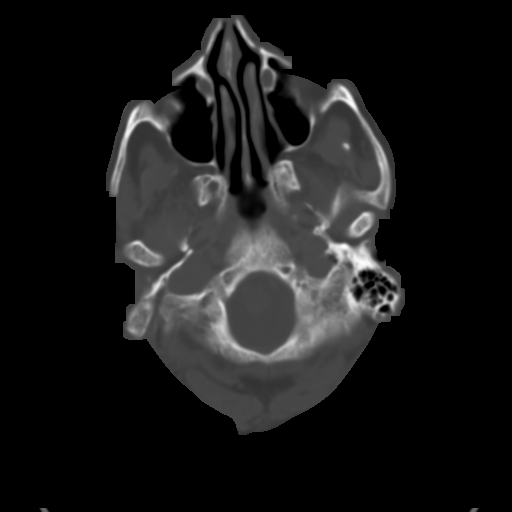
[im 7/31  brain]
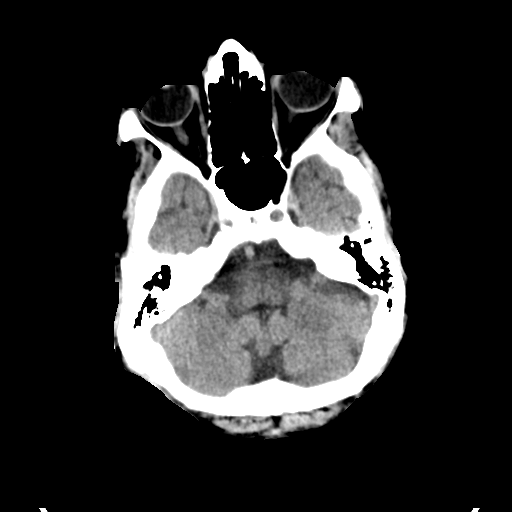
[im 10/31  brain]
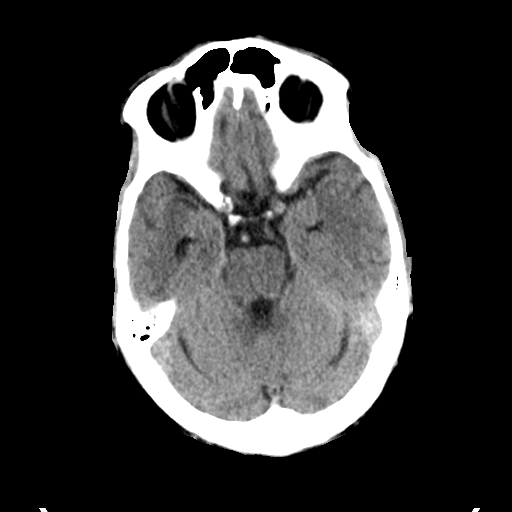
[im 14/31  brain]
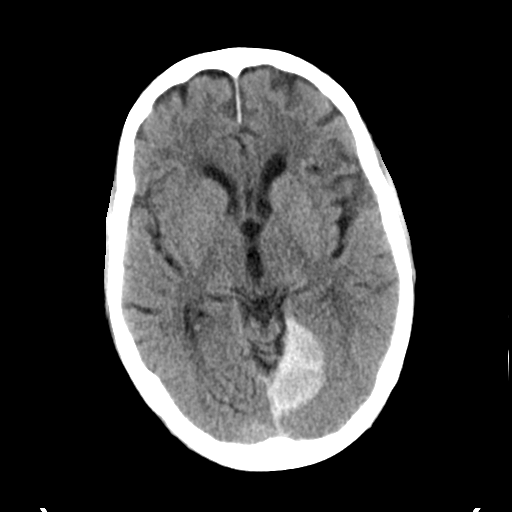
[im 17/31  brain]
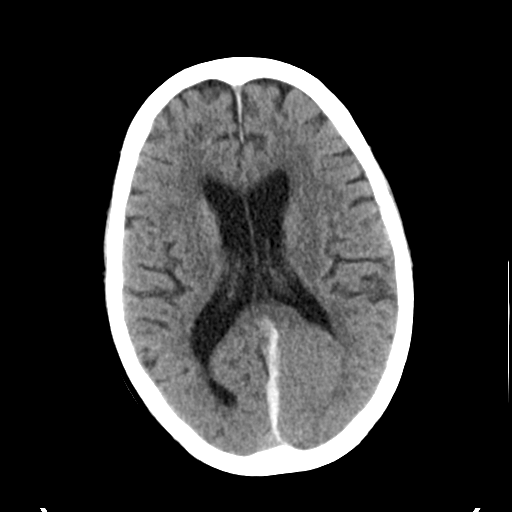
[im 17/31  bone]
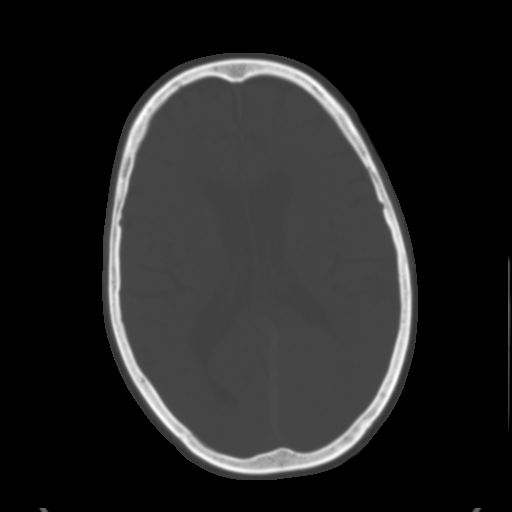
[im 21/31  brain]
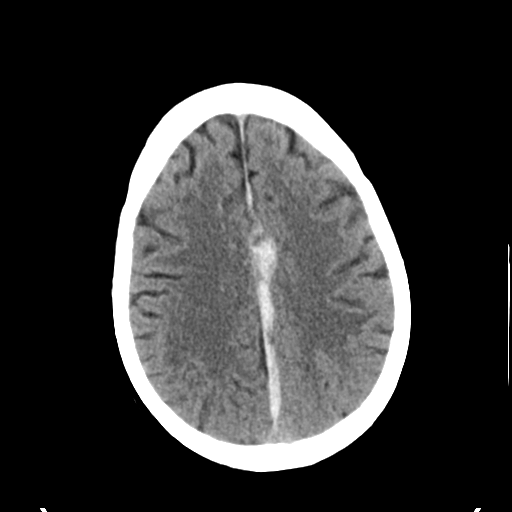
[im 24/31  brain]
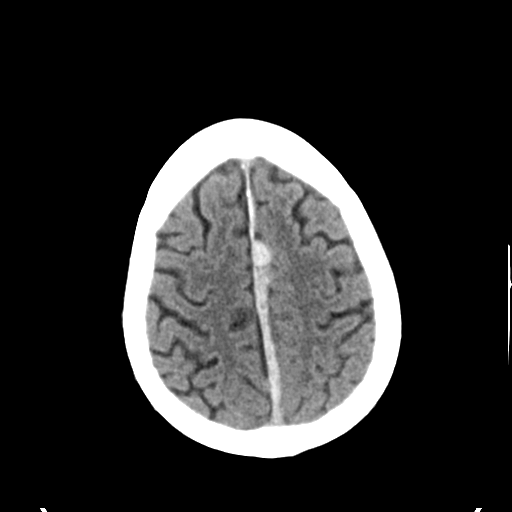
[im 28/31  brain]
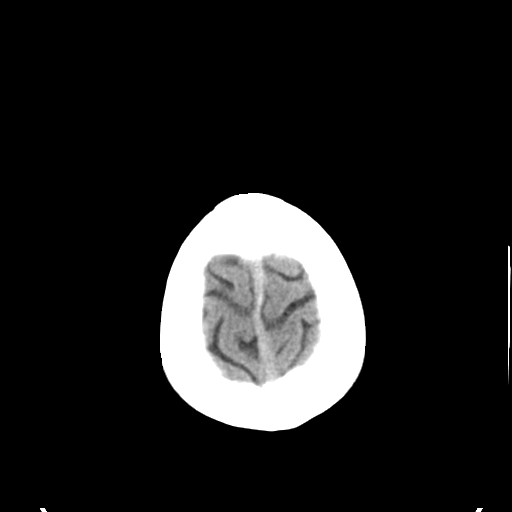

[Series 5: head 3.0 mpr cor · coronal · 0.33mm/px · 3 of 65 slices shown]
[im 22/65  brain]
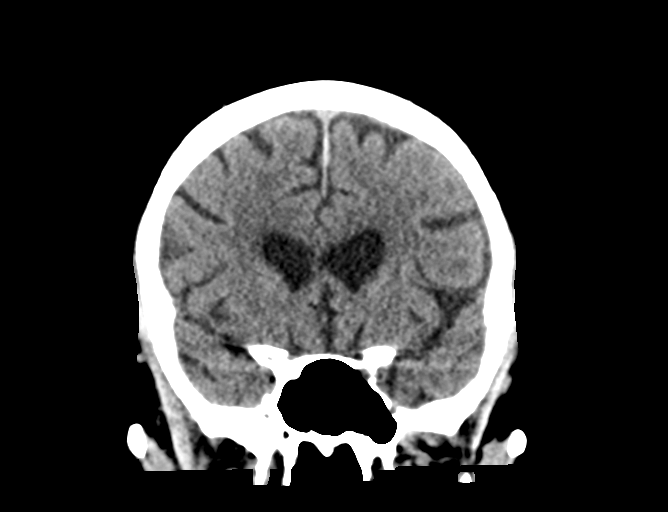
[im 29/65  brain]
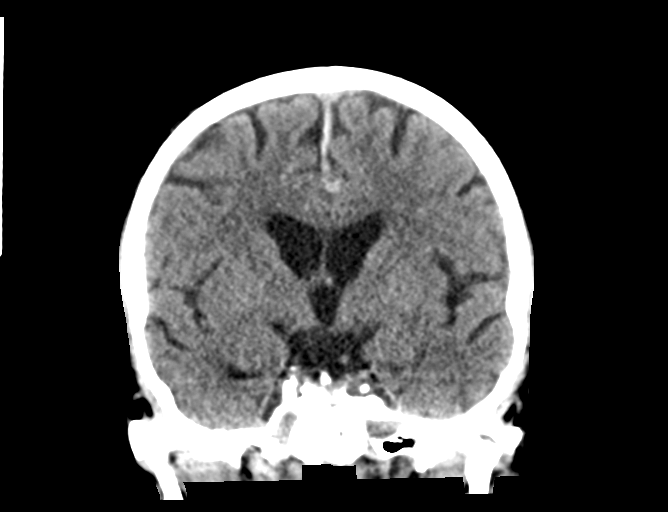
[im 36/65  brain]
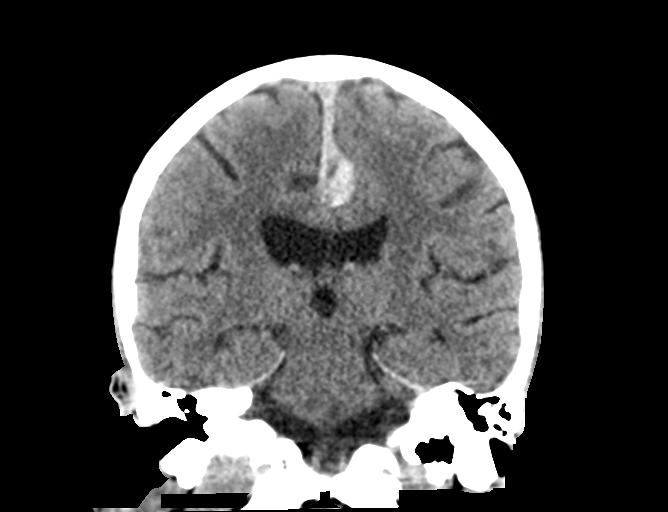

[Series 6: head 3.0 mpr sag · sagittal · 0.34mm/px · 3 of 49 slices shown]
[im 17/49  brain]
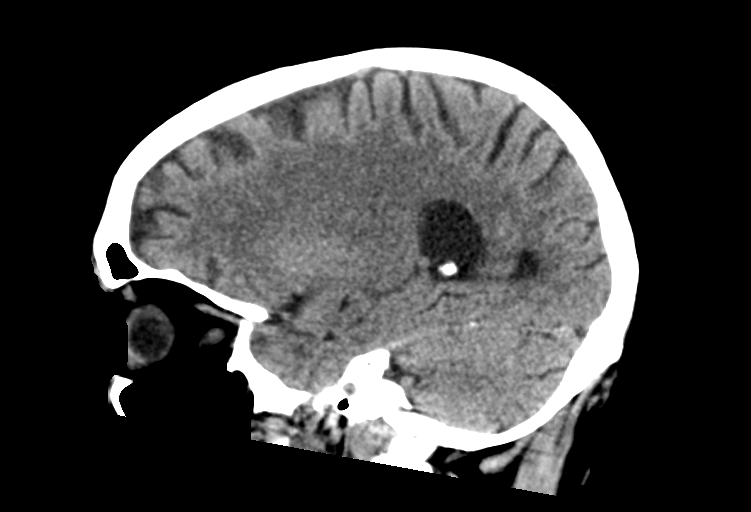
[im 25/49  brain]
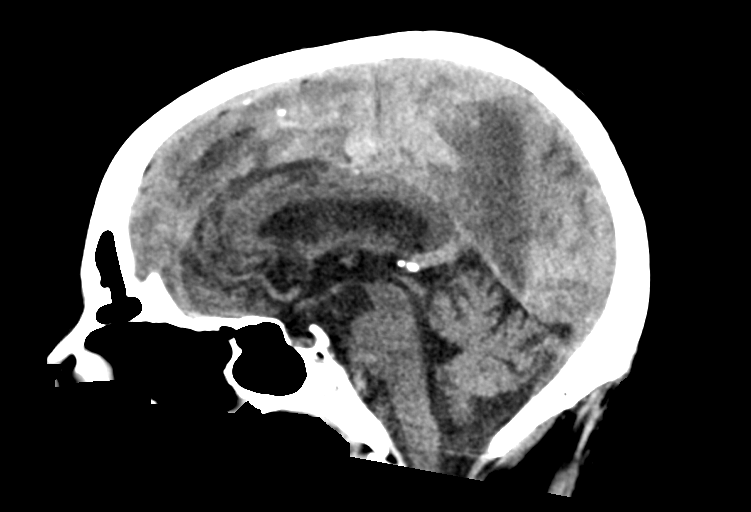
[im 33/49  brain]
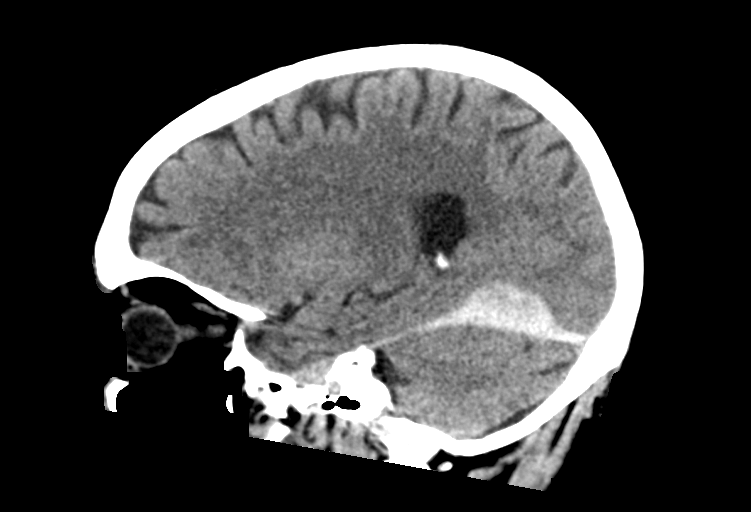

[14 of 47 positions shown; findings below may reference images not displayed]

FINDINGS: Brain: Left-sided interhemispheric subdural hematoma unchanged
measuring up to 13 mm in thickness. Large left tentorial subdural
hematoma also unchanged measuring up to 16 mm in thickness. No new
area of hemorrhage. No subarachnoid hemorrhage.

Generalized atrophy and chronic microvascular ischemia. No acute
infarct. No midline shift. Negative for mass lesion.

Vascular: Negative for hyperdense vessel

Skull: Negative

Sinuses/Orbits: Mild mucosal edema paranasal sinuses. Negative orbit

Other: None
IMPRESSION: Subdural hematoma along the falx and tentorium on the left unchanged
from earlier today. No midline shift. No new hemorrhage.

## 2019-03-15 ENCOUNTER — Emergency Department (HOSPITAL_COMMUNITY): Payer: Medicare PPO

## 2019-03-15 ENCOUNTER — Inpatient Hospital Stay (HOSPITAL_COMMUNITY)
Admission: EM | Admit: 2019-03-15 | Discharge: 2019-03-17 | DRG: 842 | Disposition: A | Discharge status: Home / Self Care | Payer: Medicare PPO | Attending: Internal Medicine | Admitting: Internal Medicine

## 2019-03-15 ENCOUNTER — Encounter (HOSPITAL_COMMUNITY): Payer: Self-pay | Admitting: Obstetrics and Gynecology

## 2019-03-15 ENCOUNTER — Other Ambulatory Visit: Payer: Self-pay

## 2019-03-15 DIAGNOSIS — R609 Edema, unspecified: Secondary | ICD-10-CM

## 2019-03-15 DIAGNOSIS — R600 Localized edema: Secondary | ICD-10-CM | POA: Diagnosis not present

## 2019-03-15 DIAGNOSIS — R569 Unspecified convulsions: Secondary | ICD-10-CM

## 2019-03-15 DIAGNOSIS — D638 Anemia in other chronic diseases classified elsewhere: Secondary | ICD-10-CM | POA: Diagnosis present

## 2019-03-15 DIAGNOSIS — J449 Chronic obstructive pulmonary disease, unspecified: Secondary | ICD-10-CM | POA: Diagnosis not present

## 2019-03-15 DIAGNOSIS — R161 Splenomegaly, not elsewhere classified: Secondary | ICD-10-CM | POA: Diagnosis present

## 2019-03-15 DIAGNOSIS — D6959 Other secondary thrombocytopenia: Secondary | ICD-10-CM | POA: Diagnosis present

## 2019-03-15 DIAGNOSIS — E785 Hyperlipidemia, unspecified: Secondary | ICD-10-CM | POA: Diagnosis present

## 2019-03-15 DIAGNOSIS — D649 Anemia, unspecified: Secondary | ICD-10-CM | POA: Diagnosis not present

## 2019-03-15 DIAGNOSIS — R0600 Dyspnea, unspecified: Secondary | ICD-10-CM

## 2019-03-15 DIAGNOSIS — R0602 Shortness of breath: Secondary | ICD-10-CM | POA: Diagnosis not present

## 2019-03-15 DIAGNOSIS — Z8669 Personal history of other diseases of the nervous system and sense organs: Secondary | ICD-10-CM

## 2019-03-15 DIAGNOSIS — D7581 Myelofibrosis: Principal | ICD-10-CM | POA: Diagnosis present

## 2019-03-15 DIAGNOSIS — D72822 Plasmacytosis: Secondary | ICD-10-CM

## 2019-03-15 DIAGNOSIS — Z20828 Contact with and (suspected) exposure to other viral communicable diseases: Secondary | ICD-10-CM | POA: Diagnosis present

## 2019-03-15 DIAGNOSIS — D696 Thrombocytopenia, unspecified: Secondary | ICD-10-CM | POA: Diagnosis present

## 2019-03-15 DIAGNOSIS — I10 Essential (primary) hypertension: Secondary | ICD-10-CM | POA: Diagnosis present

## 2019-03-15 DIAGNOSIS — Z87891 Personal history of nicotine dependence: Secondary | ICD-10-CM

## 2019-03-15 DIAGNOSIS — Z79899 Other long term (current) drug therapy: Secondary | ICD-10-CM

## 2019-03-15 DIAGNOSIS — Z806 Family history of leukemia: Secondary | ICD-10-CM

## 2019-03-15 DIAGNOSIS — J441 Chronic obstructive pulmonary disease with (acute) exacerbation: Secondary | ICD-10-CM | POA: Insufficient documentation

## 2019-03-15 DIAGNOSIS — Z95828 Presence of other vascular implants and grafts: Secondary | ICD-10-CM

## 2019-03-15 DIAGNOSIS — E039 Hypothyroidism, unspecified: Secondary | ICD-10-CM | POA: Diagnosis present

## 2019-03-15 DIAGNOSIS — Z86718 Personal history of other venous thrombosis and embolism: Secondary | ICD-10-CM

## 2019-03-15 DIAGNOSIS — D72829 Elevated white blood cell count, unspecified: Secondary | ICD-10-CM | POA: Diagnosis present

## 2019-03-15 DIAGNOSIS — Z79890 Hormone replacement therapy (postmenopausal): Secondary | ICD-10-CM

## 2019-03-15 DIAGNOSIS — Z885 Allergy status to narcotic agent status: Secondary | ICD-10-CM

## 2019-03-15 DIAGNOSIS — Z85820 Personal history of malignant melanoma of skin: Secondary | ICD-10-CM

## 2019-03-15 DIAGNOSIS — Z7901 Long term (current) use of anticoagulants: Secondary | ICD-10-CM

## 2019-03-15 LAB — COMPREHENSIVE METABOLIC PANEL
ALT: 24 U/L (ref 0–44)
AST: 19 U/L (ref 15–41)
Albumin: 4 g/dL (ref 3.5–5.0)
Alkaline Phosphatase: 104 U/L (ref 38–126)
Anion gap: 8 (ref 5–15)
BUN: 17 mg/dL (ref 8–23)
CO2: 24 mmol/L (ref 22–32)
Calcium: 8.7 mg/dL — ABNORMAL LOW (ref 8.9–10.3)
Chloride: 104 mmol/L (ref 98–111)
Creatinine, Ser: 1.13 mg/dL (ref 0.61–1.24)
GFR calc Af Amer: >60 mL/min (ref >60)
GFR calc non Af Amer: >60 mL/min (ref >60)
Glucose, Bld: 94 mg/dL (ref 70–99)
Potassium: 4.4 mmol/L (ref 3.5–5.1)
Sodium: 136 mmol/L (ref 135–145)
Total Bilirubin: 1.3 mg/dL — ABNORMAL HIGH (ref 0.3–1.2)
Total Protein: 6.9 g/dL (ref 6.5–8.1)

## 2019-03-15 LAB — POC OCCULT BLOOD, ED: Fecal Occult Bld: NEGATIVE

## 2019-03-15 LAB — PREPARE RBC (CROSSMATCH)

## 2019-03-15 LAB — RETICULOCYTES
Immature Retic Fract: 38.3 % — ABNORMAL HIGH (ref 2.3–15.9)
RBC.: 1.99 MIL/uL — ABNORMAL LOW (ref 4.22–5.81)
Retic Count, Absolute: 134.3 10*3/uL (ref 19.0–186.0)
Retic Ct Pct: 6.8 % — ABNORMAL HIGH (ref 0.4–3.1)

## 2019-03-15 LAB — BRAIN NATRIURETIC PEPTIDE: B Natriuretic Peptide: 148.2 pg/mL — ABNORMAL HIGH (ref 0.0–100.0)

## 2019-03-15 LAB — POC SARS CORONAVIRUS 2 AG -  ED: SARS Coronavirus 2 Ag: NEGATIVE

## 2019-03-15 MED ORDER — METHYLPREDNISOLONE SODIUM SUCC 125 MG IJ SOLR
125.0000 mg | Freq: Once | INTRAMUSCULAR | Status: AC
  Administered 2019-03-15: 125 mg via INTRAVENOUS
  Filled 2019-03-15: qty 2

## 2019-03-15 MED ORDER — SODIUM CHLORIDE (PF) 0.9 % IJ SOLN
INTRAMUSCULAR | Status: AC
  Filled 2019-03-15: qty 50

## 2019-03-15 MED ORDER — AEROCHAMBER PLUS FLO-VU MEDIUM MISC
1.0000 | Freq: Once | Status: AC
  Administered 2019-03-15: 1
  Filled 2019-03-15: qty 1

## 2019-03-15 MED ORDER — ALBUTEROL SULFATE HFA 108 (90 BASE) MCG/ACT IN AERS
2.0000 | INHALATION_SPRAY | Freq: Once | RESPIRATORY_TRACT | Status: AC
  Administered 2019-03-15: 2 via RESPIRATORY_TRACT
  Filled 2019-03-15: qty 6.7

## 2019-03-15 MED ORDER — SODIUM CHLORIDE 0.9% IV SOLUTION
Freq: Once | INTRAVENOUS | Status: AC
  Administered 2019-03-16: 04:00:00 via INTRAVENOUS

## 2019-03-15 MED ORDER — IOHEXOL 300 MG/ML  SOLN
100.0000 mL | Freq: Once | INTRAMUSCULAR | Status: AC | PRN
  Administered 2019-03-16: 100 mL via INTRAVENOUS

## 2019-03-15 MED ORDER — IPRATROPIUM BROMIDE HFA 17 MCG/ACT IN AERS
2.0000 | INHALATION_SPRAY | Freq: Once | RESPIRATORY_TRACT | Status: AC
  Administered 2019-03-15: 2 via RESPIRATORY_TRACT
  Filled 2019-03-15: qty 12.9

## 2019-03-15 MED ORDER — TRAZODONE HCL 50 MG PO TABS
150.0000 mg | ORAL_TABLET | Freq: Every day | ORAL | Status: DC
  Administered 2019-03-15: 150 mg via ORAL
  Filled 2019-03-15: qty 1

## 2019-03-15 NOTE — ED Notes (Signed)
ED Provider at bedside. 

## 2019-03-15 NOTE — ED Triage Notes (Signed)
Patient reports SOB and bilateral leg swelling. Patient reports COPD hx and multiple pneumonias in the last few years. Patient reports he has no hx of CHF. Denies N/V/D or chest pain

## 2019-03-15 NOTE — ED Notes (Signed)
Admitting MD at bedside.

## 2019-03-15 NOTE — Progress Notes (Signed)
VASCULAR LAB PRELIMINARY  PRELIMINARY  PRELIMINARY  PRELIMINARY  Bilateral lower extremity venous duplex completed.    Preliminary report:  See CV proc for preliminary results.  Gave report to Dr. Loreen Freud, [facility], RVT 03/15/2019, 7:07 PM

## 2019-03-15 NOTE — ED Notes (Addendum)
PT ambulated to restroom with no concerns. PT did report increase SHOB PT o2 98-100% when returned to room.

## 2019-03-15 NOTE — ED Notes (Signed)
Vascular paged. 

## 2019-03-15 NOTE — ED Provider Notes (Signed)
[facility] DEPT Provider Note   CSN: [id number] Arrival date & time: 03/15/19  1702     History   Chief Complaint Chief Complaint  Patient presents with   Leg Swelling   Shortness of Breath    HPI Carlos Vaughn is a 73 y.o. male w PMHx COPD, DVT on eliquis, myelofibrosis, presenting to the ED with complaint of gradual onset of shortness of breath that began about 1 week ago. Reports sx have been progressively worsening. He reports orthopnea and DOE, states "I feel like I can't breathe." He also endorses a new b/l lower leg swelling. He denies cough, fever, chest pain. No known COVID contacts, with neg test 3 weeks ago. No known hx of CHF. He reports his PCP recently decreased his dose of eliquis about 1 week ago due to increased bruising, and he has been compliant with this medication, not missed any doses. No hx of PE.      The history is provided by the patient.    Past Medical History:  Diagnosis Date   Anxiety    COPD (chronic obstructive pulmonary disease) ([place])    Depression    DVT (deep venous thrombosis) ([place])    Hyperlipemia    Melanoma ([place])    Myelofibrosis (HCC)    PTSD (post-traumatic stress disorder)     Patient Active Problem List   Diagnosis Date Noted   Partial symptomatic epilepsy with complex partial seizures, not intractable, without status epilepticus ([place]) 07/21/2017   Acute deep vein thrombosis (DVT) of distal vein of lower extremity (HCC)    Acute deep vein thrombosis (DVT) of proximal vein of lower extremity (HCC)    Subdural hematoma (HCC)    Chronic obstructive pulmonary disease (HCC)    PTSD (post-traumatic stress disorder)    Seizures (HCC)    Leukocytosis    Thrombocytopenia (HCC)    Anemia of chronic disease    Subdural hemorrhage ([place]) 05/07/2017   Anticoagulated for Rt Leg DVT 05/07/2017   History of DVT of lower extremity 02/2017 05/07/2017    Past Surgical History:  Procedure  Laterality Date   HERNIA REPAIR     IR IVC FILTER PLMT / S&I /IMG GUID/MOD SED  05/08/2017   IR RADIOLOGIST EVAL & MGMT  07/19/2017   MELANOMA EXCISION     removed off back        Home Medications    Prior to Admission medications   Medication Sig Start Date End Date Taking? Authorizing Provider  apixaban (ELIQUIS) 2.5 MG TABS tablet Take 2.5 mg by mouth 2 (two) times daily.   Yes [provider]  Ipratropium-Albuterol (COMBIVENT RESPIMAT) 20-100 MCG/ACT AERS respimat Inhale 1 puff into the lungs every 6 (six) hours as needed for wheezing or shortness of breath.    Yes [provider]  levothyroxine (SYNTHROID) 75 MCG tablet Take 75 mcg by mouth daily before breakfast.   Yes [provider]  Multiple Vitamin (MULTIVITAMIN WITH MINERALS) TABS tablet Take 1 tablet by mouth daily.   Yes [provider]  prazosin (MINIPRESS) 2 MG capsule Take 2 mg by mouth at bedtime.    Yes [provider]  ruxolitinib phosphate (JAKAFI) 10 MG tablet Take 10-15 mg by mouth 2 (two) times daily. Take 10mg  in am and 15mg  in pm.   Yes [provider]  simvastatin (ZOCOR) 40 MG tablet Take 40 mg by mouth at bedtime.    Yes [provider]  traZODone (DESYREL) 150 MG  tablet Take 150 mg by mouth at bedtime.   Yes [provider]    Family History Family History  Problem Relation Age of Onset   Pneumonia Mother    Breast cancer Mother    Leukemia Father    Cancer Sister        unsure of type - thinks it was breast cancer    Social History Social History   Tobacco Use   Smoking status: Former Smoker   Smokeless tobacco: Never Used  Substance Use Topics   Alcohol use: Yes    Comment: 2 beers twice weekly   Drug use: No     Allergies   Oxycodone   Review of Systems Review of Systems  Constitutional: Negative for fever.  Respiratory: Positive for shortness of breath. Negative for cough.   Cardiovascular: Positive  for leg swelling. Negative for chest pain.  All other systems reviewed and are negative.    Physical Exam Updated Vital Signs BP 139/60    Pulse 76    Temp 98.7 F (37.1 C) (Oral)    Resp (!) 24    SpO2 95%   Physical Exam Vitals signs and nursing note reviewed.  Constitutional:      General: He is not in acute distress.    Appearance: He is well-developed.  HENT:     Head: Normocephalic and atraumatic.  Eyes:     Conjunctiva/sclera: Conjunctivae normal.  Cardiovascular:     Rate and Rhythm: Normal rate and regular rhythm.     Comments: 3+ pedal and pretibial pitting edema BLE. Edema does not extend passed the knees. Pulmonary:     Comments: Pt speaking in short sentences with mildly increased respiratory effort. Worsened when laying down. Diffuse faint wheezes, diminished b/l. Abdominal:     Palpations: Abdomen is soft.  Skin:    General: Skin is warm.  Neurological:     Mental Status: He is alert.  Psychiatric:        Behavior: Behavior normal.      ED Treatments / Results  Labs (all labs ordered are listed, but only abnormal results are displayed) Labs Reviewed  BRAIN NATRIURETIC PEPTIDE - Abnormal; Notable for the following components:      Result Value   B Natriuretic Peptide 148.2 (*)    All other components within normal limits  COMPREHENSIVE METABOLIC PANEL - Abnormal; Notable for the following components:   Calcium 8.7 (*)    Total Bilirubin 1.3 (*)    All other components within normal limits  CBC WITH DIFFERENTIAL/PLATELET - Abnormal; Notable for the following components:   WBC 30.8 (*)    RBC 2.35 (*)    Hemoglobin 6.9 (*)    HCT 23.7 (*)    MCV 100.9 (*)    MCHC 29.1 (*)    RDW 21.8 (*)    Platelets 57 (*)    nRBC 48.4 (*)    Neutro Abs 11.2 (*)    Lymphs Abs 5.2 (*)    Monocytes Absolute 3.4 (*)    Eosinophils Absolute 1.6 (*)    Basophils Absolute 2.1 (*)    Abs Immature Granulocytes 7.38 (*)    All other components within normal limits   SARS CORONAVIRUS 2 (TAT 6-24 HRS)  PATHOLOGIST SMEAR REVIEW  VITAMIN B12  FOLATE  IRON AND TIBC  FERRITIN  RETICULOCYTES  POC OCCULT BLOOD, ED  POC SARS CORONAVIRUS 2 AG -  ED  TYPE AND SCREEN  PREPARE RBC (CROSSMATCH)  TROPONIN I (HIGH  SENSITIVITY)    EKG EKG Interpretation  Date/Time:  Thursday March 15 2019 17:41:44 EST Ventricular Rate:  72 PR Interval:    QRS Duration: 87 QT Interval:  417 QTC Calculation: 457 R Axis:   42 Text Interpretation: Sinus rhythm Low voltage, precordial leads since last tracing no significant change Confirmed by Malvin Johns [phone]) on 03/15/2019 5:59:55 PM   Radiology Dg Chest 2 View  Result Date: 03/15/2019 CLINICAL DATA:  Shortness of breath EXAM: CHEST - 2 VIEW COMPARISON:  12/26/2017 FINDINGS: The heart size and mediastinal contours are within normal limits. Both lungs are clear. The visualized skeletal structures are unremarkable. IMPRESSION: No active cardiopulmonary disease. Electronically Signed   By: Constance Holster M.D.   On: 03/15/2019 17:58   Vas [place] Lower Extremity Venous (dvt) (only [facility])  Result Date: 03/15/2019  Lower Venous Study Indications: SOB, and Edema.  Risk Factors: COPD. Comparison Study: Prior study from 04/20/17 is available for comparison Performing Technologist: Sharion Dove RVS  Examination Guidelines: A complete evaluation includes B-mode imaging, spectral Doppler, color Doppler, and power Doppler as needed of all accessible portions of each vessel. Bilateral testing is considered an integral part of a complete examination. Limited examinations for reoccurring indications may be performed as noted.  +---------+---------------+---------+-----------+----------+--------------+  RIGHT     Compressibility Phasicity Spontaneity Properties Thrombus Aging  +---------+---------------+---------+-----------+----------+--------------+  CFV       Full                                                              +---------+---------------+---------+-----------+----------+--------------+  SFJ       Full                                                             +---------+---------------+---------+-----------+----------+--------------+  FV Prox   Partial                                          Chronic         +---------+---------------+---------+-----------+----------+--------------+  FV Mid                    Yes       Yes                    Chronic         +---------+---------------+---------+-----------+----------+--------------+  FV Distal                 Yes       Yes                    Chronic         +---------+---------------+---------+-----------+----------+--------------+  PFV       Full                                                             +---------+---------------+---------+-----------+----------+--------------+  POP       Partial         Yes       Yes                    Chronic         +---------+---------------+---------+-----------+----------+--------------+  PTV       Full                                                             +---------+---------------+---------+-----------+----------+--------------+  PERO      Full                                                             +---------+---------------+---------+-----------+----------+--------------+   +---------+---------------+---------+-----------+----------+-------------------+  LEFT      Compressibility Phasicity Spontaneity Properties Thrombus Aging       +---------+---------------+---------+-----------+----------+-------------------+  CFV       Full            Yes       Yes                                         +---------+---------------+---------+-----------+----------+-------------------+  SFJ       Full                                                                  +---------+---------------+---------+-----------+----------+-------------------+  FV Prox   Full                                                                   +---------+---------------+---------+-----------+----------+-------------------+  FV Mid                    Yes       Yes                    patent by color and                                                              Doppler              +---------+---------------+---------+-----------+----------+-------------------+  FV Distal                 Yes       Yes  patent by color and                                                              Doppler              +---------+---------------+---------+-----------+----------+-------------------+  PFV       Full                                                                  +---------+---------------+---------+-----------+----------+-------------------+  POP       Full            Yes       Yes                                         +---------+---------------+---------+-----------+----------+-------------------+  PTV       Full                                                                  +---------+---------------+---------+-----------+----------+-------------------+  PERO      Full                                                                  +---------+---------------+---------+-----------+----------+-------------------+     Summary: Right: Findings consistent with 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 Findings appear essentially unchanged compared to previous examination. Interstitial fluid noted in the calf Left: There is no evidence of deep vein thrombosis in the lower extremity. Interstitial fluid noted in the calf  *See table(s) above for measurements and observations.    Preliminary     Procedures .Critical Care Performed by: Loman Logan, [place] N, PA-C Authorized by: Finley Chevez, [place] N, PA-C   Critical care provider statement:    Critical care time (minutes):  45   Critical care time was exclusive of:  Separately billable procedures and treating other patients and teaching  time   Critical care was necessary to treat or prevent imminent or life-threatening deterioration of the following conditions:  Circulatory failure and respiratory failure   Critical care was time spent personally by me on the following activities:  Discussions with consultants, evaluation of patient's response to treatment, examination of patient, ordering and performing treatments and interventions, ordering and review of laboratory studies, ordering and review of radiographic studies, pulse oximetry, re-evaluation of patient's condition, obtaining history from patient or surrogate and review of old charts   I assumed direction of critical care for this patient from another provider in my specialty: no     (  including critical care time)  Medications Ordered in ED Medications  0.9 %  sodium chloride infusion (Manually program via Guardrails IV Fluids) (has no administration in time range)  traZODone (DESYREL) tablet 150 mg (150 mg Oral Given 03/15/19 2252)  albuterol (VENTOLIN HFA) 108 (90 Base) MCG/ACT inhaler 2 puff (2 puffs Inhalation Given 03/15/19 1826)  AeroChamber Plus Flo-Vu Medium MISC 1 each (1 each Other Given 03/15/19 1826)  ipratropium (ATROVENT HFA) inhaler 2 puff (2 puffs Inhalation Given 03/15/19 2135)  methylPREDNISolone sodium succinate (SOLU-MEDROL) 125 mg/2 mL injection 125 mg (125 mg Intravenous Given 03/15/19 2137)     Initial Impression / Assessment and Plan / ED Course  I have reviewed the triage vital signs and the nursing notes.  Pertinent labs & imaging results that were available during my care of the patient were reviewed by me and considered in my medical decision making (see chart for details).  Clinical Course as of Mar 14 2324  Thu Mar 15, 2019  2309 Discussed with Dr. Alvy Bimler with oncology. Recommends smear is consistent with myelofibrosis, and pt is appropriate for [facility] admission. Recommends transfuse 2 units.   [JR]  2312 Dr. Roel Cluck accepting admission.  Requesting CT chest/abd to evaluate IVC filter   [JR]    Clinical Course User Index [JR] Burt Piatek, [place] N, PA-C       Pt w hx of myelofibrosis, COPD, DVT on eliquis, presenting with 1 week of progressively worsening SOB with DOE and orthopnea, as well as and BLE edema. No known hx of CHF. On evaluation, pt with inc work of breathing, though is oxygenating well. Lungs sounds are diminished b/l. 3+ pitting edema b/l lower legs. Workup initiated with concern for possible new onset CHF. Albuterol given for SOB.  Labs reveal significant leukocytosis, which appears to be unchanged per patient. He is being seen outpt by [facility] hematology for this and recently had bone marrow bx, awaiting results. Hgb is significantly low at 6.9, pt unaware of baseline however last documented hgb on our records is 10.7 one year ago. No obvious source of bleeding, neg hemoccult, though pt is on eliquis.  Differential is also abnormal. CMP and BNP are unremarkable. Unlikely new onset CHF. POC covid is neg. CXR is neg for fluid or infiltrate. DVT study is neg. Suspect sx are related to both COPD and anemia. Pt will require transfusion and admission for further management.  Patient was discussed with and evaluated by Dr. Tamera Punt.  The patient appears reasonably stabilized for admission considering the current resources, flow, and capabilities available in the ED at this time, and I doubt any other [facility] requiring further screening and/or treatment in the ED prior to admission.   Final Clinical Impressions(s) / ED Diagnoses   Final diagnoses:  Shortness of breath  Bilateral lower extremity edema  Symptomatic anemia    ED Discharge Orders    None       Rollen Selders, [place] N, PA-C 03/15/19 2325    Malvin Johns, MD 03/15/19 2329

## 2019-03-15 NOTE — ED Notes (Signed)
Registration at bedside.

## 2019-03-15 NOTE — ED Notes (Signed)
Date and time results received: 03/15/19 7:57 PM  (use smartphrase ".now" to insert current time)  Test: Hemoglobin Critical Value: 6.9  Name of Provider [place] PA  Orders Received? Or Actions Taken?:

## 2019-03-15 NOTE — H&P (Signed)
Carlos Vaughn EGB:[id number] DOB: [birth year] DOA: 03/15/2019     PCP: [name]    Patient arrived to ER on 03/15/19 at 1702  Patient coming from: home Lives  With family    Chief Complaint:   Chief Complaint  Patient presents with  . Leg Swelling  . Shortness of Breath    HPI: Carlos Vaughn is a 73 y.o. male with medical history significant of myelofibrosis and significant splenomegaly COPD, subdural hematoma in 2019, seizure, hypertension hyperlipidemia PTSD right lower extremity DVT in 2018 again in 2019 status post IVC filter placed in 2019, melanoma,     Presented with bilateral lower extremity swelling and shortness of breath [place] gradual onset of shortness of breath has been for the past 1 week.  Orthopnea.  He cannot breathe when he lays down flat.  No cough no fever no chest pain no known Covid contacts.  He has no prior history of CHF Has been having increased breathing and his PCP has decreased his Eliquis dose 1 week ago.  He has not missed any doses  Patient noted to be 98 200% room air when ambulating He denies any significant wheezing although he states occasionally he wheezes.  No recent coughing no fevers.  No blood in stools no black stools.  States he gets his care mostly at the [place] he is unsure what his baseline hemoglobin is   Infectious risk factors:  Reports shortness of breath, dry cough, chest pain, Sore throat,URI symptoms, anosmia/change in taste, N/V/Diarrhea/abdominal pain,  Body aches, severe fatigue sick contacts, known COVID 19 exposure, Travel to high risk area, Exposure to travelers from high risk area   Point of care negative  In  ER RAPID COVID TEST   in house testing  Pending  No results found for: SARSCOV2NAA   Regarding pertinent Chronic problems:   History of myelofibrosis     -currently undergoing treatment has significant splenomegaly  Seizures-used to be on Keppra has not been taking  for quite some time  Hyperlipidemia -  on statins Zocor   HTN on prazosin dates she actually takes it for nightmares    COPD - not on baseline oxygen    PF Readings from Last 1 Encounters:  No data found for PF      While in ER: Found to have hemoglobin down to 6.8 Chest x-ray unremarkable BNP 142 White blood cell count noted to be elevated at 30.8 Platelets down to 57 Patient is Hemoccult negative from below Patient had some relief of shortness of breath inhalers  ER Provider Called:   Hem/ONC  Dr. Alvy Bimler They Recommend admit to medicine  Felt the blood smear most likely secondary to myelofibrosis patient needs blood transfusion Please re consult if needed  The following Work up has been ordered so far:  Orders Placed This Encounter  Procedures  . SARS CORONAVIRUS 2 (TAT 6-24 HRS) Nasopharyngeal Nasopharyngeal Swab  . DG Chest 2 View  . Brain natriuretic peptide  . Comprehensive metabolic panel  . CBC with Differential  . Pathologist smear review  . Practitioner attestation of consent  . Complete patient signature process for consent form  . Consult to hospitalist  ALL PATIENTS BEING ADMITTED/HAVING PROCEDURES NEED COVID-19 SCREENING  . Airborne and Contact precautions  . POC occult blood, ED RN will collect  . POC SARS Coronavirus 2 Ag-ED - Nasal Swab (BD Veritor Kit)  . ED EKG  . EKG  12-Lead  . Type and screen [place]  . Prepare RBC  . VAS [place] LOWER EXTREMITY VENOUS (DVT) (ONLY [facility] & [facility])     Following Medications were ordered in ER: Medications  0.9 %  sodium chloride infusion (Manually program via Guardrails IV Fluids) (has no administration in time range)  albuterol (VENTOLIN HFA) 108 (90 Base) MCG/ACT inhaler 2 puff (2 puffs Inhalation Given 03/15/19 1826)  AeroChamber Plus Flo-Vu Medium MISC 1 each (1 each Other Given 03/15/19 1826)  ipratropium (ATROVENT HFA) inhaler 2 puff (2 puffs Inhalation Given 03/15/19 2135)  methylPREDNISolone  sodium succinate (SOLU-MEDROL) 125 mg/2 mL injection 125 mg (125 mg Intravenous Given 03/15/19 2137)        Consult Orders  (From admission, onward)         Start     Ordered   03/15/19 2216  Consult to hospitalist  ALL PATIENTS BEING ADMITTED/HAVING PROCEDURES NEED COVID-19 SCREENING  Once    Comments: ALL PATIENTS BEING ADMITTED/HAVING PROCEDURES NEED COVID-19 SCREENING  Provider:  (Not yet assigned)  Question Answer Comment  Place call to: [facility]   Reason for Consult Admit      03/15/19 2215          Significant initial  Findings: Abnormal Labs Reviewed  BRAIN NATRIURETIC PEPTIDE - Abnormal; Notable for the following components:      Result Value   B Natriuretic Peptide 148.2 (*)    All other components within normal limits  COMPREHENSIVE METABOLIC PANEL - Abnormal; Notable for the following components:   Calcium 8.7 (*)    Total Bilirubin 1.3 (*)    All other components within normal limits  CBC WITH DIFFERENTIAL/PLATELET - Abnormal; Notable for the following components:   WBC 30.8 (*)    RBC 2.35 (*)    Hemoglobin 6.9 (*)    HCT 23.7 (*)    MCV 100.9 (*)    MCHC 29.1 (*)    RDW 21.8 (*)    Platelets 57 (*)    nRBC 48.4 (*)    Neutro Abs 11.2 (*)    Lymphs Abs 5.2 (*)    Monocytes Absolute 3.4 (*)    Eosinophils Absolute 1.6 (*)    Basophils Absolute 2.1 (*)    Abs Immature Granulocytes 7.38 (*)    All other components within normal limits    Otherwise labs showing:    Recent Labs  Lab 03/15/19 1832  NA 136  K 4.4  CO2 24  GLUCOSE 94  BUN 17  CREATININE 1.13  CALCIUM 8.7*    Cr    stable,    Lab Results  Component Value Date   CREATININE 1.13 03/15/2019   CREATININE 1.05 12/26/2017   CREATININE 0.99 06/17/2017    Recent Labs  Lab 03/15/19 1832  AST 19  ALT 24  ALKPHOS 104  BILITOT 1.3*  PROT 6.9  ALBUMIN 4.0   Lab Results  Component Value Date   CALCIUM 8.7 (L) 03/15/2019     WBC      Component Value Date/Time    WBC 30.8 (H) 03/15/2019 1832   ANC    Component Value Date/Time   NEUTROABS 11.2 (H) 03/15/2019 1832   ALC No components found for: LYMPHAB    Plt: Lab Results  Component Value Date   PLT 57 (L) 03/15/2019    Lactic Acid, Venous    Component Value Date/Time   LATICACIDVEN 0.56 05/07/2017 1105       COVID-19 Labs  No results for input(s): DDIMER, FERRITIN, LDH, CRP in the last 72 hours.  No results found for: SARSCOV2NAA   HG/HCT        Component Value Date/Time   HGB 6.9 (LL) 03/15/2019 1832   HCT 23.7 (L) 03/15/2019 1832     ECG: Ordered Personally reviewed by me showing: HR : 72  Rhythm: NSR   no evidence of ischemic changes QTC 457   BNP (last 3 results) Recent Labs    03/15/19 1832  BNP 148.2*    ProBNP (last 3 results) No results for input(s): PROBNP in the last 8760 hours.  DM  labs:  HbA1C: No results for input(s): HGBA1C in the last 8760 hours.     CBG (last 3)  No results for input(s): GLUCAP in the last 72 hours.     UA  no evidence of UTI     Urine analysis:    Component Value Date/Time   COLORURINE YELLOW 05/07/2017 1650   APPEARANCEUR CLEAR 05/07/2017 1650   LABSPEC 1.019 05/07/2017 1650   PHURINE 5.0 05/07/2017 1650   GLUCOSEU NEGATIVE 05/07/2017 1650   HGBUR NEGATIVE 05/07/2017 1650   BILIRUBINUR NEGATIVE 05/07/2017 1650   KETONESUR 20 (A) 05/07/2017 1650   PROTEINUR NEGATIVE 05/07/2017 1650   NITRITE NEGATIVE 05/07/2017 1650   LEUKOCYTESUR NEGATIVE 05/07/2017 1650      Ordered    CXR -   NON acute  CTabd/pelvis -ordered  CT chest -  ordered      ED Triage Vitals  Enc Vitals Group     BP 03/15/19 1713 (!) 156/67     Pulse Rate 03/15/19 1713 70     Resp 03/15/19 1713 20     Temp 03/15/19 1713 98.7 F (37.1 C)     Temp Source 03/15/19 1713 Oral     SpO2 03/15/19 1713 100 %     Weight --      Height --      Head Circumference --      Peak Flow --      Pain Score 03/15/19 1714 0     Pain Loc --       Pain Edu? --      Excl. in [place]? --   TMAX(24)@       Latest  Blood pressure 131/70, pulse 93, temperature 98.7 F (37.1 C), temperature source Oral, resp. rate (!) 25, SpO2 95 %.     Hospitalist was called for admission for   symptoamtic anemia, dyspnea    Review of Systems:    Pertinent positives include:  Bilateral lower extremity swelling   shortness of breath at rest. dyspnea on exertion  Constitutional:  No weight loss, night sweats, Fevers, chills, fatigue, weight loss  HEENT:  No headaches, Difficulty swallowing,Tooth/dental problems,Sore throat,  No sneezing, itching, ear ache, nasal congestion, post nasal drip,  Cardio-vascular:  No chest pain, Orthopnea, PND, anasarca, dizziness, palpitations.noGI:  No heartburn, indigestion, abdominal pain, nausea, vomiting, diarrhea, change in bowel habits, loss of appetite, melena, blood in stool, hematemesis Resp:   No excess mucus, no productive cough, No non-productive cough, No coughing up of blood.No change in color of mucus.No wheezing. Skin:  no rash or lesions. No jaundice GU:  no dysuria, change in color of urine, no urgency or frequency. No straining to urinate.  No flank pain.  Musculoskeletal:  No joint pain or no joint swelling. No decreased range of motion. No back pain.  Psych:  No change in mood or affect.  No depression or anxiety. No memory loss.  Neuro: no localizing neurological complaints, no tingling, no weakness, no double vision, no gait abnormality, no slurred speech, no confusion  All systems reviewed and apart from [place] all are negative  Past Medical History:   Past Medical History:  Diagnosis Date  . Anxiety   . COPD (chronic obstructive pulmonary disease) ([place])   . Depression   . DVT (deep venous thrombosis) ([place])   . Hyperlipemia   . Melanoma ([place])   . Myelofibrosis ([place])   . PTSD (post-traumatic stress disorder)      Past Surgical History:  Procedure Laterality Date  . HERNIA REPAIR     . IR IVC FILTER PLMT / S&I /IMG GUID/MOD SED  05/08/2017  . IR RADIOLOGIST EVAL & MGMT  07/19/2017  . MELANOMA EXCISION     removed off back    Social History:  Ambulatory  Independently      reports that he has quit smoking. He has never used smokeless tobacco. He reports current alcohol use. He reports that he does not use drugs.     Family History:   Family History  Problem Relation Age of Onset  . Pneumonia Mother   . Breast cancer Mother   . Leukemia Father   . Cancer Sister        unsure of type - thinks it was breast cancer    Allergies: Allergies  Allergen Reactions  . Oxycodone Itching     Prior to Admission medications   Medication Sig Start Date End Date Taking? Authorizing Provider  apixaban (ELIQUIS) 2.5 MG TABS tablet Take 2.5 mg by mouth 2 (two) times daily.   Yes [provider]  Ipratropium-Albuterol (COMBIVENT RESPIMAT) 20-100 MCG/ACT AERS respimat Inhale 1 puff into the lungs every 6 (six) hours as needed for wheezing or shortness of breath.    Yes [provider]  levothyroxine (SYNTHROID) 75 MCG tablet Take 75 mcg by mouth daily before breakfast.   Yes [provider]  Multiple Vitamin (MULTIVITAMIN WITH MINERALS) TABS tablet Take 1 tablet by mouth daily.   Yes [provider]  prazosin (MINIPRESS) 2 MG capsule Take 2 mg by mouth at bedtime.    Yes [provider]  ruxolitinib phosphate (JAKAFI) 10 MG tablet Take 10-15 mg by mouth 2 (two) times daily. Take 31m in am and 153min pm.   Yes [provider]  simvastatin (ZOCOR) 40 MG tablet Take 40 mg by mouth at bedtime.    Yes [provider]  traZODone (DESYREL) 150 MG tablet Take 150 mg by mouth at bedtime.   Yes [provider]   Physical Exam: Blood pressure 131/70, pulse 93, temperature 98.7 F (37.1 C), temperature source Oral, resp. rate (!) 25, SpO2 95 %. 1. General:  in No Acute distress   Chronically ill  -appearing 2.  Psychological: Alert and   Oriented 3. Head/ENT:    Dry Mucous Membranes                          Head Non traumatic, neck supple                            Poor Dentition 4. SKIN:  decreased Skin turgor,  Skin clean Dry and intact no rash 5. Heart: Regular rate and rhythm no  Murmur, no Rub or gallop 6. Lungs:  Distant  no wheezes  or crackles   7. Abdomen: Soft,  non-tender,  distended  bowel sounds present significant splenomegaly noted 8. Lower extremities: no clubbing, cyanosis, 2+edema 9. Neurologically Grossly intact, moving all 4 extremities equally   10. MSK: Normal range of motion   All other LABS:     Recent Labs  Lab 03/15/19 1832  WBC 30.8*  NEUTROABS 11.2*  HGB 6.9*  HCT 23.7*  MCV 100.9*  PLT 57*     Recent Labs  Lab 03/15/19 1832  NA 136  K 4.4  CL 104  CO2 24  GLUCOSE 94  BUN 17  CREATININE 1.13  CALCIUM 8.7*     Recent Labs  Lab 03/15/19 1832  AST 19  ALT 24  ALKPHOS 104  BILITOT 1.3*  PROT 6.9  ALBUMIN 4.0       Cultures: No results found for: SDES, SPECREQUEST, CULT, REPTSTATUS   Radiological Exams on Admission: Dg Chest 2 View  Result Date: 03/15/2019 CLINICAL DATA:  Shortness of breath EXAM: CHEST - 2 VIEW COMPARISON:  12/26/2017 FINDINGS: The heart size and mediastinal contours are within normal limits. Both lungs are clear. The visualized skeletal structures are unremarkable. IMPRESSION: No active cardiopulmonary disease. Electronically Signed   By: Constance Holster M.D.   On: 03/15/2019 17:58   Vas [place] Lower Extremity Venous (dvt) (only [facility])  Result Date: 03/15/2019  Lower Venous Study Indications: SOB, and Edema.  Risk Factors: COPD. Comparison Study: Prior study from 04/20/17 is available for comparison Performing Technologist: Sharion Dove RVS  Examination Guidelines: A complete evaluation includes B-mode imaging, spectral Doppler, color Doppler, and power Doppler as needed of all accessible portions of each vessel.  Bilateral testing is considered an integral part of a complete examination. Limited examinations for reoccurring indications may be performed as noted.  +---------+---------------+---------+-----------+----------+--------------+ RIGHT    CompressibilityPhasicitySpontaneityPropertiesThrombus Aging +---------+---------------+---------+-----------+----------+--------------+ CFV      Full                                                        +---------+---------------+---------+-----------+----------+--------------+ SFJ      Full                                                        +---------+---------------+---------+-----------+----------+--------------+ FV Prox  Partial                                      Chronic        +---------+---------------+---------+-----------+----------+--------------+ FV Mid                  Yes      Yes                  Chronic        +---------+---------------+---------+-----------+----------+--------------+ FV Distal               Yes      Yes                  Chronic        +---------+---------------+---------+-----------+----------+--------------+ PFV  Full                                                        +---------+---------------+---------+-----------+----------+--------------+ POP      Partial        Yes      Yes                  Chronic        +---------+---------------+---------+-----------+----------+--------------+ PTV      Full                                                        +---------+---------------+---------+-----------+----------+--------------+ PERO     Full                                                        +---------+---------------+---------+-----------+----------+--------------+   +---------+---------------+---------+-----------+----------+-------------------+ LEFT     CompressibilityPhasicitySpontaneityPropertiesThrombus Aging       +---------+---------------+---------+-----------+----------+-------------------+ CFV      Full           Yes      Yes                                      +---------+---------------+---------+-----------+----------+-------------------+ SFJ      Full                                                             +---------+---------------+---------+-----------+----------+-------------------+ FV Prox  Full                                                             +---------+---------------+---------+-----------+----------+-------------------+ FV Mid                  Yes      Yes                  patent by color and                                                       Doppler             +---------+---------------+---------+-----------+----------+-------------------+ FV Distal               Yes      Yes                  patent by color and  Doppler             +---------+---------------+---------+-----------+----------+-------------------+ PFV      Full                                                             +---------+---------------+---------+-----------+----------+-------------------+ POP      Full           Yes      Yes                                      +---------+---------------+---------+-----------+----------+-------------------+ PTV      Full                                                             +---------+---------------+---------+-----------+----------+-------------------+ PERO     Full                                                             +---------+---------------+---------+-----------+----------+-------------------+     Summary: Right: Findings consistent with 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 Findings appear essentially unchanged compared to previous examination. Interstitial fluid noted in the calf Left:  There is no evidence of deep vein thrombosis in the lower extremity. Interstitial fluid noted in the calf  *See table(s) above for measurements and observations.    Preliminary     Chart has been reviewed    Assessment/Plan   73 y.o. male with medical history significant of myelofibrosis and significant splenomegaly COPD, subdural hematoma in 2019, seizure, hypertension hyperlipidemia PTSD right lower extremity DVT in 2018 again in 2019 status post IVC filter placed in 2019, melanoma,  Admitted for  Dyspnea and leg edema  Present on Admission: . Dyspnea -multifactorial patient with known history of significant anemia currently worse than his baseline will transfuse blood.  Although evidence of lower extremity edema chest x-ray show no evidence of CHF to suggest pulmonary fluid overload. Patient with known history of COPD denies currently wheezing he was given steroids in ER Continue with home medications and albuterol as needed Patient with known history of DVT status post IVC filter Will obtain CTA chest to evaluate for any possibility of IVC filter clotting and or lung abnormality resulting in dyspnea. Denies any chest pain for completion sake obtain troponin order echogram given lower extremity swelling. Rapid Covid is negative but given significant dyspnea will await results of Covid PCR if negative will discontinue airborne precautions  . Symptomatic anemia -we will transfuse 1 unit and follow-up CBC Hemoccult negative most likely secondary to underlying hematologic process Oncology is aware  . Leukocytosis -chronic felt to be secondary to myelofibrosis discussed by ER provider with oncology who thought this was very typical for myelofibrosis no evidence of acute process at the time  . Thrombocytopenia ([place]) -most likely secondary to  severe splenomegaly.  Continue to monitor patient want to follow-up as an outpatient with his primary oncologist  . Anemia of chronic disease chronic  currently anemic will transfuse 1 unit   . Bilateral leg edema -we will need to evaluate IVC filter to make sure there is no evidence of IVC clot formation as that would explain bilateral lower extremity edema.  Also obtain echogram to rule out right heart failure Patient is chronically on anticoagulation Dopplers in ER showed no evidence of DVT  . COPD (chronic obstructive pulmonary disease) ([facility]) -doubtful currently contributing to dyspnea significantly as patient denies any cough or significant wheezing will continue home medications and treat if needed  History of DVT continue Eliquis   Other plan as per orders.  DVT prophylaxis:  eliquis    Code Status:  FULL CODE   as per patient   I had personally discussed CODE STATUS with patient   Family Communication:   Family not at  Bedside    Disposition Plan:      To home once workup is complete and patient is stable                      Would benefit from PT/OT eval prior to DC  Ordered                                       Consults called:  Hematology oncology is aware please reconsult in a.m. if still have questions  Admission status:  ED Disposition    ED Disposition Condition [place]: [place] [[id number]]  Level of Care: Telemetry [5]  Admit to tele based on following criteria: Other see comments  Comments: dyspenea  Covid Evaluation: Symptomatic Person Under Investigation (PUI)  Diagnosis: Symptomatic anemia [[id number]]  Admitting Physician: Toy Baker [[id number]]  Attending Physician: Toy Baker [[id number]]  PT Class (Do Not Modify): Observation [104]  PT Acc Code (Do Not Modify): Observation [10022]       Obs      Level of care      tele  For   24H   Precautions:  Airborne and Contact precautions  PPE: Used by the provider:   P100  eye Goggles,  Gloves    Dhyan Noah 03/16/2019, 12:39 AM    [facility]     after 2 AM please page floor  coverage PA If 7AM-7PM, please contact the day team taking care of the patient using [email or web address]

## 2019-03-16 ENCOUNTER — Observation Stay (HOSPITAL_BASED_OUTPATIENT_CLINIC_OR_DEPARTMENT_OTHER): Payer: Medicare PPO

## 2019-03-16 DIAGNOSIS — Z806 Family history of leukemia: Secondary | ICD-10-CM | POA: Diagnosis not present

## 2019-03-16 DIAGNOSIS — D7581 Myelofibrosis: Secondary | ICD-10-CM | POA: Diagnosis present

## 2019-03-16 DIAGNOSIS — D649 Anemia, unspecified: Secondary | ICD-10-CM | POA: Diagnosis not present

## 2019-03-16 DIAGNOSIS — E039 Hypothyroidism, unspecified: Secondary | ICD-10-CM | POA: Diagnosis present

## 2019-03-16 DIAGNOSIS — I10 Essential (primary) hypertension: Secondary | ICD-10-CM | POA: Diagnosis present

## 2019-03-16 DIAGNOSIS — Z8669 Personal history of other diseases of the nervous system and sense organs: Secondary | ICD-10-CM | POA: Diagnosis not present

## 2019-03-16 DIAGNOSIS — J449 Chronic obstructive pulmonary disease, unspecified: Secondary | ICD-10-CM | POA: Diagnosis present

## 2019-03-16 DIAGNOSIS — D638 Anemia in other chronic diseases classified elsewhere: Secondary | ICD-10-CM | POA: Diagnosis present

## 2019-03-16 DIAGNOSIS — Z79890 Hormone replacement therapy (postmenopausal): Secondary | ICD-10-CM | POA: Diagnosis not present

## 2019-03-16 DIAGNOSIS — Z95828 Presence of other vascular implants and grafts: Secondary | ICD-10-CM | POA: Diagnosis not present

## 2019-03-16 DIAGNOSIS — D696 Thrombocytopenia, unspecified: Secondary | ICD-10-CM

## 2019-03-16 DIAGNOSIS — R0602 Shortness of breath: Secondary | ICD-10-CM

## 2019-03-16 DIAGNOSIS — Z86718 Personal history of other venous thrombosis and embolism: Secondary | ICD-10-CM | POA: Diagnosis not present

## 2019-03-16 DIAGNOSIS — R161 Splenomegaly, not elsewhere classified: Secondary | ICD-10-CM | POA: Diagnosis present

## 2019-03-16 DIAGNOSIS — Z885 Allergy status to narcotic agent status: Secondary | ICD-10-CM | POA: Diagnosis not present

## 2019-03-16 DIAGNOSIS — Z20828 Contact with and (suspected) exposure to other viral communicable diseases: Secondary | ICD-10-CM | POA: Diagnosis present

## 2019-03-16 DIAGNOSIS — Z85820 Personal history of malignant melanoma of skin: Secondary | ICD-10-CM | POA: Diagnosis not present

## 2019-03-16 DIAGNOSIS — E785 Hyperlipidemia, unspecified: Secondary | ICD-10-CM | POA: Diagnosis present

## 2019-03-16 DIAGNOSIS — Z79899 Other long term (current) drug therapy: Secondary | ICD-10-CM | POA: Diagnosis not present

## 2019-03-16 DIAGNOSIS — Z7901 Long term (current) use of anticoagulants: Secondary | ICD-10-CM | POA: Diagnosis not present

## 2019-03-16 DIAGNOSIS — R600 Localized edema: Secondary | ICD-10-CM | POA: Diagnosis present

## 2019-03-16 DIAGNOSIS — Z87891 Personal history of nicotine dependence: Secondary | ICD-10-CM | POA: Diagnosis not present

## 2019-03-16 DIAGNOSIS — D6959 Other secondary thrombocytopenia: Secondary | ICD-10-CM | POA: Diagnosis present

## 2019-03-16 LAB — CBC WITH DIFFERENTIAL/PLATELET
Abs Immature Granulocytes: 7.38 10*3/uL — ABNORMAL HIGH (ref 0.00–0.07)
Band Neutrophils: 2 %
Basophils Absolute: 2.1 10*3/uL — ABNORMAL HIGH (ref 0.0–0.1)
Basophils Relative: 13 %
Blasts: 7 %
Eosinophils Absolute: 1.6 10*3/uL — ABNORMAL HIGH (ref 0.0–0.5)
Eosinophils Relative: 12 %
HCT: 23.7 % — ABNORMAL LOW (ref 39.0–52.0)
Hemoglobin: 6.9 g/dL — CL (ref 13.0–17.0)
Immature Granulocytes: 24 %
Lymphocytes Relative: 14 %
Lymphs Abs: 5.2 10*3/uL — ABNORMAL HIGH (ref 0.7–4.0)
MCH: 29.4 pg (ref 26.0–34.0)
MCHC: 29.1 g/dL — ABNORMAL LOW (ref 30.0–36.0)
MCV: 100.9 fL — ABNORMAL HIGH (ref 80.0–100.0)
Metamyelocytes Relative: 2 %
Monocytes Absolute: 3.4 10*3/uL — ABNORMAL HIGH (ref 0.1–1.0)
Monocytes Relative: 2 %
Myelocytes: 14 %
Neutro Abs: 11.2 10*3/uL — ABNORMAL HIGH (ref 1.7–7.7)
Neutrophils Relative %: 34 %
Platelets: 57 10*3/uL — ABNORMAL LOW (ref 150–400)
RBC: 2.35 MIL/uL — ABNORMAL LOW (ref 4.22–5.81)
RDW: 21.8 % — ABNORMAL HIGH (ref 11.5–15.5)
WBC Morphology: INCREASED
WBC: 30.8 10*3/uL — ABNORMAL HIGH (ref 4.0–10.5)
nRBC: 48.4 % — ABNORMAL HIGH (ref 0.0–0.2)
nRBC: 54 /100 WBC — ABNORMAL HIGH

## 2019-03-16 LAB — IRON AND TIBC
Iron: 71 ug/dL (ref 45–182)
Saturation Ratios: 18 % (ref 17.9–39.5)
TIBC: 388 ug/dL (ref 250–450)
UIBC: 317 ug/dL

## 2019-03-16 LAB — COMPREHENSIVE METABOLIC PANEL
ALT: 21 U/L (ref 0–44)
AST: 16 U/L (ref 15–41)
Albumin: 3.7 g/dL (ref 3.5–5.0)
Alkaline Phosphatase: 91 U/L (ref 38–126)
Anion gap: 10 (ref 5–15)
BUN: 14 mg/dL (ref 8–23)
CO2: 22 mmol/L (ref 22–32)
Calcium: 8.6 mg/dL — ABNORMAL LOW (ref 8.9–10.3)
Chloride: 102 mmol/L (ref 98–111)
Creatinine, Ser: 0.94 mg/dL (ref 0.61–1.24)
GFR calc Af Amer: >60 mL/min (ref >60)
GFR calc non Af Amer: >60 mL/min (ref >60)
Glucose, Bld: 172 mg/dL — ABNORMAL HIGH (ref 70–99)
Potassium: 4.3 mmol/L (ref 3.5–5.1)
Sodium: 134 mmol/L — ABNORMAL LOW (ref 135–145)
Total Bilirubin: 1.1 mg/dL (ref 0.3–1.2)
Total Protein: 6.1 g/dL — ABNORMAL LOW (ref 6.5–8.1)

## 2019-03-16 LAB — ECHOCARDIOGRAM COMPLETE
Height: 68 in
Weight: 2447.99 oz

## 2019-03-16 LAB — TROPONIN I (HIGH SENSITIVITY)
Troponin I (High Sensitivity): 6 ng/L (ref <18)
Troponin I (High Sensitivity): 6 ng/L (ref <18)

## 2019-03-16 LAB — PHOSPHORUS: Phosphorus: 3.6 mg/dL (ref 2.5–4.6)

## 2019-03-16 LAB — VITAMIN B12: Vitamin B-12: 639 pg/mL (ref 180–914)

## 2019-03-16 LAB — CBC
HCT: 24.8 % — ABNORMAL LOW (ref 39.0–52.0)
Hemoglobin: 7.4 g/dL — ABNORMAL LOW (ref 13.0–17.0)
MCH: 30 pg (ref 26.0–34.0)
MCHC: 29.8 g/dL — ABNORMAL LOW (ref 30.0–36.0)
MCV: 100.4 fL — ABNORMAL HIGH (ref 80.0–100.0)
Platelets: 46 10*3/uL — ABNORMAL LOW (ref 150–400)
RBC: 2.47 MIL/uL — ABNORMAL LOW (ref 4.22–5.81)
RDW: 20.9 % — ABNORMAL HIGH (ref 11.5–15.5)
WBC: 27.7 10*3/uL — ABNORMAL HIGH (ref 4.0–10.5)
nRBC: 45.1 % — ABNORMAL HIGH (ref 0.0–0.2)

## 2019-03-16 LAB — FERRITIN: Ferritin: 137 ng/mL (ref 24–336)

## 2019-03-16 LAB — PATHOLOGIST SMEAR REVIEW

## 2019-03-16 LAB — TSH: TSH: 1.806 u[IU]/mL (ref 0.350–4.500)

## 2019-03-16 LAB — MAGNESIUM: Magnesium: 2.1 mg/dL (ref 1.7–2.4)

## 2019-03-16 LAB — SARS CORONAVIRUS 2 (TAT 6-24 HRS): SARS Coronavirus 2: NEGATIVE

## 2019-03-16 LAB — FOLATE: Folate: 7.5 ng/mL (ref >5.9)

## 2019-03-16 MED ORDER — RUXOLITINIB PHOSPHATE 10 MG PO TABS: 10.0000 mg | ORAL_TABLET | Freq: Two times a day (BID) | ORAL | Status: DC

## 2019-03-16 MED ORDER — IPRATROPIUM-ALBUTEROL 20-100 MCG/ACT IN AERS: 1.0000 | INHALATION_SPRAY | Freq: Four times a day (QID) | RESPIRATORY_TRACT | Status: DC | PRN

## 2019-03-16 MED ORDER — LEVOTHYROXINE SODIUM 50 MCG PO TABS
75.0000 ug | ORAL_TABLET | Freq: Every day | ORAL | Status: DC
  Administered 2019-03-16 – 2019-03-17 (×2): 75 ug via ORAL
  Filled 2019-03-16 (×2): qty 1

## 2019-03-16 MED ORDER — ONDANSETRON HCL 4 MG/2ML IJ SOLN: 4.0000 mg | Freq: Four times a day (QID) | INTRAMUSCULAR | Status: DC | PRN

## 2019-03-16 MED ORDER — SODIUM CHLORIDE 0.9% FLUSH
3.0000 mL | INTRAVENOUS | Status: DC | PRN
  Administered 2019-03-16: 3 mL via INTRAVENOUS
  Filled 2019-03-16 (×2): qty 3

## 2019-03-16 MED ORDER — FUROSEMIDE 10 MG/ML IJ SOLN
40.0000 mg | Freq: Two times a day (BID) | INTRAMUSCULAR | Status: DC
  Administered 2019-03-16 – 2019-03-17 (×3): 40 mg via INTRAVENOUS
  Filled 2019-03-16 (×3): qty 4

## 2019-03-16 MED ORDER — ALBUTEROL SULFATE HFA 108 (90 BASE) MCG/ACT IN AERS: 2.0000 | INHALATION_SPRAY | RESPIRATORY_TRACT | Status: DC | PRN

## 2019-03-16 MED ORDER — ACETAMINOPHEN 325 MG PO TABS: 650.0000 mg | ORAL_TABLET | Freq: Four times a day (QID) | ORAL | Status: DC | PRN

## 2019-03-16 MED ORDER — SIMVASTATIN 40 MG PO TABS
40.0000 mg | ORAL_TABLET | Freq: Every day | ORAL | Status: DC
  Administered 2019-03-16: 40 mg via ORAL
  Filled 2019-03-16: qty 1

## 2019-03-16 MED ORDER — MUSCLE RUB 10-15 % EX CREA
TOPICAL_CREAM | CUTANEOUS | Status: DC | PRN
  Administered 2019-03-16: 11:00:00 via TOPICAL
  Filled 2019-03-16: qty 85

## 2019-03-16 MED ORDER — IPRATROPIUM BROMIDE HFA 17 MCG/ACT IN AERS
2.0000 | INHALATION_SPRAY | Freq: Four times a day (QID) | RESPIRATORY_TRACT | Status: DC
  Administered 2019-03-16 – 2019-03-17 (×6): 2 via RESPIRATORY_TRACT

## 2019-03-16 MED ORDER — TRAZODONE HCL 50 MG PO TABS: 150.0000 mg | ORAL_TABLET | Freq: Every day | ORAL | Status: DC

## 2019-03-16 MED ORDER — ZOLPIDEM TARTRATE 5 MG PO TABS
5.0000 mg | ORAL_TABLET | Freq: Once | ORAL | Status: AC
  Administered 2019-03-16: 5 mg via ORAL
  Filled 2019-03-16: qty 1

## 2019-03-16 MED ORDER — ACETAMINOPHEN 650 MG RE SUPP: 650.0000 mg | Freq: Four times a day (QID) | RECTAL | Status: DC | PRN

## 2019-03-16 MED ORDER — SODIUM CHLORIDE 0.9 % IV SOLN: 250.0000 mL | INTRAVENOUS | Status: DC | PRN

## 2019-03-16 MED ORDER — SODIUM CHLORIDE 0.9% FLUSH
3.0000 mL | Freq: Two times a day (BID) | INTRAVENOUS | Status: DC
  Administered 2019-03-16 – 2019-03-17 (×4): 3 mL via INTRAVENOUS

## 2019-03-16 MED ORDER — ONDANSETRON HCL 4 MG PO TABS: 4.0000 mg | ORAL_TABLET | Freq: Four times a day (QID) | ORAL | Status: DC | PRN

## 2019-03-16 MED ORDER — SODIUM CHLORIDE 0.9% IV SOLUTION
Freq: Once | INTRAVENOUS | Status: AC
  Administered 2019-03-16: 04:00:00 via INTRAVENOUS

## 2019-03-16 MED ORDER — APIXABAN 2.5 MG PO TABS
2.5000 mg | ORAL_TABLET | Freq: Two times a day (BID) | ORAL | Status: DC
  Administered 2019-03-16 – 2019-03-17 (×4): 2.5 mg via ORAL
  Filled 2019-03-16 (×4): qty 1

## 2019-03-16 MED ORDER — HYDROCODONE-ACETAMINOPHEN 5-325 MG PO TABS: 1.0000 | ORAL_TABLET | ORAL | Status: DC | PRN

## 2019-03-16 MED ORDER — PRAZOSIN HCL 1 MG PO CAPS
2.0000 mg | ORAL_CAPSULE | Freq: Every day | ORAL | Status: DC
  Administered 2019-03-16: 2 mg via ORAL
  Filled 2019-03-16: qty 2

## 2019-03-16 NOTE — Progress Notes (Addendum)
PROGRESS NOTE                                                                                                                                                                                                             Patient Demographics:    Carlos Vaughn, is a 73 y.o. 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  Reports his Eliquis dose was recently reduced by his PCP but has not missed any dose. Patient found to have drop in hemoglobin to 6.8, stool for Hemoccult was negative. Admitted for further management.   Subjective:   Patient reports breathing to be slightly better.  Received 1 unit PRBC.   Assessment  & Plan :   Principal problem Shortness of breath (HCC) Differential includes symptomatic anemia vs Cor pulmonale.  COVID-19 testing pending (POC was negative).  Maintain airborne and droplet isolation for now.  Received 1 unit PRBC with improvement in hemoglobin.  Still reports being unable to lie down flat.  Continue IV Lasix 40 mg every 12 hours.  Strict I's/O and daily weight.  Check 2D echo.    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  CT of the abdomen does not comment on displaced IVC filter.  Follow 2D echo.   Continue Eliquis   Pancytopenia with myelofibrosis and massive splenomegaly Has significant leukocytosis, anemia and thrombocytopenia secondary to myelofibrosis.  Discussed with hematology on-call Dr. Alvy Bimler who recommends transfusing as needed and follow-up with his hematologist at [facility].     COPD (chronic obstructive pulmonary disease) (HCC) Stable.  Continue home inhaler.  Hypothyroidism Continue Synthroid     Patient status: Inpatient Patient presenting with symptomatic anemia and  shortness of breath with leg edema suspicious for right heart failure.  He needs to be monitored closely in an inpatient setting with IV diuresis and possible multiple transfusion.  He is at high risk for clinical decompensation for his respiratory symptoms and severe symptomatic anemia.  For this he needs to be monitored in an inpatient setting for at least >2 midnights  Code Status : full code  Family Communication  : None  Disposition Plan  : Home once respiratory symptoms improved  Barriers For Discharge : Active symptoms  Consults  : None (spoke on the phone with Dr. Alvy Bimler)  Procedures  : CT abdomen, Doppler lower extremity, pending 2D echo  DVT Prophylaxis  : Eliquis  Lab Results  Component Value Date   PLT 46 (L) 03/16/2019    Antibiotics  :    Anti-infectives (From admission, onward)   None        Objective:   Vitals:   03/16/19 0241 03/16/19 0330 03/16/19 0430 03/16/19 0913  BP:  118/63 (!) 130/57   Pulse:  78 70   Resp:  20 18   Temp: 98 F (36.7 C) 98.4 F (36.9 C) 98.5 F (36.9 C)   TempSrc: Oral Oral    SpO2:  100% 100% 99%  Weight: 69.4 kg     Height: 5\' 8"  (1.727 m)       Wt Readings from Last 3 Encounters:  03/16/19 69.4 kg  12/26/17 70.3 kg  09/05/17 67.6 kg     Intake/Output Summary (Last 24 hours) at 03/16/2019 1200 Last data filed at 03/16/2019 1139 Gross per 24 hour  Intake 340 ml  Output 1025 ml  Net -685 ml     Physical Exam  Gen: not  in distress HEENT: Pallor present, moist mucosa, supple neck Chest: Diminished bibasilar breath sounds CVS: N S1&S2, no murmurs, rubs or gallop GI: soft, distended abdomen, palpable spleen, nontender Musculoskeletal: warm, 1+ pitting edema bilaterally     Data Review:    CBC Recent Labs  Lab 03/15/19 1832 03/16/19 0600  WBC 30.8* 27.7*  HGB 6.9* 7.4*  HCT 23.7* 24.8*  PLT 57* 46*  MCV 100.9* 100.4*  MCH 29.4 30.0  MCHC 29.1* 29.8*  RDW 21.8* 20.9*  LYMPHSABS 5.2*  --   MONOABS 3.4*  --   EOSABS 1.6*  --   BASOSABS 2.1*  --     Chemistries  Recent Labs  Lab 03/15/19 1832 03/16/19 0600  NA 136 134*  K 4.4 4.3  CL 104 102  CO2 24 22  GLUCOSE 94 172*  BUN 17 14  CREATININE 1.13 0.94  CALCIUM 8.7* 8.6*  MG  --  2.1  AST 19 16  ALT 24 21  ALKPHOS 104 91  BILITOT 1.3* 1.1   ------------------------------------------------------------------------------------------------------------------ No results for input(s): CHOL, HDL, LDLCALC, TRIG, CHOLHDL, LDLDIRECT in the last 72 hours.  No results found for: HGBA1C ------------------------------------------------------------------------------------------------------------------ Recent Labs    03/16/19 0600  TSH 1.806   ------------------------------------------------------------------------------------------------------------------ Recent Labs    03/15/19 2310 03/15/19 2311  VITAMINB12  --  639  FOLATE  --  7.5  FERRITIN  --  137  TIBC  --  388  IRON  --  71  RETICCTPCT 6.8*  --     Coagulation profile No results for input(s): INR, PROTIME in the last 168 hours.  No results for input(s): DDIMER in the last 72 hours.  Cardiac Enzymes No results for input(s): CKMB, TROPONINI, MYOGLOBIN in the last 168 hours.  Invalid input(s): CK ------------------------------------------------------------------------------------------------------------------    Component Value Date/Time   BNP 148.2 (H) 03/15/2019  1832    Inpatient Medications  Scheduled Meds:  apixaban  2.5 mg Oral BID   furosemide  40 mg Intravenous Q12H   ipratropium  2 puff Inhalation Q6H   levothyroxine  75 mcg Oral QAC breakfast   prazosin  2 mg Oral QHS   simvastatin  40 mg Oral QHS   sodium chloride flush  3 mL Intravenous Q12H   traZODone  150 mg Oral QHS   [START ON 03/17/2019] traZODone  150 mg Oral QHS   Continuous Infusions:  sodium chloride     PRN Meds:.sodium chloride, acetaminophen **OR** acetaminophen, albuterol, HYDROcodone-acetaminophen, Ipratropium-Albuterol, Muscle Rub, ondansetron **OR** ondansetron (ZOFRAN) IV, sodium chloride flush  Micro Results No results found for this or any previous visit (from the past 240 hour(s)).  Radiology Reports Dg Chest 2 View  Result Date: 03/15/2019 CLINICAL DATA:  Shortness of breath EXAM: CHEST - 2 VIEW COMPARISON:  12/26/2017 FINDINGS: The heart size and mediastinal contours are within normal limits. Both lungs are clear. The visualized skeletal structures are unremarkable. IMPRESSION: No active cardiopulmonary disease. Electronically Signed   By: Constance Holster M.D.   On: 03/15/2019 17:58   Ct Chest W Contrast  Result Date: 03/16/2019 CLINICAL DATA:  Shortness of breath and bilateral leg swelling, history of COPD, pneumonia, CHF, myelofibrosis disorder and melanoma EXAM: CT CHEST, ABDOMEN, AND PELVIS WITH CONTRAST TECHNIQUE: Multidetector CT imaging of the chest, abdomen and pelvis was performed following the standard protocol during bolus administration of intravenous contrast. CONTRAST:  123mL OMNIPAQUE IOHEXOL 300 MG/ML  SOLN COMPARISON:  CTA chest 06/17/2017 FINDINGS: CT CHEST FINDINGS Cardiovascular: Cardiac size at the upper limits of normal. Calcifications of the mitral valve annulus and aortic leaflets are noted. Atherosclerotic calcifications of coronary arteries are present. No pericardial effusion. Normal caliber thoracic aorta with heavy  calcification predominantly within the aortic arch. Normal 3 vessel branching of the arch some luminal narrowing of the proximal left subclavian artery just distal to the origin secondary to calcified and noncalcified atheromatous plaque. The central pulmonary arteries are borderline enlarged. No large central pulmonary arterial filling defect is seen however on this non tailored examination. Mediastinum/Nodes: No enlarged mediastinal, hilar, or axillary lymph nodes. Thyroid gland, trachea, and esophagus demonstrate no significant findings. Lungs/Pleura: Bandlike region of scarring and/or atelectasis with bronchiectatic change along the right major fissure. Additional bandlike subsegmental atelectasis present in the right lung base. No consolidation, features of edema, pneumothorax, or effusion. No suspicious pulmonary nodules or masses. Musculoskeletal: Diffusely sclerotic appearance of the bones. Exaggerated thoracic kyphosis and levocurvature of the thoracic spine apex at T8. Mild anterior wedging at T8, is remote and unchanged from comparison. No definite acute fracture or compression deformity. Multilevel facet degenerative changes and milder discogenic changes throughout the thoracic spine. CT ABDOMEN PELVIS FINDINGS Hepatobiliary: No focal liver abnormality is seen. No gallstones, gallbladder wall thickening, or biliary dilatation. Pancreas: Unremarkable. No pancreatic ductal dilatation or surrounding inflammatory changes. Spleen: Massive splenomegaly with the spleen extending from the left upper quadrant to the pelvis slightly crossing lobe midline. No focal splenic lesion is seen. Adrenals/Urinary Tract: Displacement of the left kidney and adrenal gland towards midline secondary to the massive splenomegaly. Kidneys enhance and excrete symmetrically. No visible or contour deforming renal lesions. No urolithiasis or hydronephrosis. Urinary bladder is unremarkable. Stomach/Bowel: Distal esophagus, stomach and  duodenal sweep are unremarkable. No small bowel wall thickening or dilatation. No  evidence of obstruction. A normal appendix is visualized. No colonic dilatation or wall thickening. Interposition of the splenic flexure anterior to the massively enlarged spleen. Vascular/Lymphatic: Engorged splenic vein and mild collateralization. Vasculature is otherwise unremarkable. Atherosclerotic plaque within the normal caliber aorta. Extensive calcification the branch vessels as well. Reproductive: The prostate and seminal vesicles are unremarkable. Other: No abdominopelvic free fluid or free gas. No bowel containing hernias. Musculoskeletal: Extensive sclerotic changes of the bones throughout the abdomen and pelvis. No acute osseous abnormality. Multilevel degenerative changes are present in the imaged portions of the spine. Compensatory dextrocurvature of the thoracolumbar junction centered at L2. IMPRESSION: 1. Massive splenomegaly with extensive sclerotic changes of the bones throughout chest, abdomen and pelvis likely reflective of patient's reported history of myelofibrosis. 2. Aortic atherosclerosis. Coronary artery calcifications. 3. Borderline enlarged central pulmonary arteries, which can be seen with pulmonary arterial hypertension. 4. Bandlike region of scarring and/or atelectasis with bronchiectatic change along the right major fissure. Additional bandlike subsegmental atelectasis present in the right lung base. 5. Aortic Atherosclerosis (ICD10-I70.0). 6. At least mild atheromatous narrowing of the proximal left subclavian artery. Electronically Signed   By: Lovena Le M.D.   On: 03/16/2019 00:44   Ct Abdomen Pelvis W Contrast  Result Date: 03/16/2019 CLINICAL DATA:  Shortness of breath and bilateral leg swelling, history of COPD, pneumonia, CHF, myelofibrosis disorder and melanoma EXAM: CT CHEST, ABDOMEN, AND PELVIS WITH CONTRAST TECHNIQUE: Multidetector CT imaging of the chest, abdomen and pelvis was  performed following the standard protocol during bolus administration of intravenous contrast. CONTRAST:  145mL OMNIPAQUE IOHEXOL 300 MG/ML  SOLN COMPARISON:  CTA chest 06/17/2017 FINDINGS: CT CHEST FINDINGS Cardiovascular: Cardiac size at the upper limits of normal. Calcifications of the mitral valve annulus and aortic leaflets are noted. Atherosclerotic calcifications of coronary arteries are present. No pericardial effusion. Normal caliber thoracic aorta with heavy calcification predominantly within the aortic arch. Normal 3 vessel branching of the arch some luminal narrowing of the proximal left subclavian artery just distal to the origin secondary to calcified and noncalcified atheromatous plaque. The central pulmonary arteries are borderline enlarged. No large central pulmonary arterial filling defect is seen however on this non tailored examination. Mediastinum/Nodes: No enlarged mediastinal, hilar, or axillary lymph nodes. Thyroid gland, trachea, and esophagus demonstrate no significant findings. Lungs/Pleura: Bandlike region of scarring and/or atelectasis with bronchiectatic change along the right major fissure. Additional bandlike subsegmental atelectasis present in the right lung base. No consolidation, features of edema, pneumothorax, or effusion. No suspicious pulmonary nodules or masses. Musculoskeletal: Diffusely sclerotic appearance of the bones. Exaggerated thoracic kyphosis and levocurvature of the thoracic spine apex at T8. Mild anterior wedging at T8, is remote and unchanged from comparison. No definite acute fracture or compression deformity. Multilevel facet degenerative changes and milder discogenic changes throughout the thoracic spine. CT ABDOMEN PELVIS FINDINGS Hepatobiliary: No focal liver abnormality is seen. No gallstones, gallbladder wall thickening, or biliary dilatation. Pancreas: Unremarkable. No pancreatic ductal dilatation or surrounding inflammatory changes. Spleen: Massive  splenomegaly with the spleen extending from the left upper quadrant to the pelvis slightly crossing lobe midline. No focal splenic lesion is seen. Adrenals/Urinary Tract: Displacement of the left kidney and adrenal gland towards midline secondary to the massive splenomegaly. Kidneys enhance and excrete symmetrically. No visible or contour deforming renal lesions. No urolithiasis or hydronephrosis. Urinary bladder is unremarkable. Stomach/Bowel: Distal esophagus, stomach and duodenal sweep are unremarkable. No small bowel wall thickening or dilatation. No evidence of obstruction. A normal appendix is visualized.  No colonic dilatation or wall thickening. Interposition of the splenic flexure anterior to the massively enlarged spleen. Vascular/Lymphatic: Engorged splenic vein and mild collateralization. Vasculature is otherwise unremarkable. Atherosclerotic plaque within the normal caliber aorta. Extensive calcification the branch vessels as well. Reproductive: The prostate and seminal vesicles are unremarkable. Other: No abdominopelvic free fluid or free gas. No bowel containing hernias. Musculoskeletal: Extensive sclerotic changes of the bones throughout the abdomen and pelvis. No acute osseous abnormality. Multilevel degenerative changes are present in the imaged portions of the spine. Compensatory dextrocurvature of the thoracolumbar junction centered at L2. IMPRESSION: 1. Massive splenomegaly with extensive sclerotic changes of the bones throughout chest, abdomen and pelvis likely reflective of patient's reported history of myelofibrosis. 2. Aortic atherosclerosis. Coronary artery calcifications. 3. Borderline enlarged central pulmonary arteries, which can be seen with pulmonary arterial hypertension. 4. Bandlike region of scarring and/or atelectasis with bronchiectatic change along the right major fissure. Additional bandlike subsegmental atelectasis present in the right lung base. 5. Aortic Atherosclerosis  (ICD10-I70.0). 6. At least mild atheromatous narrowing of the proximal left subclavian artery. Electronically Signed   By: Lovena Le M.D.   On: 03/16/2019 00:44   Vas [place] Lower Extremity Venous (dvt) (only [facility])  Result Date: 03/15/2019  Lower Venous Study Indications: SOB, and Edema.  Risk Factors: COPD. Comparison Study: Prior study from 04/20/17 is available for comparison Performing Technologist: Sharion Dove RVS  Examination Guidelines: A complete evaluation includes B-mode imaging, spectral Doppler, color Doppler, and power Doppler as needed of all accessible portions of each vessel. Bilateral testing is considered an integral part of a complete examination. Limited examinations for reoccurring indications may be performed as noted.  +---------+---------------+---------+-----------+----------+--------------+  RIGHT     Compressibility Phasicity Spontaneity Properties Thrombus Aging  +---------+---------------+---------+-----------+----------+--------------+  CFV       Full                                                             +---------+---------------+---------+-----------+----------+--------------+  SFJ       Full                                                             +---------+---------------+---------+-----------+----------+--------------+  FV Prox   Partial                                          Chronic         +---------+---------------+---------+-----------+----------+--------------+  FV Mid                    Yes       Yes                    Chronic         +---------+---------------+---------+-----------+----------+--------------+  FV Distal                 Yes       Yes  Chronic         +---------+---------------+---------+-----------+----------+--------------+  PFV       Full                                                             +---------+---------------+---------+-----------+----------+--------------+  POP       Partial         Yes       Yes                     Chronic         +---------+---------------+---------+-----------+----------+--------------+  PTV       Full                                                             +---------+---------------+---------+-----------+----------+--------------+  PERO      Full                                                             +---------+---------------+---------+-----------+----------+--------------+   +---------+---------------+---------+-----------+----------+-------------------+  LEFT      Compressibility Phasicity Spontaneity Properties Thrombus Aging       +---------+---------------+---------+-----------+----------+-------------------+  CFV       Full            Yes       Yes                                         +---------+---------------+---------+-----------+----------+-------------------+  SFJ       Full                                                                  +---------+---------------+---------+-----------+----------+-------------------+  FV Prox   Full                                                                  +---------+---------------+---------+-----------+----------+-------------------+  FV Mid                    Yes       Yes                    patent by color and  Doppler              +---------+---------------+---------+-----------+----------+-------------------+  FV Distal                 Yes       Yes                    patent by color and                                                              Doppler              +---------+---------------+---------+-----------+----------+-------------------+  PFV       Full                                                                  +---------+---------------+---------+-----------+----------+-------------------+  POP       Full            Yes       Yes                                         +---------+---------------+---------+-----------+----------+-------------------+   PTV       Full                                                                  +---------+---------------+---------+-----------+----------+-------------------+  PERO      Full                                                                  +---------+---------------+---------+-----------+----------+-------------------+     Summary: Right: Findings consistent with 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 Findings appear essentially unchanged compared to previous examination. Interstitial fluid noted in the calf Left: There is no evidence of deep vein thrombosis in the lower extremity. Interstitial fluid noted in the calf  *See table(s) above for measurements and observations.    Preliminary     Time Spent in minutes  25   Otniel Hoe M.D on 03/16/2019 at 12:00 PM  Between 7am to 7pm - Pager - [phone]  After 7pm go to [email or web address] - password [facility]  [facility] -  Office  [phone]

## 2019-03-16 NOTE — Progress Notes (Signed)
  Echocardiogram 2D Echocardiogram has been performed.  Carlos Vaughn 03/16/2019, 11:52 AM

## 2019-03-16 NOTE — Evaluation (Addendum)
Physical Therapy Evaluation Patient Details Name: Carlos Vaughn MRN: [id number] DOB: [birth year] Today's Date: 03/16/2019   History of Present Illness  73 year old man admitted for R DVT.  PMH:  SDH, COPD  Clinical Impression  Carlos Vaughn is 73 y.o. male admitted with above HPI and diagnosis. Patient lives with his wife and adopted grandchild and is independent at baseline. He is currently functioning at independent level for all mobility and denies falls in the last year. Patient denied dyspnea on exertion with functional strength and balance testing. Patient does not require skilled PT at this time, will sign off. Please re-consult if there is a change in functional status.    Follow Up Recommendations No PT follow up    Equipment Recommendations  None recommended by PT    Recommendations for Other Services       Precautions / Restrictions Precautions Precautions: None Restrictions Weight Bearing Restrictions: No      Mobility  Bed Mobility Overal bed mobility: Independent             General bed mobility comments: pt OOB at start of session  Transfers Overall transfer level: Independent               General transfer comment: pt performed sit<>stand with no difficulty. pt performed 5x Sit<>Stand test without UE use to rise (16.8 seconds)  Ambulation/Gait Ambulation/Gait assistance: Modified independent (Device/Increase time)     Gait Pattern/deviations: WFL(Within Functional Limits) Gait velocity: WNL's      Stairs            Wheelchair Mobility    Modified Rankin (Stroke Patients Only)       Balance Overall balance assessment: Independent   Sitting balance-Leahy Scale: Normal       Standing balance-Leahy Scale: Good   Single Leg Stance - Right Leg: 11 Single Leg Stance - Left Leg: 8 Tandem Stance - Right Leg: 20 Tandem Stance - Left Leg: 20 Rhomberg - Eyes Opened: 30(therapist stopped pt at 30) Rhomberg - Eyes Closed:  20(therapist stopped pt at 20)                 Pertinent Vitals/Pain Pain Assessment: No/denies pain Faces Pain Scale: No hurt    Home Living Family/patient expects to be discharged to:: Private residence Living Arrangements: Spouse/significant other;Children;Other relatives Available Help at Discharge: Family;Available 24 hours/day Type of Home: House Home Access: Stairs to enter Entrance Stairs-Rails: Psychiatric nurse of Steps: 4 Home Layout: Multi-level Home Equipment: None Additional Comments: Pt could not hear me with mask on and negative pressure fan on; stated his wife wanted to come in and see him.  Last year's info states he was in a multi level home, with walk in shower and standard commode    Prior Function Level of Independence: Independent         Comments: Pt is retired and is independent for asll ADL's and mobility. He still drives, and enjoys Research officer, trade union and work around [employer].     Hand Dominance   Dominant Hand: Right    Extremity/Trunk Assessment   Upper Extremity Assessment Upper Extremity Assessment: Overall WFL for tasks assessed    Lower Extremity Assessment Lower Extremity Assessment: Overall WFL for tasks assessed    Cervical / Trunk Assessment Cervical / Trunk Assessment: Normal  Communication   Communication: HOH  Cognition Arousal/Alertness: Awake/alert Behavior During Therapy: WFL for tasks assessed/performed Overall Cognitive Status: Within Functional Limits for tasks assessed  General Comments: appears wfls; pt hoh, therapist had N95 and negative pressure fan was on making hearing more challenging for patient      General Comments General comments (skin integrity, edema, etc.): pt denies dyspnea on exertion; pt does complain of dyspnea with mask on and frequently holding mask away from his face    Exercises     Assessment/Plan    PT Assessment Patent does not need any further PT services  PT  Problem List         PT Treatment Interventions      PT Goals (Current goals can be found in the Care Plan section)  Acute Rehab PT Goals Patient Stated Goal: pt wants to return home PT Goal Formulation: With patient Time For Goal Achievement: 03/30/19 Potential to Achieve Goals: Good    Frequency  1x Eval/treat    AM-PAC PT "6 Clicks" Mobility  Outcome Measure Help needed turning from your back to your side while in a flat bed without using bedrails?: None Help needed moving from lying on your back to sitting on the side of a flat bed without using bedrails?: None Help needed moving to and from a bed to a chair (including a wheelchair)?: None Help needed standing up from a chair using your arms (e.g., wheelchair or bedside chair)?: None Help needed to walk in hospital room?: None Help needed climbing 3-5 steps with a railing? : None 6 Click Score: 24    End of Session Equipment Utilized During Treatment: Gait belt Activity Tolerance: Patient tolerated treatment well Patient left: in chair;with call bell/phone within reach Nurse Communication: Mobility status PT Visit Diagnosis: Difficulty in walking, not elsewhere classified (R26.2)    Time: [id number] PT Time Calculation (min) (ACUTE ONLY): 14 min   Charges:   PT Evaluation $PT Eval Low Complexity: 1 Low          Kipp Brood, PT, DPT Physical Therapist with [facility]  03/16/2019 1:38 PM

## 2019-03-16 NOTE — Evaluation (Signed)
Occupational Therapy Evaluation Patient Details Name: Carlos Vaughn MRN: [id number] DOB: 10-Feb-1946 Today's Date: 03/16/2019    History of Present Illness 73 year old man admitted for R DVT.  PMH:  SDH, COPD   Clinical Impression   Pt was admitted for the above.  He had difficulty hearing me, but states he was independent at home.  Pt was independent/mod I for adls/toileting (extra time for socks).  No further OT is needed at this time    Follow Up Recommendations  No OT follow up    Equipment Recommendations  None recommended by OT    Recommendations for Other Services       Precautions / Restrictions Precautions Precautions: None Restrictions Weight Bearing Restrictions: No      Mobility Bed Mobility Overal bed mobility: Independent                Transfers Overall transfer level: Independent                    Balance                                           ADL either performed or assessed with clinical judgement   ADL Overall ADL's : Modified independent                      independent with toileting                 General ADL Comments: pt was able to cross legs for socks, extra time. Educated on pacing himself for energy conservation     Vision         Perception     Praxis      Pertinent Vitals/Pain Pain Assessment: Faces Faces Pain Scale: No hurt     Hand Dominance     Extremity/Trunk Assessment Upper Extremity Assessment Upper Extremity Assessment: Overall WFL for tasks assessed           Communication Communication Communication: HOH   Cognition Arousal/Alertness: Awake/alert Behavior During Therapy: WFL for tasks assessed/performed                                   General Comments: appears wfls; pt hoh, therapist had N95 and negative pressure fan was on   General Comments  sats 100%; dyspnea 2/4 with activity    Exercises     Shoulder Instructions       Home Living Family/patient expects to be discharged to:: Private residence Living Arrangements: Spouse/significant other;Children;Other relatives                               Additional Comments: Pt could not hear me with mask on and negative pressure fan on; stated his wife wanted to come in and see him.  Last year's info states he was in a multi level home, with walk in shower and standard commode      Prior Functioning/Environment Level of Independence: Independent                 OT Problem List:        OT Treatment/Interventions:      OT Goals(Current goals can be found in the care plan section) Acute  Rehab OT Goals Patient Stated Goal: asking for COVID results; wants wife to come in OT Goal Formulation: All assessment and education complete, DC therapy  OT Frequency:     Barriers to D/C:            Co-evaluation              AM-PAC OT "6 Clicks" Daily Activity     Outcome Measure Help from another person eating meals?: None Help from another person taking care of personal grooming?: None Help from another person toileting, which includes using toliet, bedpan, or urinal?: None Help from another person bathing (including washing, rinsing, drying)?: None Help from another person to put on and taking off regular upper body clothing?: None Help from another person to put on and taking off regular lower body clothing?: None 6 Click Score: 24   End of Session    Activity Tolerance: Patient tolerated treatment well Patient left: in chair;with call bell/phone within reach  OT Visit Diagnosis: Muscle weakness (generalized) (M62.81)                Time: [id number] OT Time Calculation (min): 18 min Charges:  OT General Charges $OT Visit: 1 Visit OT Evaluation $OT Eval Low Complexity: [place], OTR/L Acute Rehabilitation Services [phone] [facility] pager [phone] office 03/16/2019  [place] 03/16/2019, 11:17 AM

## 2019-03-16 NOTE — Progress Notes (Signed)
Brief note: Ruxolitinib Shanon Brow)  Per [facility] policy if any of the following occurs, Shanon Brow will be held:   Ruxolitinib [facility]) hold criteria  ANC < 0.5  Pltc < 50K  CrCl < 60 mL/min AND Pltc < 100K  CrCl < 15 mL/min AND not on dialysis  Any LFT > ULN AND Pltc < 100K  Active infection   11/27: CrCl~55 ml/min with plts = 57 This medication will be held (d/c'd off MAR)  Thanks Lawana Pai R 03/16/2019 3:08 AM

## 2019-03-17 DIAGNOSIS — R600 Localized edema: Secondary | ICD-10-CM | POA: Diagnosis present

## 2019-03-17 LAB — CBC
HCT: 21 % — ABNORMAL LOW (ref 39.0–52.0)
Hemoglobin: 6.3 g/dL — CL (ref 13.0–17.0)
MCH: 29.4 pg (ref 26.0–34.0)
MCHC: 30 g/dL (ref 30.0–36.0)
MCV: 98.1 fL (ref 80.0–100.0)
Platelets: 32 10*3/uL — ABNORMAL LOW (ref 150–400)
RBC: 2.14 MIL/uL — ABNORMAL LOW (ref 4.22–5.81)
RDW: 20.3 % — ABNORMAL HIGH (ref 11.5–15.5)
WBC: 21.2 10*3/uL — ABNORMAL HIGH (ref 4.0–10.5)
nRBC: 44.2 % — ABNORMAL HIGH (ref 0.0–0.2)

## 2019-03-17 LAB — PREPARE RBC (CROSSMATCH)

## 2019-03-17 MED ORDER — FUROSEMIDE 40 MG PO TABS: 40.0000 mg | ORAL_TABLET | Freq: Every day | ORAL | 0 refills | Status: DC | PRN

## 2019-03-17 MED ORDER — SODIUM CHLORIDE 0.9% IV SOLUTION: Freq: Once | INTRAVENOUS | Status: DC

## 2019-03-17 NOTE — Progress Notes (Signed)
1 unit of PRBC given with [id number]; verified with Tori,RN; no signs of distress noted; VS stable; will continue to monitor pt;

## 2019-03-17 NOTE — Discharge Summary (Signed)
Physician Discharge Summary  Carlos Vaughn IRC:[id number] DOB: [birth year] DOA: 03/15/2019  PCP: Caralyn Guile, DO  Admit date: 03/15/2019 Discharge date: 03/17/2019   Admitted From: Home Disposition: Home  Recommendations for Outpatient Follow-up:  1. Follow up with PCP in 1-2 weeks 2. Patient has scheduled appointment with his hematologist on 12/3.  Needs H&H checked during the follow-up.  Home Health: None Equipment/Devices: None  Discharge Condition: Fair CODE STATUS: Full code Diet recommendation: Regular    Discharge Diagnoses:  Principal Problem:   Shortness of breath  Active Problems:   Anticoagulated for Rt Leg DVT   Seizures (HCC)   Leukocytosis   Thrombocytopenia (HCC)   Anemia of chronic disease   Symptomatic anemia   Bilateral lower extremity edema   COPD (chronic obstructive pulmonary disease) (HCC)   Myelofibrosis (HCC)   Bilateral leg edema   Brief narrative/HPI 73 year old male with myelofibrosis with significant splenomegaly, COPD, history of subdural hematoma in 2019, seizures, hypertension, hyperlipidemia, PTSD and 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  Reports his Eliquis dose was recently reduced by his PCP but has not missed any dose. Patient found to have drop in hemoglobin to 6.8, stool for Hemoccult was negative. Admitted for further management.  Hospital course   Principal problem Shortness of breath [facility]) Likely due to symptomatic anemia. COVID-19 testing negative. Received 1 unit PRBC with improvement in hemoglobin to 7.4, dropped again to 6.3 this morning.  Received second unit this morning. Dyspnea and leg swelling has markedly improved with IV Lasix.  I will prescribe him oral Lasix 40 mg daily as needed for dyspnea and leg swellings. 2D echo with normal EF, grade 1 diastolic  dysfunction and normal right ventricular failure or pulmonary hypertension.  Does not want to stay further in the hospital or get repeat H&H.  Feels better and wants to go home.  I think he is stable to be discharged home.  Needs close monitoring of his labs as outpatient.   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  CT of the abdomen does not comment on displaced IVC filter.    2D echo unremarkable. Continue Eliquis.   Pancytopenia with myelofibrosis and massive splenomegaly Has significant leukocytosis, anemia and thrombocytopenia secondary to myelofibrosis.  Discussed with hematology on-call Dr. Alvy Bimler who recommends transfusing as needed and follow-up with his hematologist at [facility]. Patient given 2 unit PRBC.  He did not want to wait for repeat H&H.  Has low platelets as well.  Eliquis has been continued at low-dose.  Ruxolitinib should be continued.  Follow repeat lab during his visit with hematologist next week.     COPD (chronic obstructive pulmonary disease) (HCC) Stable.  Continue home inhaler.  No signs of cor pulmonale on echo.  Hypothyroidism Continue Synthroid  Family communication: Spoke with wife on the phone Consult: Spoke with hematologist on-call (Dr. Burke Keels) Procedure: CT abdomen,  Doppler lower extremity, 2D echo  Discharge Instructions   Allergies as of 03/17/2019      Reactions   Oxycodone Itching      Medication List    TAKE these medications   apixaban 2.5 MG Tabs tablet Commonly known as: ELIQUIS Take 2.5 mg by mouth 2 (two) times daily.   Combivent Respimat 20-100 MCG/ACT Aers respimat Generic drug: Ipratropium-Albuterol Inhale 1 puff into  the lungs every 6 (six) hours as needed for wheezing or shortness of breath.   furosemide 40 MG tablet Commonly known as: Lasix Take 1 tablet (40 mg total) by mouth daily as needed for fluid.   levothyroxine 75 MCG tablet Commonly known  as: SYNTHROID Take 75 mcg by mouth daily before breakfast.   multivitamin with minerals Tabs tablet Take 1 tablet by mouth daily.   prazosin 2 MG capsule Commonly known as: MINIPRESS Take 2 mg by mouth at bedtime.   ruxolitinib phosphate 10 MG tablet Commonly known as: JAKAFI Take 10-15 mg by mouth 2 (two) times daily. Take 66m in am and 167min pm.   simvastatin 40 MG tablet Commonly known as: ZOCOR Take 40 mg by mouth at bedtime.   traZODone 150 MG tablet Commonly known as: DESYREL Take 150 mg by mouth at bedtime.      Follow-up Information    PiCaralyn GuileDO. Schedule an appointment as soon as possible for a visit in 2 week(s).   Specialty: Family Medicine Contact information: [address] [place] [phone]        Allergies  Allergen Reactions  . Oxycodone Itching     Procedures/Studies: Dg Chest 2 View  Result Date: 03/15/2019 CLINICAL DATA:  Shortness of breath EXAM: CHEST - 2 VIEW COMPARISON:  12/26/2017 FINDINGS: The heart size and mediastinal contours are within normal limits. Both lungs are clear. The visualized skeletal structures are unremarkable. IMPRESSION: No active cardiopulmonary disease. Electronically Signed   By: ChConstance Holster.D.   On: 03/15/2019 17:58   Ct Chest W Contrast  Result Date: 03/16/2019 CLINICAL DATA:  Shortness of breath and bilateral leg swelling, history of COPD, pneumonia, CHF, myelofibrosis disorder and melanoma EXAM: CT CHEST, ABDOMEN, AND PELVIS WITH CONTRAST TECHNIQUE: Multidetector CT imaging of the chest, abdomen and pelvis was performed following the standard protocol during bolus administration of intravenous contrast. CONTRAST:  10059mMNIPAQUE IOHEXOL 300 MG/ML  SOLN COMPARISON:  CTA chest 06/17/2017 FINDINGS: CT CHEST FINDINGS Cardiovascular: Cardiac size at the upper limits of normal. Calcifications of the mitral valve annulus and aortic leaflets are noted. Atherosclerotic  calcifications of coronary arteries are present. No pericardial effusion. Normal caliber thoracic aorta with heavy calcification predominantly within the aortic arch. Normal 3 vessel branching of the arch some luminal narrowing of the proximal left subclavian artery just distal to the origin secondary to calcified and noncalcified atheromatous plaque. The central pulmonary arteries are borderline enlarged. No large central pulmonary arterial filling defect is seen however on this non tailored examination. Mediastinum/Nodes: No enlarged mediastinal, hilar, or axillary lymph nodes. Thyroid gland, trachea, and esophagus demonstrate no significant findings. Lungs/Pleura: Bandlike region of scarring and/or atelectasis with bronchiectatic change along the right major fissure. Additional bandlike subsegmental atelectasis present in the right lung base. No consolidation, features of edema, pneumothorax, or effusion. No suspicious pulmonary nodules or masses. Musculoskeletal: Diffusely sclerotic appearance of the bones. Exaggerated thoracic kyphosis and levocurvature of the thoracic spine apex at T8. Mild anterior wedging at T8, is remote and unchanged from comparison. No definite acute fracture or compression deformity. Multilevel facet degenerative changes and milder discogenic changes throughout the thoracic spine. CT ABDOMEN PELVIS FINDINGS Hepatobiliary: No focal liver abnormality is seen. No gallstones, gallbladder wall thickening, or biliary dilatation. Pancreas: Unremarkable. No pancreatic ductal dilatation or surrounding inflammatory changes. Spleen: Massive splenomegaly with the spleen extending from the left upper quadrant to the pelvis slightly crossing lobe midline.  No focal splenic lesion is seen. Adrenals/Urinary Tract: Displacement of the left kidney and adrenal gland towards midline secondary to the massive splenomegaly. Kidneys enhance and excrete symmetrically. No visible or contour deforming renal  lesions. No urolithiasis or hydronephrosis. Urinary bladder is unremarkable. Stomach/Bowel: Distal esophagus, stomach and duodenal sweep are unremarkable. No small bowel wall thickening or dilatation. No evidence of obstruction. A normal appendix is visualized. No colonic dilatation or wall thickening. Interposition of the splenic flexure anterior to the massively enlarged spleen. Vascular/Lymphatic: Engorged splenic vein and mild collateralization. Vasculature is otherwise unremarkable. Atherosclerotic plaque within the normal caliber aorta. Extensive calcification the branch vessels as well. Reproductive: The prostate and seminal vesicles are unremarkable. Other: No abdominopelvic free fluid or free gas. No bowel containing hernias. Musculoskeletal: Extensive sclerotic changes of the bones throughout the abdomen and pelvis. No acute osseous abnormality. Multilevel degenerative changes are present in the imaged portions of the spine. Compensatory dextrocurvature of the thoracolumbar junction centered at L2. IMPRESSION: 1. Massive splenomegaly with extensive sclerotic changes of the bones throughout chest, abdomen and pelvis likely reflective of patient's reported history of myelofibrosis. 2. Aortic atherosclerosis. Coronary artery calcifications. 3. Borderline enlarged central pulmonary arteries, which can be seen with pulmonary arterial hypertension. 4. Bandlike region of scarring and/or atelectasis with bronchiectatic change along the right major fissure. Additional bandlike subsegmental atelectasis present in the right lung base. 5. Aortic Atherosclerosis (ICD10-I70.0). 6. At least mild atheromatous narrowing of the proximal left subclavian artery. Electronically Signed   By: Lovena Le M.D.   On: 03/16/2019 00:44   Ct Abdomen Pelvis W Contrast  Result Date: 03/16/2019 CLINICAL DATA:  Shortness of breath and bilateral leg swelling, history of COPD, pneumonia, CHF, myelofibrosis disorder and melanoma EXAM:  CT CHEST, ABDOMEN, AND PELVIS WITH CONTRAST TECHNIQUE: Multidetector CT imaging of the chest, abdomen and pelvis was performed following the standard protocol during bolus administration of intravenous contrast. CONTRAST:  173m OMNIPAQUE IOHEXOL 300 MG/ML  SOLN COMPARISON:  CTA chest 06/17/2017 FINDINGS: CT CHEST FINDINGS Cardiovascular: Cardiac size at the upper limits of normal. Calcifications of the mitral valve annulus and aortic leaflets are noted. Atherosclerotic calcifications of coronary arteries are present. No pericardial effusion. Normal caliber thoracic aorta with heavy calcification predominantly within the aortic arch. Normal 3 vessel branching of the arch some luminal narrowing of the proximal left subclavian artery just distal to the origin secondary to calcified and noncalcified atheromatous plaque. The central pulmonary arteries are borderline enlarged. No large central pulmonary arterial filling defect is seen however on this non tailored examination. Mediastinum/Nodes: No enlarged mediastinal, hilar, or axillary lymph nodes. Thyroid gland, trachea, and esophagus demonstrate no significant findings. Lungs/Pleura: Bandlike region of scarring and/or atelectasis with bronchiectatic change along the right major fissure. Additional bandlike subsegmental atelectasis present in the right lung base. No consolidation, features of edema, pneumothorax, or effusion. No suspicious pulmonary nodules or masses. Musculoskeletal: Diffusely sclerotic appearance of the bones. Exaggerated thoracic kyphosis and levocurvature of the thoracic spine apex at T8. Mild anterior wedging at T8, is remote and unchanged from comparison. No definite acute fracture or compression deformity. Multilevel facet degenerative changes and milder discogenic changes throughout the thoracic spine. CT ABDOMEN PELVIS FINDINGS Hepatobiliary: No focal liver abnormality is seen. No gallstones, gallbladder wall thickening, or biliary  dilatation. Pancreas: Unremarkable. No pancreatic ductal dilatation or surrounding inflammatory changes. Spleen: Massive splenomegaly with the spleen extending from the left upper quadrant to the pelvis slightly crossing lobe midline. No focal splenic lesion is seen. Adrenals/Urinary  Tract: Displacement of the left kidney and adrenal gland towards midline secondary to the massive splenomegaly. Kidneys enhance and excrete symmetrically. No visible or contour deforming renal lesions. No urolithiasis or hydronephrosis. Urinary bladder is unremarkable. Stomach/Bowel: Distal esophagus, stomach and duodenal sweep are unremarkable. No small bowel wall thickening or dilatation. No evidence of obstruction. A normal appendix is visualized. No colonic dilatation or wall thickening. Interposition of the splenic flexure anterior to the massively enlarged spleen. Vascular/Lymphatic: Engorged splenic vein and mild collateralization. Vasculature is otherwise unremarkable. Atherosclerotic plaque within the normal caliber aorta. Extensive calcification the branch vessels as well. Reproductive: The prostate and seminal vesicles are unremarkable. Other: No abdominopelvic free fluid or free gas. No bowel containing hernias. Musculoskeletal: Extensive sclerotic changes of the bones throughout the abdomen and pelvis. No acute osseous abnormality. Multilevel degenerative changes are present in the imaged portions of the spine. Compensatory dextrocurvature of the thoracolumbar junction centered at L2. IMPRESSION: 1. Massive splenomegaly with extensive sclerotic changes of the bones throughout chest, abdomen and pelvis likely reflective of patient's reported history of myelofibrosis. 2. Aortic atherosclerosis. Coronary artery calcifications. 3. Borderline enlarged central pulmonary arteries, which can be seen with pulmonary arterial hypertension. 4. Bandlike region of scarring and/or atelectasis with bronchiectatic change along the right  major fissure. Additional bandlike subsegmental atelectasis present in the right lung base. 5. Aortic Atherosclerosis (ICD10-I70.0). 6. At least mild atheromatous narrowing of the proximal left subclavian artery. Electronically Signed   By: Lovena Le M.D.   On: 03/16/2019 00:44   Vas [place] Lower Extremity Venous (dvt) (only [facility])  Result Date: 03/17/2019  Lower Venous Study Indications: SOB, and Edema.  Risk Factors: COPD. Comparison Study: Prior study from 04/20/17 is available for comparison Performing Technologist: Sharion Dove RVS  Examination Guidelines: A complete evaluation includes B-mode imaging, spectral Doppler, color Doppler, and power Doppler as needed of all accessible portions of each vessel. Bilateral testing is considered an integral part of a complete examination. Limited examinations for reoccurring indications may be performed as noted.  +---------+---------------+---------+-----------+----------+--------------+ RIGHT    CompressibilityPhasicitySpontaneityPropertiesThrombus Aging +---------+---------------+---------+-----------+----------+--------------+ CFV      Full                                                        +---------+---------------+---------+-----------+----------+--------------+ SFJ      Full                                                        +---------+---------------+---------+-----------+----------+--------------+ FV Prox  Partial                                      Chronic        +---------+---------------+---------+-----------+----------+--------------+ FV Mid                  Yes      Yes                  Chronic        +---------+---------------+---------+-----------+----------+--------------+ FV Distal  Yes      Yes                  Chronic        +---------+---------------+---------+-----------+----------+--------------+ PFV      Full                                                         +---------+---------------+---------+-----------+----------+--------------+ POP      Partial        Yes      Yes                  Chronic        +---------+---------------+---------+-----------+----------+--------------+ PTV      Full                                                        +---------+---------------+---------+-----------+----------+--------------+ PERO     Full                                                        +---------+---------------+---------+-----------+----------+--------------+   +---------+---------------+---------+-----------+----------+-------------------+ LEFT     CompressibilityPhasicitySpontaneityPropertiesThrombus Aging      +---------+---------------+---------+-----------+----------+-------------------+ CFV      Full           Yes      Yes                                      +---------+---------------+---------+-----------+----------+-------------------+ SFJ      Full                                                             +---------+---------------+---------+-----------+----------+-------------------+ FV Prox  Full                                                             +---------+---------------+---------+-----------+----------+-------------------+ FV Mid                  Yes      Yes                  patent by color and                                                       Doppler             +---------+---------------+---------+-----------+----------+-------------------+ FV Distal  Yes      Yes                  patent by color and                                                       Doppler             +---------+---------------+---------+-----------+----------+-------------------+ PFV      Full                                                             +---------+---------------+---------+-----------+----------+-------------------+ POP      Full           Yes       Yes                                      +---------+---------------+---------+-----------+----------+-------------------+ PTV      Full                                                             +---------+---------------+---------+-----------+----------+-------------------+ PERO     Full                                                             +---------+---------------+---------+-----------+----------+-------------------+     Summary: Right: Findings consistent with 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 Findings appear essentially unchanged compared to previous examination. Interstitial fluid noted in the calf Left: There is no evidence of deep vein thrombosis in the lower extremity. Interstitial fluid noted in the calf  *See table(s) above for measurements and observations. Electronically signed by Curt Jews MD on 03/17/2019 at 9:28:32 AM.    Final     2D echo (03/16/2019) Left ventricular ejection fraction, by visual estimation, is 60 to 65%. The left ventricle has normal function. There is no left ventricular hypertrophy. 2. Elevated left atrial pressure. 3. Left ventricular diastolic parameters are consistent with Grade I diastolic dysfunction (impaired relaxation). 4. Global right ventricle has normal systolic function.The right ventricular size is mildly enlarged. 5. Left atrial size was normal. 6. Right atrial size was normal. 7. Moderate mitral annular calcification. 8. The mitral valve is abnormal. Trace mitral valve regurgitation. Mild mitral stenosis. 9. The tricuspid valve is normal in structure. Tricuspid valve regurgitation is trivial. 10. The aortic valve is tricuspid. Aortic valve regurgitation is not visualized. Mild aortic valve sclerosis without stenosis. 11. The pulmonic valve was normal in structure. Pulmonic valve regurgitation is trivial. 12. The inferior vena cava is normal in size with greater than  50% respiratory variability, suggesting right atrial pressure of 3 mmHg.  13. Normal LV systolic function; grade 1 diastolic dysfunction; elevated LV filling pressure; MAC with turbulent MV inflow; mild MS (mean gradient 7 mmHg and MVA 1.5 cm2); mild RVE.  Subjective: Hemoglobin dropped to 6.3.  Given 1 unit PRBC.  Feels much better and leg swelling has significantly improved.  Denies further shortness of breath.  Wants to go home.  Discharge Exam: Vitals:   03/17/19 0614 03/17/19 0926  BP: (!) 113/91 132/66  Pulse: 81 80  Resp: 19 18  Temp: 97.8 F (36.6 C) 98.1 F (36.7 C)  SpO2: 100% 100%   Vitals:   03/17/19 0451 03/17/19 0542 03/17/19 0614 03/17/19 0926  BP: 109/60 (!) 126/55 (!) 113/91 132/66  Pulse: 79 77 81 80  Resp: _0 Temp: 98 F (36.7 C) 97.8 F (36.6 C) 97.8 F (36.6 C) 98.1 F (36.7 C)  TempSrc:  Oral Axillary Oral  SpO2: 96% 96% 100% 100%  Weight:      Height:        General: Not in distress HEENT: Pallor present, moist mucosa, supple neck Chest: Clear breath sounds bilaterally CVs: Normal S1-S2, no murmur GI: Soft, distended abdomen with palpable spleen, nontender Musculoskeletal: Warm, trace edema bilaterally (improved)   The results of significant diagnostics from this hospitalization (including imaging, microbiology, ancillary and laboratory) are listed below for reference.     Microbiology: Recent Results (from the past 240 hour(s))  SARS CORONAVIRUS 2 (TAT 6-24 HRS) Nasopharyngeal Nasopharyngeal Swab     Status: None   Collection Time: 03/15/19 10:29 PM   Specimen: Nasopharyngeal Swab  Result Value Ref Range Status   SARS Coronavirus 2 NEGATIVE NEGATIVE Final    Comment: (NOTE) SARS-CoV-2 target nucleic acids are NOT DETECTED. The SARS-CoV-2 RNA is generally detectable in upper and lower respiratory specimens during the acute phase of infection. Negative results do not preclude SARS-CoV-2 infection, do not rule out co-infections  with other pathogens, and should not be used as the sole basis for treatment or other patient management decisions. Negative results must be combined with clinical observations, patient history, and epidemiological information. The expected result is Negative. Fact Sheet for Patients: SugarRoll.be Fact Sheet for Healthcare Providers: https://www.woods-mathews.com/ This test is not yet approved or cleared by the [place] FDA and  has been authorized for detection and/or diagnosis of SARS-CoV-2 by FDA under an Emergency Use Authorization (EUA). This EUA will remain  in effect (meaning this test can be used) for the duration of the COVID-19 declaration under Section 56 4(b)(1) of the Act, 21 U.S.C. section 360bbb-3(b)(1), unless the authorization is terminated or revoked sooner. Performed at [facility], [place] [address] [place], [place] [address]      Labs: BNP (last 3 results) Recent Labs    03/15/19 1832  BNP 381.0*   Basic Metabolic Panel: Recent Labs  Lab 03/15/19 1832 03/16/19 0600  NA 136 134*  K 4.4 4.3  CL 104 102  CO2 24 22  GLUCOSE 94 172*  BUN 17 14  CREATININE 1.13 0.94  CALCIUM 8.7* 8.6*  MG  --  2.1  PHOS  --  3.6   Liver Function Tests: Recent Labs  Lab 03/15/19 1832 03/16/19 0600  AST 19 16  ALT 24 21  ALKPHOS 104 91  BILITOT 1.3* 1.1  PROT 6.9 6.1*  ALBUMIN 4.0 3.7   No results for input(s): LIPASE, AMYLASE in the last 168 hours. No results for input(s): AMMONIA in the last 168 hours. CBC:  Recent Labs  Lab 03/15/19 1832 03/16/19 0600 03/17/19 0420  WBC 30.8* 27.7* 21.2*  NEUTROABS 11.2*  --   --   HGB 6.9* 7.4* 6.3*  HCT 23.7* 24.8* 21.0*  MCV 100.9* 100.4* 98.1  PLT 57* 46* 32*   Cardiac Enzymes: No results for input(s): CKTOTAL, CKMB, CKMBINDEX, TROPONINI in the last 168 hours. BNP: Invalid input(s): POCBNP CBG: No results for input(s): GLUCAP in the last 168  hours. D-Dimer No results for input(s): DDIMER in the last 72 hours. Hgb A1c No results for input(s): HGBA1C in the last 72 hours. Lipid Profile No results for input(s): CHOL, HDL, LDLCALC, TRIG, CHOLHDL, LDLDIRECT in the last 72 hours. Thyroid function studies Recent Labs    03/16/19 0600  TSH 1.806   Anemia work up Recent Labs    03/15/19 2310 03/15/19 2311  VITAMINB12  --  639  FOLATE  --  7.5  FERRITIN  --  137  TIBC  --  388  IRON  --  71  RETICCTPCT 6.8*  --    Urinalysis    Component Value Date/Time   COLORURINE YELLOW 05/07/2017 [place] 05/07/2017 1650   LABSPEC 1.019 05/07/2017 1650   PHURINE 5.0 05/07/2017 1650   GLUCOSEU NEGATIVE 05/07/2017 1650   HGBUR NEGATIVE 05/07/2017 1650   BILIRUBINUR NEGATIVE 05/07/2017 1650   KETONESUR 20 (A) 05/07/2017 1650   PROTEINUR NEGATIVE 05/07/2017 1650   NITRITE NEGATIVE 05/07/2017 1650   LEUKOCYTESUR NEGATIVE 05/07/2017 1650   Sepsis Labs Invalid input(s): PROCALCITONIN,  WBC,  LACTICIDVEN Microbiology Recent Results (from the past 240 hour(s))  SARS CORONAVIRUS 2 (TAT 6-24 HRS) Nasopharyngeal Nasopharyngeal Swab     Status: None   Collection Time: 03/15/19 10:29 PM   Specimen: Nasopharyngeal Swab  Result Value Ref Range Status   SARS Coronavirus 2 NEGATIVE NEGATIVE Final    Comment: (NOTE) SARS-CoV-2 target nucleic acids are NOT DETECTED. The SARS-CoV-2 RNA is generally detectable in upper and lower respiratory specimens during the acute phase of infection. Negative results do not preclude SARS-CoV-2 infection, do not rule out co-infections with other pathogens, and should not be used as the sole basis for treatment or other patient management decisions. Negative results must be combined with clinical observations, patient history, and epidemiological information. The expected result is Negative. Fact Sheet for Patients: SugarRoll.be Fact Sheet for Healthcare  Providers: https://www.woods-mathews.com/ This test is not yet approved or cleared by the [place] FDA and  has been authorized for detection and/or diagnosis of SARS-CoV-2 by FDA under an Emergency Use Authorization (EUA). This EUA will remain  in effect (meaning this test can be used) for the duration of the COVID-19 declaration under Section 56 4(b)(1) of the Act, 21 U.S.C. section 360bbb-3(b)(1), unless the authorization is terminated or revoked sooner. Performed at [facility], [address] [place], [place] [address]      Time coordinating discharge: 35 minutes  SIGNED:   Louellen Molder, MD  [facility] 03/17/2019, 10:37 AM Pager   If 7PM-7AM, please contact night-coverage [email or web address] Password [email or web address]

## 2019-03-17 NOTE — Progress Notes (Signed)
CRITICAL VALUE ALERT  Critical Value:  Hgb6.3 and platelet 32  Date & Time Notied:  [id number]- 11/28   Provider Notified:Bodenheimer,NP   Orders Received/Actions taken:waiting for further order

## 2019-03-19 LAB — BPAM RBC
Blood Product Expiration Date: 202012202359
Blood Product Expiration Date: 202012212359
ISSUE DATE / TIME: 202011270024
ISSUE DATE / TIME: 202011280550
Unit Type and Rh: 9500
Unit Type and Rh: 9500

## 2019-03-19 LAB — TYPE AND SCREEN
ABO/RH(D): O NEG
Antibody Screen: NEGATIVE
Unit division: 0
Unit division: 0

## 2019-06-13 ENCOUNTER — Emergency Department (HOSPITAL_COMMUNITY): Payer: No Typology Code available for payment source

## 2019-06-13 ENCOUNTER — Emergency Department (HOSPITAL_COMMUNITY)
Admission: EM | Admit: 2019-06-13 | Discharge: 2019-06-13 | Disposition: A | Discharge status: Home / Self Care | Payer: No Typology Code available for payment source | Attending: Emergency Medicine | Admitting: Emergency Medicine

## 2019-06-13 ENCOUNTER — Encounter (HOSPITAL_COMMUNITY): Payer: Self-pay | Admitting: Emergency Medicine

## 2019-06-13 ENCOUNTER — Other Ambulatory Visit: Payer: Self-pay

## 2019-06-13 DIAGNOSIS — Z79899 Other long term (current) drug therapy: Secondary | ICD-10-CM | POA: Insufficient documentation

## 2019-06-13 DIAGNOSIS — J449 Chronic obstructive pulmonary disease, unspecified: Secondary | ICD-10-CM | POA: Insufficient documentation

## 2019-06-13 DIAGNOSIS — Z87891 Personal history of nicotine dependence: Secondary | ICD-10-CM | POA: Insufficient documentation

## 2019-06-13 DIAGNOSIS — R0602 Shortness of breath: Secondary | ICD-10-CM

## 2019-06-13 DIAGNOSIS — D72829 Elevated white blood cell count, unspecified: Secondary | ICD-10-CM | POA: Insufficient documentation

## 2019-06-13 LAB — CBC WITH DIFFERENTIAL/PLATELET
Abs Immature Granulocytes: 11.82 10*3/uL — ABNORMAL HIGH (ref 0.00–0.07)
Band Neutrophils: 8 %
Basophils Absolute: 3.5 10*3/uL — ABNORMAL HIGH (ref 0.0–0.1)
Basophils Relative: 6 %
Blasts: 0 %
Eosinophils Absolute: 2.4 10*3/uL — ABNORMAL HIGH (ref 0.0–0.5)
Eosinophils Relative: 4 %
HCT: 29.4 % — ABNORMAL LOW (ref 39.0–52.0)
Hemoglobin: 8.6 g/dL — ABNORMAL LOW (ref 13.0–17.0)
Lymphocytes Relative: 15 %
Lymphs Abs: 8.9 10*3/uL — ABNORMAL HIGH (ref 0.7–4.0)
MCH: 31.6 pg (ref 26.0–34.0)
MCHC: 29.3 g/dL — ABNORMAL LOW (ref 30.0–36.0)
MCV: 108.1 fL — ABNORMAL HIGH (ref 80.0–100.0)
Metamyelocytes Relative: 8 %
Monocytes Absolute: 0.6 10*3/uL (ref 0.1–1.0)
Monocytes Relative: 1 %
Myelocytes: 10 %
Neutro Abs: 30.1 10*3/uL — ABNORMAL HIGH (ref 1.7–7.7)
Neutrophils Relative %: 43 %
Other: 3 %
Platelets: 52 10*3/uL — ABNORMAL LOW (ref 150–400)
Promyelocytes Relative: 2 %
RBC: 2.72 MIL/uL — ABNORMAL LOW (ref 4.22–5.81)
RDW: 24 % — ABNORMAL HIGH (ref 11.5–15.5)
WBC: 59.1 10*3/uL (ref 4.0–10.5)
nRBC: 46.6 % — ABNORMAL HIGH (ref 0.0–0.2)
nRBC: 47 /100 WBC — ABNORMAL HIGH

## 2019-06-13 LAB — BASIC METABOLIC PANEL
Anion gap: 9 (ref 5–15)
BUN: 13 mg/dL (ref 8–23)
CO2: 25 mmol/L (ref 22–32)
Calcium: 8.8 mg/dL — ABNORMAL LOW (ref 8.9–10.3)
Chloride: 103 mmol/L (ref 98–111)
Creatinine, Ser: 1.09 mg/dL (ref 0.61–1.24)
GFR calc Af Amer: >60 mL/min (ref >60)
GFR calc non Af Amer: >60 mL/min (ref >60)
Glucose, Bld: 72 mg/dL (ref 70–99)
Potassium: 4.6 mmol/L (ref 3.5–5.1)
Sodium: 137 mmol/L (ref 135–145)

## 2019-06-13 LAB — BRAIN NATRIURETIC PEPTIDE: B Natriuretic Peptide: 85.2 pg/mL (ref 0.0–100.0)

## 2019-06-13 MED ORDER — IPRATROPIUM BROMIDE HFA 17 MCG/ACT IN AERS
2.0000 | INHALATION_SPRAY | Freq: Once | RESPIRATORY_TRACT | Status: AC
  Administered 2019-06-13: 2 via RESPIRATORY_TRACT
  Filled 2019-06-13: qty 12.9

## 2019-06-13 MED ORDER — ALBUTEROL SULFATE HFA 108 (90 BASE) MCG/ACT IN AERS
2.0000 | INHALATION_SPRAY | Freq: Once | RESPIRATORY_TRACT | Status: AC
  Administered 2019-06-13: 14:00:00 2 via RESPIRATORY_TRACT
  Filled 2019-06-13: qty 6.7

## 2019-06-13 NOTE — ED Triage Notes (Signed)
Pt c/o shob for couple days (4-5 days same time that he stopped smoking). Pt reports got 2nd covid test today at [place].

## 2019-06-13 NOTE — ED Notes (Signed)
Date and time results received: 06/13/19 3:58 PM  (use smartphrase ".now" to insert current time)  Test: WBC Critical Value: 59.1  Name of Provider Notified: Crystal RN and Mickel Baas PA  Orders Received? Or Actions Taken?:

## 2019-06-13 NOTE — Discharge Instructions (Addendum)
Use the Atrovent inhaler two puffs twice daily. Use the albuterol inhaler (orange) 2 puffs every 4-6 hours as needed.  Follow up wit your hematologist- call to schedule appointment in the next 1-2 days. Return to the ER for new or worsening symptoms.

## 2019-06-13 NOTE — ED Provider Notes (Signed)
[facility] DEPT Provider Note   CSN: [id number] Arrival date & time: 06/13/19  1247     History Chief Complaint  Patient presents with  . Shortness of Breath    Carlos Vaughn is a 74 y.o. male.  74 year old male presents with complaint of shortness of breath.  Patient is a past medical history of COPD, myelofibrosis, anemia, DVT, is anticoagulated with Eliquis.  Patient states he is been short of breath for the past 4 days, is not improving, states feels similar to when he has been anemic in the past and needed a transfusion, also feels similar to prior pneumonia.  Patient reports cough is slightly worse than baseline, is nonproductive.  Denies fevers, chills, lower extremity swelling, chest pain.  Patient has been using his albuterol inhaler, states he does not feel like this is working, also using it less frequently because he is almost out.  Patient did have a Covid vaccine earlier today, does not feel change in symptoms of getting his vaccine.  No other complaints or concerns today.        Past Medical History:  Diagnosis Date  . Anxiety   . COPD (chronic obstructive pulmonary disease) ([place])   . Depression   . DVT (deep venous thrombosis) ([place])   . Hyperlipemia   . Melanoma ([place])   . Myelofibrosis ([place])   . PTSD (post-traumatic stress disorder)     Patient Active Problem List   Diagnosis Date Noted  . Bilateral leg edema 03/17/2019  . Bilateral lower extremity edema 03/16/2019  . COPD (chronic obstructive pulmonary disease) ([place]) 03/16/2019  . Shortness of breath 03/16/2019  . Myelofibrosis ([place])   . Symptomatic anemia 03/15/2019  . COPD with acute exacerbation ([place]) 03/15/2019  . Partial symptomatic epilepsy with complex partial seizures, not intractable, without status epilepticus ([place]) 07/21/2017  . Acute deep vein thrombosis (DVT) of distal vein of lower extremity (HCC)   . Acute deep vein thrombosis (DVT) of proximal vein of  lower extremity (HCC)   . Subdural hematoma ([place])   . Chronic obstructive pulmonary disease ([place])   . PTSD (post-traumatic stress disorder)   . Seizures ([place])   . Leukocytosis   . Thrombocytopenia ([place])   . Anemia of chronic disease   . Subdural hemorrhage ([place]) 05/07/2017  . Anticoagulated for Rt Leg DVT 05/07/2017  . History of DVT of lower extremity 02/2017 05/07/2017    Past Surgical History:  Procedure Laterality Date  . HERNIA REPAIR    . IR IVC FILTER PLMT / S&I /IMG GUID/MOD SED  05/08/2017  . IR RADIOLOGIST EVAL & MGMT  07/19/2017  . MELANOMA EXCISION     removed off back       Family History  Problem Relation Age of Onset  . Pneumonia Mother   . Breast cancer Mother   . Leukemia Father   . Cancer Sister        unsure of type - thinks it was breast cancer    Social History   Tobacco Use  . Smoking status: Former Research scientist (life sciences)  . Smokeless tobacco: Never Used  Substance Use Topics  . Alcohol use: Yes    Comment: 2 beers twice weekly  . Drug use: No    Home Medications Prior to Admission medications   Medication Sig Start Date End Date Taking? Authorizing Provider  apixaban (ELIQUIS) 2.5 MG TABS tablet Take 2.5 mg by mouth 2 (two) times daily.    [provider]  furosemide (LASIX)  40 MG tablet Take 1 tablet (40 mg total) by mouth daily as needed for fluid. 03/17/19 03/16/20  Dhungel, Nishant, MD  Ipratropium-Albuterol (COMBIVENT RESPIMAT) 20-100 MCG/ACT AERS respimat Inhale 1 puff into the lungs every 6 (six) hours as needed for wheezing or shortness of breath.     [provider]  levothyroxine (SYNTHROID) 75 MCG tablet Take 75 mcg by mouth daily before breakfast.    [provider]  Multiple Vitamin (MULTIVITAMIN WITH MINERALS) TABS tablet Take 1 tablet by mouth daily.    [provider]  prazosin (MINIPRESS) 2 MG capsule Take 2 mg by mouth at bedtime.     [provider]  ruxolitinib phosphate (JAKAFI) 10 MG tablet  Take 10-15 mg by mouth 2 (two) times daily. Take 10mg  in am and 15mg  in pm.    [provider]  simvastatin (ZOCOR) 40 MG tablet Take 40 mg by mouth at bedtime.     [provider]  traZODone (DESYREL) 150 MG tablet Take 150 mg by mouth at bedtime.    [provider]    Allergies    Oxycodone  Review of Systems   Review of Systems  Constitutional: Negative for fever.  HENT: Negative for congestion.   Respiratory: Positive for cough and shortness of breath.   Cardiovascular: Negative for chest pain, palpitations and leg swelling.  Gastrointestinal: Negative for abdominal pain, nausea and vomiting.  Musculoskeletal: Negative for myalgias.  Skin: Negative for rash and wound.  Allergic/Immunologic: Negative for immunocompromised state.  Neurological: Negative for weakness.  Psychiatric/Behavioral: Negative for confusion.  All other systems reviewed and are negative.   Physical Exam Updated Vital Signs BP 135/68 (BP Location: Right Arm)   Pulse 72   Temp 97.7 F (36.5 C) (Oral)   Resp (!) 26   SpO2 98%   Physical Exam Vitals and nursing note reviewed.  Constitutional:      General: He is not in acute distress.    Appearance: He is well-developed. He is not diaphoretic.  HENT:     Head: Normocephalic and atraumatic.  Cardiovascular:     Rate and Rhythm: Normal rate and regular rhythm.  Pulmonary:     Effort: Pulmonary effort is normal. Tachypnea present.     Breath sounds: Examination of the right-upper field reveals decreased breath sounds. Examination of the left-upper field reveals decreased breath sounds. Examination of the right-lower field reveals wheezing. Examination of the left-lower field reveals wheezing. Decreased breath sounds and wheezing present.  Chest:     Chest wall: No tenderness.  Musculoskeletal:     Right lower leg: No edema.     Left lower leg: No edema.  Skin:    General: Skin is warm and dry.  Neurological:     Mental  Status: He is alert and oriented to person, place, and time.  Psychiatric:        Behavior: Behavior normal.     ED Results / Procedures / Treatments   Labs (all labs ordered are listed, but only abnormal results are displayed) Labs Reviewed  BASIC METABOLIC PANEL - Abnormal; Notable for the following components:      Result Value   Calcium 8.8 (*)    All other components within normal limits  CBC WITH DIFFERENTIAL/PLATELET - Abnormal; Notable for the following components:   WBC 59.1 (*)    RBC 2.72 (*)    Hemoglobin 8.6 (*)    HCT 29.4 (*)    MCV 108.1 (*)  MCHC 29.3 (*)    RDW 24.0 (*)    Platelets 52 (*)    nRBC 46.6 (*)    nRBC 47 (*)    Neutro Abs 30.1 (*)    Lymphs Abs 8.9 (*)    Eosinophils Absolute 2.4 (*)    Basophils Absolute 3.5 (*)    Abs Immature Granulocytes 11.82 (*)    All other components within normal limits  BRAIN NATRIURETIC PEPTIDE  PATHOLOGIST SMEAR REVIEW    EKG EKG Interpretation  Date/Time:  Wednesday June 13 2019 12:55:36 EST Ventricular Rate:  68 PR Interval:    QRS Duration: 93 QT Interval:  386 QTC Calculation: 411 R Axis:   58 Text Interpretation: Sinus rhythm Low voltage, extremity leads No previous ECGs available Confirmed by Theotis Burrow [phone]) on 06/13/2019 2:13:52 PM   Radiology DG Chest 2 View  Result Date: 06/13/2019 CLINICAL DATA:  Shortness of breath EXAM: CHEST - 2 VIEW COMPARISON:  March 15, 2019 FINDINGS: There is mild atelectasis in the right middle lobe and left base regions. Lungs elsewhere are clear. Heart size and pulmonary vascularity are normal. No adenopathy. There is aortic atherosclerosis. There is degenerative change in the thoracic spine with anterior wedging of a midthoracic vertebral body, stable. IMPRESSION: Areas of slight atelectasis bilaterally. No edema or airspace opacity. Heart size normal. No adenopathy. Aortic Atherosclerosis (ICD10-I70.0). Electronically Signed   By: Lowella Grip III  M.D.   On: 06/13/2019 13:56    Procedures Procedures (including critical care time)  Medications Ordered in ED Medications  ipratropium (ATROVENT HFA) inhaler 2 puff (2 puffs Inhalation Given 06/13/19 1354)  albuterol (VENTOLIN HFA) 108 (90 Base) MCG/ACT inhaler 2 puff (2 puffs Inhalation Given 06/13/19 1354)    ED Course  I have reviewed the triage vital signs and the nursing notes.  Pertinent labs & imaging results that were available during my care of the patient were reviewed by me and considered in my medical decision making (see chart for details).  Clinical Course as of Jun 12 1633  Wed Jun 12, 5994  3980 74 year old male with past medical history of COPD and myelofibrosis, additional history listed below presents with complaint of shortness of breath with increased and neck cough without fever.  On exam, patient is well-appearing, lung sounds diminished in the upper fields, mild wheezing in the lower fields.  Patient was given albuterol and Atrovent inhaler treatments while awaiting labs, reports symptoms have slightly improved.  Chest x-ray without acute findings.  BMP without significant changes, BNP within normal limits.  CBC with increase in white count of 59,000, platelets 52, hemoglobin 8.6.  Hemoglobin without significant change from previous.  Case discussed with Dr. Rex Kras, ER attending who has seen the patient.  Case discussed with Dr. Irene Limbo with hematology, do not suspect sepsis in this patient as a cause of his elevated white count, question if the increase in white count is related to his myelofibrosis and his complaint of shortness of breath today.  Patient can follow-up with his hematologist at the [facility] in [place], patient feels confident that he will be able to get an appointment tomorrow but agrees to return to the emergency room over the weekend for any new or worsening symptoms.   [LM]    Clinical Course User Index [LM] Roque Lias   MDM Rules/Calculators/A&P                       Final Clinical Impression(s) / ED  Diagnoses Final diagnoses:  Shortness of breath  Leukocytosis, unspecified type    Rx / DC Orders ED Discharge Orders    None       Roque Lias 06/13/19 1635    Little, Wenda Overland, MD 06/16/19 1352

## 2019-06-14 LAB — PATHOLOGIST SMEAR REVIEW

## 2019-06-18 ENCOUNTER — Other Ambulatory Visit: Payer: Self-pay

## 2019-06-18 ENCOUNTER — Emergency Department (HOSPITAL_COMMUNITY)
Admission: EM | Admit: 2019-06-18 | Discharge: 2019-06-18 | Disposition: A | Discharge status: Home / Self Care | Payer: No Typology Code available for payment source | Attending: Emergency Medicine | Admitting: Emergency Medicine

## 2019-06-18 DIAGNOSIS — Z7901 Long term (current) use of anticoagulants: Secondary | ICD-10-CM | POA: Diagnosis not present

## 2019-06-18 DIAGNOSIS — Z87891 Personal history of nicotine dependence: Secondary | ICD-10-CM | POA: Diagnosis not present

## 2019-06-18 DIAGNOSIS — R040 Epistaxis: Secondary | ICD-10-CM | POA: Insufficient documentation

## 2019-06-18 DIAGNOSIS — R0602 Shortness of breath: Secondary | ICD-10-CM | POA: Diagnosis not present

## 2019-06-18 DIAGNOSIS — D696 Thrombocytopenia, unspecified: Secondary | ICD-10-CM | POA: Insufficient documentation

## 2019-06-18 DIAGNOSIS — J449 Chronic obstructive pulmonary disease, unspecified: Secondary | ICD-10-CM | POA: Insufficient documentation

## 2019-06-18 DIAGNOSIS — Z86718 Personal history of other venous thrombosis and embolism: Secondary | ICD-10-CM | POA: Insufficient documentation

## 2019-06-18 LAB — URINALYSIS, ROUTINE W REFLEX MICROSCOPIC
Bilirubin Urine: NEGATIVE
Glucose, UA: NEGATIVE mg/dL
Hgb urine dipstick: NEGATIVE
Ketones, ur: NEGATIVE mg/dL
Leukocytes,Ua: NEGATIVE
Nitrite: NEGATIVE
Protein, ur: NEGATIVE mg/dL
Specific Gravity, Urine: 1.009 (ref 1.005–1.030)
pH: 5 (ref 5.0–8.0)

## 2019-06-18 MED ORDER — ATROVENT HFA 17 MCG/ACT IN AERS: 2.0000 | INHALATION_SPRAY | Freq: Four times a day (QID) | RESPIRATORY_TRACT | 6 refills | Status: DC | PRN

## 2019-06-18 MED ORDER — ALBUTEROL SULFATE HFA 108 (90 BASE) MCG/ACT IN AERS: 1.0000 | INHALATION_SPRAY | Freq: Four times a day (QID) | RESPIRATORY_TRACT | 6 refills | Status: AC | PRN

## 2019-06-18 NOTE — ED Provider Notes (Signed)
  Face-to-face evaluation   History: He presents for evaluation of nosebleed and shortness of breath.  Nosebleed is intermittent.  Shortness of breath is ongoing.  Evaluate for same here about 2 weeks ago, discharged with Atrovent and albuterol inhalers.  He has not been able to see his PCP at the [place], since then because of difficulty getting into the clinic secondary to the pandemic.  Physical exam: Heart, calm cooperative.  No respiratory distress or stridor.  Lungs with decreased air movement bilaterally.  No audible wheezes rales or rhonchi.  Legs without edema.   Medical screening examination/treatment/procedure(s) were conducted as a shared visit with non-physician practitioner(s) and myself.  I personally evaluated the patient during the encounter    Daleen Bo, MD 06/18/19 [phone]

## 2019-06-18 NOTE — ED Triage Notes (Signed)
74 yo male from home c/o epistaxis intermittently for the past 2 weeks. Pt is currently on Eliquis. Also C/O chronic SHOB from COPD. Denies O2 dependency. Pt sts was sent here by [facility] for eval. A/Ox3. Epistaxis controlled on arrival to ED. Denies HA or pain.

## 2019-06-18 NOTE — Discharge Instructions (Addendum)
If nose continues to bleed- blow out all the clots, spray Afrin in your nose, pinch the soft part of the nose closed for 15 minutes. If bleeding continues, return to the ER. If the nose bleeds off and on, you can follow up with the ENT, referral given.  You were recently diagnosed with emphysema. Your inhalers were refilled today. You should ask your primary care provider for a referral to a pulmonologist.

## 2019-06-18 NOTE — ED Provider Notes (Signed)
[facility] DEPT Provider Note   CSN: [id number] Arrival date & time: 06/18/19  1236     History Chief Complaint  Patient presents with  . Shortness of Breath  . Epistaxis    Carlos Vaughn is a 74 y.o. male.  74 year old male with past medical history of COPD, myelofibrosis, on Eliquis for prior DVT, presents with complaint of intermittent nosebleeds for the past 2 weeks.  Patient states bleeding will stop and then when he blows his nose bleeding will continue.  Primarily from the left side, occasionally from the right.  Patient went to his PCP at the [facility] and was advised to come to the ER.  Patient was last seen in the emergency room on February 24 for shortness of breath, was sent home with albuterol and Atrovent inhalers, states that these are helping however he remains short of breath at times.  Patient is scheduled to follow-up with his hematologist.  Denies fevers, no new complaints related to his shortness of breath or COPD.  Patient states he has urinary frequency, denies changes in medications otherwise, is not on a diuretic that he knows of, denies dysuria.  No other complaints or concerns.        Past Medical History:  Diagnosis Date  . Anxiety   . COPD (chronic obstructive pulmonary disease) ([place])   . Depression   . DVT (deep venous thrombosis) ([place])   . Hyperlipemia   . Melanoma ([place])   . Myelofibrosis ([place])   . PTSD (post-traumatic stress disorder)     Patient Active Problem List   Diagnosis Date Noted  . Bilateral leg edema 03/17/2019  . Bilateral lower extremity edema 03/16/2019  . COPD (chronic obstructive pulmonary disease) ([place]) 03/16/2019  . Shortness of breath 03/16/2019  . Myelofibrosis ([place])   . Symptomatic anemia 03/15/2019  . COPD with acute exacerbation ([place]) 03/15/2019  . Partial symptomatic epilepsy with complex partial seizures, not intractable, without status epilepticus ([place]) 07/21/2017  . Acute deep vein  thrombosis (DVT) of distal vein of lower extremity (HCC)   . Acute deep vein thrombosis (DVT) of proximal vein of lower extremity (HCC)   . Subdural hematoma ([place])   . Chronic obstructive pulmonary disease ([place])   . PTSD (post-traumatic stress disorder)   . Seizures ([place])   . Leukocytosis   . Thrombocytopenia ([place])   . Anemia of chronic disease   . Subdural hemorrhage ([place]) 05/07/2017  . Anticoagulated for Rt Leg DVT 05/07/2017  . History of DVT of lower extremity 02/2017 05/07/2017    Past Surgical History:  Procedure Laterality Date  . HERNIA REPAIR    . IR IVC FILTER PLMT / S&I /IMG GUID/MOD SED  05/08/2017  . IR RADIOLOGIST EVAL & MGMT  07/19/2017  . MELANOMA EXCISION     removed off back       Family History  Problem Relation Age of Onset  . Pneumonia Mother   . Breast cancer Mother   . Leukemia Father   . Cancer Sister        unsure of type - thinks it was breast cancer    Social History   Tobacco Use  . Smoking status: Former Research scientist (life sciences)  . Smokeless tobacco: Never Used  Substance Use Topics  . Alcohol use: Yes    Comment: 2 beers twice weekly  . Drug use: No    Home Medications Prior to Admission medications   Medication Sig Start Date End Date Taking? Authorizing Provider  apixaban (  ELIQUIS) 2.5 MG TABS tablet Take 2.5 mg by mouth 2 (two) times daily.   Yes [provider]  danazol (DANOCRINE) 200 MG capsule Take 200 mg by mouth 2 (two) times daily.   Yes [provider]  furosemide (LASIX) 40 MG tablet Take 1 tablet (40 mg total) by mouth daily as needed for fluid. 03/17/19 03/16/20 Yes Dhungel, Nishant, MD  prazosin (MINIPRESS) 2 MG capsule Take 2 mg by mouth at bedtime.    Yes [provider]  ruxolitinib phosphate (JAKAFI) 10 MG tablet Take 10-15 mg by mouth 2 (two) times daily. Take 10mg  in am and 15mg  in pm.   Yes [provider]  traZODone (DESYREL) 150 MG tablet Take 150 mg by mouth at bedtime.   Yes [provider]  albuterol (VENTOLIN HFA) 108 (90 Base) MCG/ACT inhaler Inhale 1-2 puffs into the lungs every 6 (six) hours as needed for wheezing or shortness of breath. 06/18/19   Tacy Learn, PA-C  ipratropium (ATROVENT HFA) 17 MCG/ACT inhaler Inhale 2 puffs into the lungs every 6 (six) hours as needed for wheezing. 06/18/19   Tacy Learn, PA-C    Allergies    Oxycodone  Review of Systems   Review of Systems  Constitutional: Negative for chills and fever.  HENT: Positive for nosebleeds.   Respiratory: Positive for cough and shortness of breath.   Cardiovascular: Negative for chest pain.  Musculoskeletal: Negative for arthralgias and myalgias.  Skin: Negative for rash and wound.  Neurological: Negative for dizziness, weakness and headaches.  All other systems reviewed and are negative.   Physical Exam Updated Vital Signs BP 137/66   Pulse 61   Temp 98.2 F (36.8 C) (Oral)   Resp 16   Ht 5\' 8"  (1.727 m)   Wt 68.9 kg   SpO2 96%   BMI 23.11 kg/m   Physical Exam Vitals and nursing note reviewed.  Constitutional:      General: He is not in acute distress.    Appearance: He is well-developed. He is not diaphoretic.  HENT:     Head: Normocephalic and atraumatic.     Nose:     Right Nostril: No foreign body, epistaxis or septal hematoma.     Left Nostril: Epistaxis present. No foreign body or septal hematoma.     Comments: Small active bleeding noted from left side septum. Cardiovascular:     Rate and Rhythm: Normal rate and regular rhythm.  Pulmonary:     Effort: Pulmonary effort is normal.     Breath sounds: Normal breath sounds. No decreased breath sounds.  Skin:    General: Skin is warm and dry.  Neurological:     Mental Status: He is alert and oriented to person, place, and time.  Psychiatric:        Behavior: Behavior normal.     ED Results / Procedures / Treatments   Labs (all labs ordered are listed, but only abnormal results are displayed) Labs  Reviewed  URINALYSIS, ROUTINE W REFLEX MICROSCOPIC    EKG None  Radiology No results found.  Procedures .Epistaxis Management  Date/Time: 06/18/2019 3:21 PM Performed by: Tacy Learn, PA-C Authorized by: Tacy Learn, PA-C   Consent:    Consent obtained:  Verbal   Consent given by:  Patient   Risks discussed:  Bleeding, infection, pain and nasal injury   Alternatives discussed:  No treatment Anesthesia (see MAR for exact dosages):    Anesthesia method:  None Procedure  details:    Treatment site:  L septum   Treatment method:  Silver nitrate   Treatment complexity:  Limited   Treatment episode: initial   Post-procedure details:    Assessment:  Bleeding stopped   Patient tolerance of procedure:  Tolerated well, no immediate complications   (including critical care time)  Medications Ordered in ED Medications - No data to display  ED Course  I have reviewed the triage vital signs and the nursing notes.  Pertinent labs & imaging results that were available during my care of the patient were reviewed by me and considered in my medical decision making (see chart for details).  Clinical Course as of Jun 17 1704  Mon Jun 18, 4335  1550 74 year old male presents with complaint of left side nosebleed off and on for 2 weeks.  Patient is on Eliquis, states that he blows his nose and blows out a lot of clots and then proceeds to have a continuous light bleeding.  Denies feeling weak, dizzy, lightheaded.  On exam has a small area in the left septum that is bleeding.  This area was treated with a silver nitrate stick and bleeding was controlled.  On recheck, bleeding had resumed and was treated a second time.  On recheck bleeding is controlled.  Patient was given instructions to use Afrin at home if bleeding continues, follow-up with ENT for intermittent bleeding, return to ER for uncontrollable bleeding.  Patient had reported urinary frequency, had a urinalysis done today that was  normal.  Patient also reports recently diagnosed with emphysema, his refills were refilled and he was advised to discuss pulmonology referral with his PCP.   [LM]    Clinical Course User Index [LM] Roque Lias   MDM Rules/Calculators/A&P                      Final Clinical Impression(s) / ED Diagnoses Final diagnoses:  Left-sided epistaxis    Rx / DC Orders ED Discharge Orders         Ordered    ipratropium (ATROVENT HFA) 17 MCG/ACT inhaler  Every 6 hours PRN     06/18/19 1627    albuterol (VENTOLIN HFA) 108 (90 Base) MCG/ACT inhaler  Every 6 hours PRN     06/18/19 1627           Tacy Learn, PA-C 06/18/19 1706    Daleen Bo, MD 06/18/19 [phone]

## 2019-06-21 ENCOUNTER — Other Ambulatory Visit: Payer: Self-pay

## 2019-06-21 ENCOUNTER — Ambulatory Visit: Payer: Medicare PPO | Admitting: Internal Medicine

## 2019-06-21 ENCOUNTER — Encounter: Payer: Self-pay | Admitting: Internal Medicine

## 2019-06-21 DIAGNOSIS — J449 Chronic obstructive pulmonary disease, unspecified: Secondary | ICD-10-CM

## 2019-06-21 DIAGNOSIS — R0609 Other forms of dyspnea: Secondary | ICD-10-CM

## 2019-06-21 DIAGNOSIS — R0600 Dyspnea, unspecified: Secondary | ICD-10-CM | POA: Diagnosis not present

## 2019-06-21 NOTE — Progress Notes (Signed)
Carlos Vaughn, male    DOB: [birth year],    MRN: [id number]   Brief patient profile:  1 yowm quit smoking 06/07/19   [employer] [place] [employer] [phone] with new onset doe ? I just can't tell you when"  S/p admit :  Admit date: 03/15/2019 Discharge date: 03/17/2019   Discharge Diagnoses:  Principal Problem:   Shortness of breath  Active Problems:   Anticoagulated for Rt Leg DVT   Seizures (HCC)   Leukocytosis   Thrombocytopenia (HCC)   Anemia of chronic disease   Symptomatic anemia   Bilateral lower extremity edema   COPD (chronic obstructive pulmonary disease) (HCC)   Myelofibrosis (HCC)   Bilateral leg edema   Brief narrative/HPI 74 year old male with myelofibrosis with significant splenomegaly, COPD, history of subdural hematoma in 2019, seizures, hypertension, hyperlipidemia, PTSD and 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 Reports his Eliquis dose was recently reduced by his PCP but has not missed any doses. Patient found to have drop in hemoglobin to 6.8, stool for Hemoccult was negative. Admitted for further management.  Hospital course   Principal problem Shortness of breath [facility]) Likely due to symptomatic anemia.COVID-19 testing negative. Received 1 unit PRBC with improvement in hemoglobin to 7.4, dropped again to 6.3 am of dc/ and .  Received second unit  Dyspnea and leg swelling have markedly improved with IV Lasix.  rx Lasix 40 mg daily as needed for dyspnea and leg swellings. 2D echo with normal EF, grade 1 diastolic dysfunction and normal right ventricular failure or pulmonary hypertension.  Does not want to stay further in the hospital or get repeat H&H.  Feels better and wants to go home.  I think he is stable to be discharged home.  Needs close monitoring of his labs as outpatient.  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 CT of the abdomen does not comment on displaced IVC filter.   2D echo unremarkable.Continue Eliquis.   Pancytopenia with myelofibrosis and massive splenomegaly Has significant leukocytosis, anemia and thrombocytopenia secondary to myelofibrosis. Discussed with hematology on-call Dr. Alvy Bimler who recommends transfusing as needed and follow-up with his hematologist at [facility]. Patient given 2 unit PRBC.  He did not want to wait for repeat H&H.  Has low platelets as well.  Eliquis has been continued at low-dose.  Ruxolitinib should be continued.  Follow repeat lab during his visit with hematologist next week.   COPD (chronic obstructive pulmonary disease) (HCC) Stable. Continue home inhaler.  No signs of cor pulmonale on echo.  Hypothyroidism Continue Synthroid      History of Present Illness  06/21/2019  Pulmonary/ 1st office eval/[facility]  Chief Complaint  Patient presents with  . Pulmonary Consult    Self referral.  Pt c/o DOE for the past 3-4 wks. He gets winded walking room to room. He uses his albuterol inhaler about 4 x per wk.   Dyspnea:  MMRC3 = can't walk 100 yards even at a slow pace at a flat grade s stopping due to sob eg can't do 3 aisles at grocery store Cough: minimal Sleep: no resp symptoms  SABA use: every 4 hours since last in [facility] ER   No obvious day to day or daytime variability or assoc excess/ purulent sputum or mucus plugs or hemoptysis or cp or chest tightness, subjective wheeze or  overt sinus or hb symptoms.   Sleeping  without nocturnal  or early am exacerbation  of respiratory  c/o's or need for noct saba. Also denies any obvious fluctuation of symptoms with weather or environmental changes or other aggravating or alleviating factors except as outlined above   No unusual exposure hx or h/o childhood pna/ asthma or knowledge of premature birth.  Current  Allergies, Complete Past Medical History, Past Surgical History, Family History, and Social History were reviewed in [employer] record.  ROS  The following are not active complaints unless bolded Hoarseness, sore throat, dysphagia, dental problems, itching, sneezing,  nasal congestion or discharge of excess mucus or purulent secretions, ear ache,   fever, chills, sweats, unintended wt loss or wt gain, classically pleuritic or exertional cp,  orthopnea pnd or arm/hand swelling  or leg swelling, presyncope, palpitations, abdominal pain, anorexia, nausea, vomiting, diarrhea  or change in bowel habits or change in bladder habits, change in stools or change in urine, dysuria, hematuria,  rash, arthralgias, visual complaints, headache, numbness, weakness or ataxia or problems with walking or coordination,  change in mood or  memory.           Past Medical History:  Diagnosis Date  . Anxiety   . COPD (chronic obstructive pulmonary disease) ([place])   . Depression   . DVT (deep venous thrombosis) ([place])   . Hyperlipemia   . Melanoma ([place])   . Myelofibrosis ([place])   . PTSD (post-traumatic stress disorder)     Outpatient Medications Prior to Visit  Medication Sig Dispense Refill  . albuterol (VENTOLIN HFA) 108 (90 Base) MCG/ACT inhaler Inhale 1-2 puffs into the lungs every 6 (six) hours as needed for wheezing or shortness of breath. 18 g 6  . apixaban (ELIQUIS) 2.5 MG TABS tablet 1/2 tablet twice daily    . danazol (DANOCRINE) 200 MG capsule Take 200 mg by mouth 2 (two) times daily.    . furosemide (LASIX) 40 MG tablet Take 1 tablet (40 mg total) by mouth daily as needed for fluid. 30 tablet 0  . levothyroxine (SYNTHROID) 75 MCG tablet Take 75 mcg by mouth daily before breakfast.    . prazosin (MINIPRESS) 2 MG capsule Take 2 mg by mouth at bedtime.     . ruxolitinib phosphate (JAKAFI) 10 MG tablet Take 10-15 mg by mouth 2 (two) times daily. Take 10mg  in am and 15mg  in pm.    .  Tiotropium Bromide-Olodaterol (STIOLTO RESPIMAT) 2.5-2.5 MCG/ACT AERS Inhale 2 puffs into the lungs daily.    . traZODone (DESYREL) 150 MG tablet Take 150 mg by mouth at bedtime.    [name] ipratropium (ATROVENT HFA) 17 MCG/ACT inhaler Inhale 2 puffs into the lungs every 6 (six) hours as needed for wheezing. 1 Inhaler 6      Objective:     BP 126/74 (BP Location: Left Arm, Cuff Size: Normal)   Pulse 74   Temp 97.9 F (36.6 C) (Temporal)   Ht 5\' 8"  (1.727 m)   Wt 149 lb (67.6 kg)   SpO2 98% Comment: on RA  BMI 22.66 kg/m   SpO2: 98 %(on RA)   amb wm easily confused with details of care    HEENT : pt wearing mask not removed for exam due to covid -19 concerns.    NECK :  without JVD/Nodes/TM/ nl carotid upstrokes bilaterally   LUNGS: no acc muscle use,  Mod barrel  contour chest wall with bilateral  Distant bs s  audible wheeze and  without cough on insp or exp maneuvers and mod  Hyperresonant  to  percussion bilaterally     CV:  RRR  no s3 or murmur or increase in P2, and trace edema both LE's  ABD:  Quite distended with massive splenomegaly with pos mid insp Hoover's  in the supine position. No bruits   appreciated, bowel sounds nl  MS:     ext warm without deformities, calf tenderness, cyanosis or clubbing No obvious joint restrictions   SKIN: warm and dry without lesions    NEURO:  alert, approp, nl sensorium with  no motor or cerebellar deficits apparent.        I personally reviewed images and agree with radiology impression as follows:  CXR:   06/13/19  Areas of slight atelectasis bilaterally. No edema or airspace opacity. Heart size normal. No adenopathy. Aortic Atherosclerosis    Assessment   COPD Group B historically  Stopped smoking 06/07/19 - 06/21/2019  After extensive coaching inhaler device,  effectiveness =    75% with smi so try stiolto x 4 weeks samples then return to re-eval  - 06/21/2019   Walked RA x two laps =  approx 529ft @ avg pace - stopped due to  end of study  with sats of 97% % at the end of the study.  By hx, Pt is Group B in terms of symptom/risk and laba/lama therefore appropriate rx at this point >>>  Continue stiolto but work on better smi technique and reduce saba to prn to reduce risk of tachyphylaxis  Advised: I spent extra time with pt today reviewing appropriate use of albuterol for prn use on exertion with the following points: 1) saba is for relief of sob that does not improve by walking a slower pace or resting but rather if the pt does not improve after trying this first. 2) If the pt is convinced, as many are, that saba helps recover from activity faster then it's easy to tell if this is the case by re-challenging : ie stop, take the inhaler, then p 5 minutes try the exact same activity (intensity of workload) that just caused the symptoms and see if they are substantially diminished or not after saba 3) if there is an activity that reproducibly causes the symptoms, try the saba 15 min before the activity on alternate days   If in fact the saba really does help, then fine to continue to use it prn but advised may need to look closer at the maintenance regimen being used to achieve better control of airways disease with exertion.             DOE (dyspnea on exertion) Echo 03/16/2019 2D echo with normal EF, grade 1 diastolic dysfunction and normal right ventricular failure or pulmonary hypertension.  Symptoms are   disproportionate to objective findings and not clear to what extent this is all a pulmonary  problem but pt does appear to have difficult to sort out respiratory symptoms of unknown origin for which  DDX  = almost all start with A and  include Adherence, Ace Inhibitors, Acid Reflux, Active Sinus Disease, Alpha 1 Antitripsin deficiency, Anxiety masquerading as Airways dz,  ABPA,  Allergy(esp in young), Aspiration (esp in elderly), Adverse effects of meds,  Active smoking or Vaping, A bunch of PE's/clot burden (a  few small clots can't cause this syndrome unless there is already severe underlying pulm or vascular dz with poor reserve),  Anemia or thyroid  disorder, plus two Bs  = Bronchiectasis and Beta blocker use..and one C= CHF   Adherence is always the initial "prime suspect" and is a multilayered concern that requires a "trust but verify" approach in every patient - starting with knowing how to use medications, especially inhalers, correctly, keeping up with refills and understanding the fundamental difference between maintenance and prns vs those medications only taken for a very short course and then stopped and not refilled.  - see hfa teaching  - return with all meds in hand using a trust but verify approach to confirm accurate Medication  Reconciliation The principal here is that until we are certain that the  patients are doing what we've asked, it makes no sense to ask them to do more.   Active smoking > denies, re-inforced importance  ? Anemia > clearly needs to be monitored closely while on Eliquis even in low doses give MPS  ? A Bunch of PE's > very  unlikely with IVC filter, eliquis  ? Anxiety/depression/ deconditioning/ cognitive decline  > usually at the bottom of this list of usual suspects but should included  based on H and P and note already on psychotropics and may interfere with adherence and also interpretation of response or lack thereof to symptom management which can be quite subjective.   ? CHF > echo reassuring            Each maintenance medication was reviewed in detail including emphasizing most importantly the difference between maintenance and prns and under what circumstances the prns are to be triggered using an action plan format where appropriate.  Total time for H and P, chart review, counseling, teaching device/  directly observing portions of ambulatory 02 saturation study/  and generating customized AVS unique to this office visit / charting = 60 min             Christinia Gully, MD 06/21/2019

## 2019-06-21 NOTE — Patient Instructions (Signed)
.  Plan A = Automatic = Always=    Stiolto 2puff each am   Work on inhaler technique:  relax and gently blow all the way out then take a nice smooth deep breath back in, triggering the inhaler at same time you start breathing in.  Hold for up to 5 seconds if you can.   Rinse and gargle with water when done   Plan B = Backup (to supplement plan A, not to replace it) Only use your albuterol inhaler as a rescue medication to be used if you can't catch your breath by resting or doing a relaxed purse lip breathing pattern.  - The less you use it, the better it will work when you need it. - Ok to use the inhaler up to 2 puffs  every 4 hours if you must but call for appointment if use goes up over your usual need - Don't leave home without it !!  (think of it like the spare tire for your car)    .Please schedule a follow up office visit in 4 weeks, sooner if needed  with all medications /inhalers/ solutions in hand so we can verify exactly what you are taking. This includes all medications from all doctors and over the counters

## 2019-06-22 ENCOUNTER — Encounter: Payer: Self-pay | Admitting: Internal Medicine

## 2019-06-22 NOTE — Assessment & Plan Note (Addendum)
Stopped smoking 06/07/19 - 06/21/2019  After extensive coaching inhaler device,  effectiveness =    75% with smi so try stiolto x 4 weeks samples then return to re-eval  - 06/21/2019   Walked RA x two laps =  approx 58ft @ avg pace - stopped due to end of study  with sats of 97% % at the end of the study.  By hx, Pt is Group B in terms of symptom/risk and laba/lama therefore appropriate rx at this point >>>  Continue stiolto but work on better smi technique and reduce saba to prn to reduce risk of tachyphylaxis  Advised: I spent extra time with pt today reviewing appropriate use of albuterol for prn use on exertion with the following points: 1) saba is for relief of sob that does not improve by walking a slower pace or resting but rather if the pt does not improve after trying this first. 2) If the pt is convinced, as many are, that saba helps recover from activity faster then it's easy to tell if this is the case by re-challenging : ie stop, take the inhaler, then p 5 minutes try the exact same activity (intensity of workload) that just caused the symptoms and see if they are substantially diminished or not after saba 3) if there is an activity that reproducibly causes the symptoms, try the saba 15 min before the activity on alternate days   If in fact the saba really does help, then fine to continue to use it prn but advised may need to look closer at the maintenance regimen being used to achieve better control of airways disease with exertion.

## 2019-06-22 NOTE — Assessment & Plan Note (Signed)
Echo 03/16/2019 2D echo with normal EF, grade 1 diastolic dysfunction and normal right ventricular failure or pulmonary hypertension.  Symptoms are   disproportionate to objective findings and not clear to what extent this is all a pulmonary  problem but pt does appear to have difficult to sort out respiratory symptoms of unknown origin for which  DDX  = almost all start with A and  include Adherence, Ace Inhibitors, Acid Reflux, Active Sinus Disease, Alpha 1 Antitripsin deficiency, Anxiety masquerading as Airways dz,  ABPA,  Allergy(esp in young), Aspiration (esp in elderly), Adverse effects of meds,  Active smoking or Vaping, A bunch of PE's/clot burden (a few small clots can't cause this syndrome unless there is already severe underlying pulm or vascular dz with poor reserve),  Anemia or thyroid disorder, plus two Bs  = Bronchiectasis and Beta blocker use..and one C= CHF   Adherence is always the initial "prime suspect" and is a multilayered concern that requires a "trust but verify" approach in every patient - starting with knowing how to use medications, especially inhalers, correctly, keeping up with refills and understanding the fundamental difference between maintenance and prns vs those medications only taken for a very short course and then stopped and not refilled.  - see hfa teaching  - return with all meds in hand using a trust but verify approach to confirm accurate Medication  Reconciliation The principal here is that until we are certain that the  patients are doing what we've asked, it makes no sense to ask them to do more.   Active smoking > denies, re-inforced importance  ? Anemia > clearly needs to be monitored closely while on Eliquis even in low doses give MPS  ? A Bunch of PE's > very  unlikely with IVC filter, eliquis  ? Anxiety/depression/ deconditioning/ cognitive decline  > usually at the bottom of this list of usual suspects but should included  based on H and P and note  already on psychotropics and may interfere with adherence and also interpretation of response or lack thereof to symptom management which can be quite subjective.   ? CHF > echo reassuring            Each maintenance medication was reviewed in detail including emphasizing most importantly the difference between maintenance and prns and under what circumstances the prns are to be triggered using an action plan format where appropriate.  Total time for H and P, chart review, counseling, teaching device/  directly observing portions of ambulatory 02 saturation study/  and generating customized AVS unique to this office visit / charting = 60 min

## 2019-07-23 ENCOUNTER — Ambulatory Visit: Payer: No Typology Code available for payment source | Admitting: Internal Medicine

## 2019-07-25 ENCOUNTER — Emergency Department (HOSPITAL_COMMUNITY)
Admission: EM | Admit: 2019-07-25 | Discharge: 2019-07-25 | Disposition: A | Discharge status: Home / Self Care | Payer: Medicare PPO | Attending: Emergency Medicine | Admitting: Emergency Medicine

## 2019-07-25 ENCOUNTER — Other Ambulatory Visit: Payer: Self-pay

## 2019-07-25 ENCOUNTER — Encounter (HOSPITAL_COMMUNITY): Payer: Self-pay

## 2019-07-25 DIAGNOSIS — R2243 Localized swelling, mass and lump, lower limb, bilateral: Secondary | ICD-10-CM | POA: Diagnosis present

## 2019-07-25 DIAGNOSIS — Z5321 Procedure and treatment not carried out due to patient leaving prior to being seen by health care provider: Secondary | ICD-10-CM | POA: Diagnosis not present

## 2019-07-25 NOTE — ED Triage Notes (Addendum)
Patient reports his hematologist took him off Danazol on Monday due to leg swelling.    Pateint told to come here to check for blood clots.  C/O bilateral leg swelling   A/Ox4 Ambulatory

## 2019-07-30 ENCOUNTER — Encounter: Payer: Self-pay | Admitting: Internal Medicine

## 2019-07-30 ENCOUNTER — Other Ambulatory Visit: Payer: Self-pay

## 2019-07-30 ENCOUNTER — Ambulatory Visit: Payer: Medicare PPO | Admitting: Internal Medicine

## 2019-07-30 DIAGNOSIS — R0600 Dyspnea, unspecified: Secondary | ICD-10-CM

## 2019-07-30 DIAGNOSIS — J449 Chronic obstructive pulmonary disease, unspecified: Secondary | ICD-10-CM | POA: Diagnosis not present

## 2019-07-30 DIAGNOSIS — R0609 Other forms of dyspnea: Secondary | ICD-10-CM

## 2019-07-30 NOTE — Progress Notes (Signed)
Carlos Vaughn, male    DOB: 02-Apr-1946,    MRN: [id number]   Brief patient profile:  50 yowm quit smoking 06/07/19   [employer] [place] [employer] [phone] with new onset doe ? I just can't tell you when"  S/p admit :  Admit date: 03/15/2019 Discharge date: 03/17/2019   Discharge Diagnoses:  Principal Problem:   Shortness of breath  Active Problems:   Anticoagulated for Rt Leg DVT   Seizures (HCC)   Leukocytosis   Thrombocytopenia (HCC)   Anemia of chronic disease   Symptomatic anemia   Bilateral lower extremity edema   COPD (chronic obstructive pulmonary disease) (HCC)   Myelofibrosis (HCC)   Bilateral leg edema   Brief narrative/HPI 74 year old male with myelofibrosis with significant splenomegaly, COPD, history of subdural hematoma in 2019, seizures, hypertension, hyperlipidemia, PTSD and 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 Reports his Eliquis dose was recently reduced by his PCP but has not missed any doses. Patient found to have drop in hemoglobin to 6.8, stool for Hemoccult was negative. Admitted for further management.  Hospital course   Principal problem Shortness of breath [facility]) Likely due to symptomatic anemia.COVID-19 testing negative. Received 1 unit PRBC with improvement in hemoglobin to 7.4, dropped again to 6.3 am of dc/ and .  Received second unit  Dyspnea and leg swelling have markedly improved with IV Lasix.  rx Lasix 40 mg daily as needed for dyspnea and leg swellings. 2D echo with normal EF, grade 1 diastolic dysfunction and normal right ventricular failure or pulmonary hypertension.  Does not want to stay further in the hospital or get repeat H&H.  Feels better and wants to go home.  I think he is stable to be discharged home.  Needs close monitoring of his labs as outpatient.  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 CT of the abdomen does not comment on displaced IVC filter.   2D echo unremarkable.Continue Eliquis.   Pancytopenia with myelofibrosis and massive splenomegaly Has significant leukocytosis, anemia and thrombocytopenia secondary to myelofibrosis. Discussed with hematology on-call Dr. Alvy Bimler who recommends transfusing as needed and follow-up with his hematologist at [facility]. Patient given 2 unit PRBC.  He did not want to wait for repeat H&H.  Has low platelets as well.  Eliquis has been continued at low-dose.  Ruxolitinib should be continued.  Follow repeat lab during his visit with hematologist next week.   COPD (chronic obstructive pulmonary disease) (HCC) Stable. Continue home inhaler.  No signs of cor pulmonale on echo.  Hypothyroidism Continue Synthroid      History of Present Illness  06/21/2019  Pulmonary/ 1st office eval/Carlos Vaughn  Chief Complaint  Patient presents with  . Pulmonary Consult    Self referral.  Pt c/o DOE for the past 3-4 wks. He gets winded walking room to room. He uses his albuterol inhaler about 4 x per wk.   Dyspnea:  MMRC3 = can't walk 100 yards even at a slow pace at a flat grade s stopping due to sob eg can't do 3 aisles at grocery store Cough: minimal Sleep: no resp symptoms  SABA use: every 4 hours since last in [facility] ER  rec .Plan A = Automatic = Always=    Stiolto 2puff each am  Work on inhaler technique:  Plan B = Backup (to supplement plan  A, not to replace it) Only use your albuterol inhaler as a rescue medication  Please schedule a follow up office visit in 4 weeks, sooner if needed  with all medications   07/30/2019  f/u ov/Carlos Vaughn re: doe no real change on stiolto  Chief Complaint  Patient presents with  . Follow-up    Breathing is about the same. He is bothered by nosebleeds and plans to see ENT. He is using his albuterol inhaler on average  once per wk.   Dyspnea:  Very inactive /mailbox and back slt incline all he can manage  Cough: none Sleeping: fine 30 degrees x one week helps breathing to sit up  SABA use: once a week - note much less since started stiolto 02: none    No obvious day to day or daytime variability or assoc excess/ purulent sputum or mucus plugs or hemoptysis or cp or chest tightness, subjective wheeze or overt sinus or hb symptoms.   Sleeping as above  without nocturnal  or early am exacerbation  of respiratory  c/o's or need for noct saba. Also denies any obvious fluctuation of symptoms with weather or environmental changes or other aggravating or alleviating factors except as outlined above   No unusual exposure hx or h/o childhood pna/ asthma or knowledge of premature birth.  Current Allergies, Complete Past Medical History, Past Surgical History, Family History, and Social History were reviewed in [employer] record.  ROS  The following are not active complaints unless bolded Hoarseness, sore throat, dysphagia, dental problems, itching, sneezing,  nasal congestion or discharge of excess mucus or purulent secretions, ear ache,   fever, chills, sweats, unintended wt loss or wt gain, classically pleuritic or exertional cp,  orthopnea pnd or arm/hand swelling  or leg swelling, presyncope, palpitations, abdominal pain, anorexia, nausea, vomiting, diarrhea  or change in bowel habits or change in bladder habits, change in stools or change in urine, dysuria, hematuria,  rash, arthralgias, visual complaints, headache, numbness, weakness or ataxia or problems with walking or coordination,  change in mood or  memory.        Current Meds  Medication Sig  . albuterol (VENTOLIN HFA) 108 (90 Base) MCG/ACT inhaler Inhale 1-2 puffs into the lungs every 6 (six) hours as needed for wheezing or shortness of breath.  [name] apixaban (ELIQUIS) 2.5 MG TABS tablet 1/2 tablet twice daily  .  CALCIUM-MAGNESIUM-ZINC PO Take 3 tablets by mouth daily.  . furosemide (LASIX) 40 MG tablet Take 1 tablet (40 mg total) by mouth daily as needed for fluid.  [name] gabapentin (NEURONTIN) 300 MG capsule Take 1-3 capsules by mouth at bedtime.  [name] levothyroxine (SYNTHROID) 75 MCG tablet Take 75 mcg by mouth daily before breakfast.  . Potassium 99 MG TABS Take 1 tablet by mouth daily.  . prazosin (MINIPRESS) 2 MG capsule Take 2 mg by mouth at bedtime.   . ruxolitinib phosphate (JAKAFI) 10 MG tablet Take 10-15 mg by mouth 2 (two) times daily. Take 10mg  in am and 5mg  in pm.  . Tiotropium Bromide-Olodaterol (STIOLTO RESPIMAT) 2.5-2.5 MCG/ACT AERS Inhale 2 puffs into the lungs daily.  . traZODone (DESYREL) 150 MG tablet Take 150 mg by mouth at bedtime.                Past Medical History:  Diagnosis Date  . Anxiety   . COPD (chronic obstructive pulmonary disease) ([place])   . Depression   . DVT (deep venous thrombosis) ([place])   . Hyperlipemia   .  Melanoma ([place])   . Myelofibrosis ([place])   . PTSD (post-traumatic stress disorder)        Objective:     Wt Readings from Last 3 Encounters:  07/30/19 153 lb 9.6 oz (69.7 kg)  06/21/19 149 lb (67.6 kg)  06/18/19 152 lb (68.9 kg)     Vital signs reviewed - Note on arrival 02 sats  98% on RA          LUNGS: no acc muscle use,  Mod barrel  contour chest     CV:  RRR  no s3 or murmur or increase in P2, and trace edema both LE's  ABD:  Quite distended with massive splenomegaly with pos mid insp Hoover's    HEENT : pt wearing mask not removed for exam due to covid -19 concerns.    NECK :  without JVD/Nodes/TM/ nl carotid upstrokes bilaterally   LUNGS: no acc muscle use,  Mod barrel  contour chest wall with bilateral  Distant bs s audible wheeze and  without cough on insp or exp maneuvers and mod  Hyperresonant  to  percussion bilaterally     CV:  RRR  no s3 or II/VI sem no or increase in P2, and  2 + pitting both LE edema   ABD:  soft and  nontender with pos mid insp Hoover's  in the supine position. No bruits or organomegaly appreciated, bowel sounds nl  MS:     ext warm without deformities, calf tenderness, cyanosis or clubbing No obvious joint restrictions   SKIN: warm and dry without lesions    NEURO:  alert, approp, nl sensorium with  no motor or cerebellar deficits apparent.              Assessment

## 2019-07-30 NOTE — Patient Instructions (Addendum)
If actively bleeding hold the eliquis (apixaban) until it stops then resume.  Talk to the doctor who prescribed your gabapentin as it may contributing to your swelling as can prazosin   Please schedule a follow up visit in 3 months but call sooner if needed  with all medications /inhalers/ solutions in hand so we can verify exactly what you are taking. This includes all medications from all doctors and over the counters  -  PFTs on return.

## 2019-07-31 ENCOUNTER — Encounter: Payer: Self-pay | Admitting: Internal Medicine

## 2019-07-31 NOTE — Assessment & Plan Note (Signed)
Echo 03/16/2019 2D echo with normal EF, grade 1 diastolic dysfunction and normal right ventricular failure or pulmonary hypertension.  Likely multifactorial but anemia from intermittent epistaxis plus limited bone marrow reserve may be playing a role.  rec stop eliquis if active bleeding until it stops/ f/u with ENT as planned.          Each maintenance medication was reviewed in detail including emphasizing most importantly the difference between maintenance and prns and under what circumstances the prns are to be triggered using an action plan format where appropriate.  Total time for H and P, chart review, counseling, teaching device and generating customized AVS unique to this office visit / charting = 20 min

## 2019-07-31 NOTE — Assessment & Plan Note (Signed)
Stopped smoking 06/07/19 - 06/21/2019   try stiolto x 4 weeks samples then return to re-eval  - 06/21/2019   Walked RA x two laps =  approx 552ft @ avg pace - stopped due to end of study  with sats of 97% % at the end of the study. - 07/30/2019  After extensive coaching inhaler device,  effectiveness =    90% with smi > continue stiolto   .Pt is Group B in terms of symptom/risk and laba/lama therefore appropriate rx at this point >>>  Continue stiolto pending return for full pfts

## 2019-08-07 ENCOUNTER — Encounter (HOSPITAL_COMMUNITY): Payer: Self-pay

## 2019-08-07 ENCOUNTER — Other Ambulatory Visit: Payer: Self-pay

## 2019-08-07 ENCOUNTER — Emergency Department (HOSPITAL_COMMUNITY): Payer: No Typology Code available for payment source

## 2019-08-07 ENCOUNTER — Emergency Department (HOSPITAL_COMMUNITY)
Admission: EM | Admit: 2019-08-07 | Discharge: 2019-08-08 | Disposition: A | Discharge status: Home / Self Care | Payer: No Typology Code available for payment source | Attending: Emergency Medicine | Admitting: Emergency Medicine

## 2019-08-07 DIAGNOSIS — Z87891 Personal history of nicotine dependence: Secondary | ICD-10-CM | POA: Diagnosis not present

## 2019-08-07 DIAGNOSIS — R2243 Localized swelling, mass and lump, lower limb, bilateral: Secondary | ICD-10-CM | POA: Diagnosis not present

## 2019-08-07 DIAGNOSIS — R0602 Shortness of breath: Secondary | ICD-10-CM | POA: Insufficient documentation

## 2019-08-07 DIAGNOSIS — J449 Chronic obstructive pulmonary disease, unspecified: Secondary | ICD-10-CM | POA: Diagnosis not present

## 2019-08-07 DIAGNOSIS — Z79899 Other long term (current) drug therapy: Secondary | ICD-10-CM | POA: Insufficient documentation

## 2019-08-07 DIAGNOSIS — D649 Anemia, unspecified: Secondary | ICD-10-CM | POA: Diagnosis not present

## 2019-08-07 DIAGNOSIS — Z85820 Personal history of malignant melanoma of skin: Secondary | ICD-10-CM | POA: Insufficient documentation

## 2019-08-07 LAB — CBC WITH DIFFERENTIAL/PLATELET
Abs Immature Granulocytes: 14.67 10*3/uL — ABNORMAL HIGH (ref 0.00–0.07)
Basophils Absolute: 4.9 10*3/uL — ABNORMAL HIGH (ref 0.0–0.1)
Basophils Relative: 8 %
Eosinophils Absolute: 0.4 10*3/uL (ref 0.0–0.5)
Eosinophils Relative: 1 %
HCT: 25.4 % — ABNORMAL LOW (ref 39.0–52.0)
Hemoglobin: 7.4 g/dL — ABNORMAL LOW (ref 13.0–17.0)
Immature Granulocytes: 23 %
Lymphocytes Relative: 9 %
Lymphs Abs: 5.5 10*3/uL — ABNORMAL HIGH (ref 0.7–4.0)
MCH: 31.8 pg (ref 26.0–34.0)
MCHC: 29.1 g/dL — ABNORMAL LOW (ref 30.0–36.0)
MCV: 109 fL — ABNORMAL HIGH (ref 80.0–100.0)
Monocytes Absolute: 9.3 10*3/uL — ABNORMAL HIGH (ref 0.1–1.0)
Monocytes Relative: 15 %
Neutro Abs: 27.9 10*3/uL — ABNORMAL HIGH (ref 1.7–7.7)
Neutrophils Relative %: 44 %
Platelets: 64 10*3/uL — ABNORMAL LOW (ref 150–400)
RBC: 2.33 MIL/uL — ABNORMAL LOW (ref 4.22–5.81)
RDW: 25 % — ABNORMAL HIGH (ref 11.5–15.5)
WBC: 62.7 10*3/uL (ref 4.0–10.5)
nRBC: 54.9 % — ABNORMAL HIGH (ref 0.0–0.2)

## 2019-08-07 LAB — BASIC METABOLIC PANEL
Anion gap: 9 (ref 5–15)
BUN: 19 mg/dL (ref 8–23)
CO2: 28 mmol/L (ref 22–32)
Calcium: 8.7 mg/dL — ABNORMAL LOW (ref 8.9–10.3)
Chloride: 97 mmol/L — ABNORMAL LOW (ref 98–111)
Creatinine, Ser: 1.3 mg/dL — ABNORMAL HIGH (ref 0.61–1.24)
GFR calc Af Amer: >60 mL/min (ref >60)
GFR calc non Af Amer: 53 mL/min — ABNORMAL LOW (ref >60)
Glucose, Bld: 100 mg/dL — ABNORMAL HIGH (ref 70–99)
Potassium: 4.7 mmol/L (ref 3.5–5.1)
Sodium: 134 mmol/L — ABNORMAL LOW (ref 135–145)

## 2019-08-07 LAB — URINALYSIS, ROUTINE W REFLEX MICROSCOPIC
Bilirubin Urine: NEGATIVE
Glucose, UA: NEGATIVE mg/dL
Hgb urine dipstick: NEGATIVE
Ketones, ur: NEGATIVE mg/dL
Leukocytes,Ua: NEGATIVE
Nitrite: NEGATIVE
Protein, ur: NEGATIVE mg/dL
Specific Gravity, Urine: 1.003 — ABNORMAL LOW (ref 1.005–1.030)
pH: 7 (ref 5.0–8.0)

## 2019-08-07 LAB — BRAIN NATRIURETIC PEPTIDE: B Natriuretic Peptide: 82.8 pg/mL (ref 0.0–100.0)

## 2019-08-07 LAB — TROPONIN I (HIGH SENSITIVITY): Troponin I (High Sensitivity): 6 ng/L (ref <18)

## 2019-08-07 MED ORDER — IOHEXOL 350 MG/ML SOLN
100.0000 mL | Freq: Once | INTRAVENOUS | Status: AC | PRN
  Administered 2019-08-07: 100 mL via INTRAVENOUS

## 2019-08-07 MED ORDER — SODIUM CHLORIDE (PF) 0.9 % IJ SOLN
INTRAMUSCULAR | Status: AC
  Filled 2019-08-07: qty 50

## 2019-08-07 NOTE — ED Triage Notes (Signed)
Patient arrived stating he has had increased shortness of breath over the last few days. Denies any pain at this time. Denies any sick contacts. Reporting a history of COPD

## 2019-08-08 ENCOUNTER — Telehealth: Payer: Self-pay | Admitting: Internal Medicine

## 2019-08-08 NOTE — ED Provider Notes (Signed)
[place] DEPT Provider Note   CSN: [id number] Arrival date & time: 08/07/19  2101     History Chief Complaint  Patient presents with  . Shortness of Breath    Carlos Vaughn is a 74 y.o. male.  Presented to emergency room with chief complaint of shortness of breath.  Patient states over the last few days he has had to increase shortness of breath, states worse with exertion, alleviated by rest.  No associated chest pain. Worse with lying flat.  Reports he has history of similar symptoms but seems to be worse than normal.  No recent nosebleeds, no blood in stool.  No recent cough, recent fever.  Notable medical history of COPD, DVT, myelofibrosis, hyperlipidemia.  Followed by pulmonology, hematology.  Currently off AC.   HPI     Past Medical History:  Diagnosis Date  . Anxiety   . COPD (chronic obstructive pulmonary disease) ([place])   . Depression   . DVT (deep venous thrombosis) ([place])   . Hyperlipemia   . Melanoma ([place])   . Myelofibrosis ([place])   . PTSD (post-traumatic stress disorder)     Patient Active Problem List   Diagnosis Date Noted  . Bilateral leg edema 03/17/2019  . Bilateral lower extremity edema 03/16/2019  . COPD Group B historically  03/16/2019  . DOE (dyspnea on exertion) 03/16/2019  . Myelofibrosis ([place])   . Symptomatic anemia 03/15/2019  . COPD with acute exacerbation ([place]) 03/15/2019  . Partial symptomatic epilepsy with complex partial seizures, not intractable, without status epilepticus ([place]) 07/21/2017  . Acute deep vein thrombosis (DVT) of distal vein of lower extremity (HCC)   . Acute deep vein thrombosis (DVT) of proximal vein of lower extremity (HCC)   . Subdural hematoma ([place])   . Chronic obstructive pulmonary disease ([place])   . PTSD (post-traumatic stress disorder)   . Seizures ([place])   . Leukocytosis   . Thrombocytopenia ([place])   . Anemia of chronic disease   . Subdural hemorrhage ([place]) 05/07/2017  .  Anticoagulated for Rt Leg DVT 05/07/2017  . History of DVT of lower extremity 02/2017 05/07/2017    Past Surgical History:  Procedure Laterality Date  . HERNIA REPAIR    . IR IVC FILTER PLMT / S&I /IMG GUID/MOD SED  05/08/2017  . IR RADIOLOGIST EVAL & MGMT  07/19/2017  . MELANOMA EXCISION     removed off back       Family History  Problem Relation Age of Onset  . Pneumonia Mother   . Breast cancer Mother   . Leukemia Father   . Cancer Sister        unsure of type - thinks it was breast cancer    Social History   Tobacco Use  . Smoking status: Former Smoker    Packs/day: 0.50    Years: 40.00    Pack years: 20.00    Types: Cigarettes    Quit date: 06/07/2019    Years since quitting: 0.1  . Smokeless tobacco: Never Used  Substance Use Topics  . Alcohol use: Yes    Comment: 2 beers twice weekly  . Drug use: No    Home Medications Prior to Admission medications   Medication Sig Start Date End Date Taking? Authorizing Provider  albuterol (VENTOLIN HFA) 108 (90 Base) MCG/ACT inhaler Inhale 1-2 puffs into the lungs every 6 (six) hours as needed for wheezing or shortness of breath. 06/18/19  Yes Tacy Learn, PA-C  CALCIUM-MAGNESIUM-ZINC PO Take  3 tablets by mouth daily.   Yes [provider]  furosemide (LASIX) 40 MG tablet Take 1 tablet (40 mg total) by mouth daily as needed for fluid. Patient taking differently: Take 40 mg by mouth daily.  03/17/19 03/16/20 Yes Dhungel, Nishant, MD  Ipratropium-Albuterol (COMBIVENT RESPIMAT) 20-100 MCG/ACT AERS respimat Inhale 1 puff into the lungs every 6 (six) hours.   Yes [provider]  levothyroxine (SYNTHROID) 75 MCG tablet Take 75 mcg by mouth daily before breakfast.   Yes [provider]  Multiple Vitamin (MULTIVITAMIN) tablet Take 1 tablet by mouth daily.   Yes [provider]  Potassium 99 MG TABS Take 1 tablet by mouth daily.   Yes [provider]  prazosin (MINIPRESS) 2 MG capsule  Take 2 mg by mouth at bedtime.    Yes [provider]  ruxolitinib phosphate (JAKAFI) 10 MG tablet Take 10-15 mg by mouth 2 (two) times daily. Take 10mg  in am and 5mg  in pm.   Yes [provider]  Tiotropium Bromide-Olodaterol (STIOLTO RESPIMAT) 2.5-2.5 MCG/ACT AERS Inhale 2 puffs into the lungs daily.   Yes [provider]  traZODone (DESYREL) 150 MG tablet Take 150 mg by mouth at bedtime.   Yes [provider]  gabapentin (NEURONTIN) 300 MG capsule Take 1-3 capsules by mouth at bedtime. 07/05/19 08/04/19  [provider]    Allergies    Oxycodone  Review of Systems   Review of Systems  Constitutional: Negative for chills and fever.  HENT: Negative for ear pain and sore throat.   Eyes: Negative for pain and visual disturbance.  Respiratory: Positive for shortness of breath. Negative for cough.   Cardiovascular: Negative for chest pain and palpitations.  Gastrointestinal: Negative for abdominal pain and vomiting.  Genitourinary: Negative for dysuria and hematuria.  Musculoskeletal: Negative for arthralgias and back pain.  Skin: Negative for color change and rash.  Neurological: Negative for seizures and syncope.  All other systems reviewed and are negative.   Physical Exam Updated Vital Signs BP (!) 148/75   Pulse 82   Temp 98 F (36.7 C) (Oral)   Resp 20   SpO2 100%   Physical Exam Vitals and nursing note reviewed.  Constitutional:      Appearance: He is well-developed.  HENT:     Head: Normocephalic and atraumatic.  Eyes:     Conjunctiva/sclera: Conjunctivae normal.  Cardiovascular:     Rate and Rhythm: Normal rate and regular rhythm.     Heart sounds: No murmur.  Pulmonary:     Effort: Pulmonary effort is normal. No respiratory distress.     Breath sounds: Normal breath sounds.  Chest:     Chest wall: No tenderness or crepitus.  Abdominal:     Palpations: Abdomen is soft.     Tenderness: There is no abdominal  tenderness.  Musculoskeletal:     Cervical back: Neck supple.     Comments: Mild non pitting edema in b/l lower legs, no TTP throughout extremity  Skin:    General: Skin is warm and dry.  Neurological:     General: No focal deficit present.     Mental Status: He is alert and oriented to person, place, and time.  Psychiatric:        Mood and Affect: Mood normal.        Behavior: Behavior normal.     ED Results / Procedures / Treatments   Labs (all labs ordered are listed, but only abnormal results are  displayed) Labs Reviewed  CBC WITH DIFFERENTIAL/PLATELET - Abnormal; Notable for the following components:      Result Value   WBC 62.7 (*)    RBC 2.33 (*)    Hemoglobin 7.4 (*)    HCT 25.4 (*)    MCV 109.0 (*)    MCHC 29.1 (*)    RDW 25.0 (*)    Platelets 64 (*)    nRBC 54.9 (*)    Neutro Abs 27.9 (*)    Lymphs Abs 5.5 (*)    Monocytes Absolute 9.3 (*)    Basophils Absolute 4.9 (*)    Abs Immature Granulocytes 14.67 (*)    All other components within normal limits  BASIC METABOLIC PANEL - Abnormal; Notable for the following components:   Sodium 134 (*)    Chloride 97 (*)    Glucose, Bld 100 (*)    Creatinine, Ser 1.30 (*)    Calcium 8.7 (*)    GFR calc non Af Amer 53 (*)    All other components within normal limits  URINALYSIS, ROUTINE W REFLEX MICROSCOPIC - Abnormal; Notable for the following components:   Color, Urine STRAW (*)    Specific Gravity, Urine 1.003 (*)    All other components within normal limits  BRAIN NATRIURETIC PEPTIDE  TROPONIN I (HIGH SENSITIVITY)  TROPONIN I (HIGH SENSITIVITY)    EKG EKG Interpretation  Date/Time:  Tuesday August 07 2019 21:14:03 EDT Ventricular Rate:  73 PR Interval:    QRS Duration: 97 QT Interval:  400 QTC Calculation: 441 R Axis:   48 Text Interpretation: Sinus rhythm Low voltage, precordial leads 12 Lead; [place] Confirmed by Madalyn Rob [phone]) on 08/07/2019 9:47:29 PM   Radiology DG Chest 2  View  Result Date: 08/07/2019 CLINICAL DATA:  74 year old male with shortness of breath. EXAM: CHEST - 2 VIEW COMPARISON:  Chest radiograph dated 06/13/2019. FINDINGS: Bilateral linear atelectasis/scarring. No focal consolidation, pleural effusion or pneumothorax. The cardiac silhouette is within normal limits. No acute osseous pathology. IMPRESSION: No active cardiopulmonary disease. Electronically Signed   By: Anner Crete M.D.   On: 08/07/2019 21:31   CT Angio Chest PE W and/or Wo Contrast  Result Date: 08/08/2019 CLINICAL DATA:  Shortness of breath, hypoxia EXAM: CT ANGIOGRAPHY CHEST WITH CONTRAST TECHNIQUE: Multidetector CT imaging of the chest was performed using the standard protocol during bolus administration of intravenous contrast. Multiplanar CT image reconstructions and MIPs were obtained to evaluate the vascular anatomy. CONTRAST:  131mL OMNIPAQUE IOHEXOL 350 MG/ML SOLN COMPARISON:  03/15/2019 FINDINGS: Cardiovascular: No filling defects in the pulmonary arteries to suggest pulmonary emboli. Heart is borderline in size. Coronary artery and aortic calcifications. Aorta normal caliber. Mediastinum/Nodes: No mediastinal, hilar, or axillary adenopathy. Trachea and esophagus are unremarkable. Thyroid unremarkable. Lungs/Pleura: Scarring in the right middle lobe, stable. Mild elevation of the right hemidiaphragm. No confluent opacities or effusions. Upper Abdomen: Marked splenomegaly, stable. Musculoskeletal: Chest wall soft tissues are unremarkable. No acute bony abnormality. Review of the MIP images confirms the above findings. IMPRESSION: No evidence of pulmonary embolus. Borderline heart size, coronary artery disease. No acute cardiopulmonary disease. Massive splenomegaly, stable. Aortic Atherosclerosis (ICD10-I70.0). Electronically Signed   By: Rolm Baptise M.D.   On: 08/08/2019 00:09    Procedures Procedures (including critical care time)  Medications Ordered in ED Medications  sodium  chloride (PF) 0.9 % injection (has no administration in time range)  iohexol (OMNIPAQUE) 350 MG/ML injection 100 mL (100 mLs Intravenous Contrast Given 08/07/19 2352)    ED  Course  I have reviewed the triage vital signs and the nursing notes.  Pertinent labs & imaging results that were available during my care of the patient were reviewed by me and considered in my medical decision making (see chart for details).  Clinical Course as of Aug 07 56  Wed Aug 08, 2019  0031 WBC(!!): 62.7 [RD]  0031 CT Angio Chest PE W and/or Wo Contrast [RD]  0031 Will check amb trial on pulse ox   [RD]  0051 Rechecked, no ongoing symptoms, will discharge home   [RD]    Clinical Course User Index [RD] Lucrezia Starch, MD   MDM Rules/Calculators/A&P                      74 year old male presenting to ER with dyspnea on exertion.  On exam, patient noted to be very well-appearing, lungs clear, no tachypnea.  Initial triage vitals noted to have mild hypoxia.  Patient was initially placed on supplemental nasal cannula but this was discontinued.  Given his history of DVT, obtain CTA chest which was negative for acute pulmonary embolism.  No evidence for pneumonia.  EKG without ischemic changes, troponin within normal limits, doubt ACS.  CBC notable for leukocytosis at baseline for his myelofibrosis.  Hemoglobin 7.4, has history of anemia.  No reported recent bleeding.  Completed extensive chart review, reviewed pulmonology notes, seem to have very similar complaints at time of the visit, pulmonology felt to be multifactorial.  On reassessment, patient did not have any ongoing symptoms, he ambulated on room air with 100% pulse ox.  Given work-up today, current clinical status, believe patient can be discharged and managed in the outpatient setting.  Recommend he follow-up with his hematologist, pulmonologist as well as primary care.    After the discussed management above, the patient was determined to be safe  for discharge.  The patient was in agreement with this plan and all questions regarding their care were answered.  ED return precautions were discussed and the patient will return to the ED with any significant worsening of condition.    Final Clinical Impression(s) / ED Diagnoses Final diagnoses:  Shortness of breath  Anemia, unspecified type    Rx / DC Orders ED Discharge Orders    None       Lucrezia Starch, MD 08/08/19 [phone]

## 2019-08-08 NOTE — Telephone Encounter (Signed)
Called and spoke to pt's wife, [name]. She states the pt is feeling better today than yesterday at the ED. Per pt's chart the ED note stated for pt to follow up with hematology, pulmonology, and PCP. Pt has an upcoming appt with hematology on 4/29. Offered an appt with an APP however, pt would like to wait to see Dr. Melvyn Novas. Appt made with Dr. Melvyn Novas on 5/13. Pt aware to seek emergency care if any new or worsening s/s. Nothing further needed at this time. Will sign off.

## 2019-08-08 NOTE — ED Notes (Signed)
Pt O2 stayed at 98% while walking on room air.

## 2019-08-08 NOTE — Discharge Instructions (Addendum)
Recommend following up with your hematologist, pulmonologist, as well as your primary care doctor.  If you have worsening shortness of breath, chest pain, episodes of passing out or other new concerning symptom, recommend return to ER for reassessment.

## 2019-08-14 ENCOUNTER — Telehealth: Payer: Self-pay | Admitting: Primary Care

## 2019-08-14 ENCOUNTER — Other Ambulatory Visit: Payer: Self-pay

## 2019-08-14 ENCOUNTER — Encounter: Payer: Self-pay | Admitting: Primary Care

## 2019-08-14 ENCOUNTER — Ambulatory Visit (INDEPENDENT_AMBULATORY_CARE_PROVIDER_SITE_OTHER): Payer: No Typology Code available for payment source | Admitting: Primary Care

## 2019-08-14 DIAGNOSIS — J449 Chronic obstructive pulmonary disease, unspecified: Secondary | ICD-10-CM

## 2019-08-14 DIAGNOSIS — Z87891 Personal history of nicotine dependence: Secondary | ICD-10-CM

## 2019-08-14 MED ORDER — IPRATROPIUM-ALBUTEROL 0.5-2.5 (3) MG/3ML IN SOLN: 3.0000 mL | Freq: Four times a day (QID) | RESPIRATORY_TRACT | 6 refills | Status: DC | PRN

## 2019-08-14 NOTE — Telephone Encounter (Signed)
ATC, NA 4/27

## 2019-08-14 NOTE — Progress Notes (Signed)
Virtual Visit via Telephone Note  I connected with Arick Maples on 08/14/19 at 10:00 AM EDT by telephone and verified that I am speaking with the correct person using two identifiers.  Location: Patient: Home Provider: Office   I discussed the limitations, risks, security and privacy concerns of performing an evaluation and management service by telephone and the availability of in person appointments. I also discussed with the patient that there may be a patient responsible charge related to this service. The patient expressed understanding and agreed to proceed.  Brief patient profile:  73 yowm quit smoking 06/07/19   [employer] [place] [employer] [phone] with new onset doe ? I just can't tell you when"  S/p admit   History of Present Illness: 74 year old male, former smoker. PMH significant for COPD, subdural hematoma in 2019, seizures, hypertension, anemia, myelofibrosis, splenomegaly, rt leg DVT s/p IVC filter in 2019, PTSD. Patient of Dr. Melvyn Novas, last seen on 07/30/19.   08/14/2019 Patient contacted today for ED follow-up. States that he is talking stiolto every morning. He has been in the ED twice for shortness of breath worse lying down this past week. He responded well with breathing treatement. CTA negative for acute PE, no active pulmonary disease. He does not have a nebulizer at home and would like one. O2 98% on RA at triage. BNP was 82 and troponin was negative.     Observations/Objective:  - Able to speak in full sentences; no shortness of breath, wheezing or cough noted   Cardiac testing: Echo 03/16/2019 2D echo with normal EF, grade 1 diastolic dysfunction and normal right ventricular failure or pulmonary hypertension.  Imaging: 08/07/19 CTA- No evidence of pulmonary embolus. Scarring right middle lobe. [place]line heart size, coronary artery disease, massive splenomegaly, stable. Aortic atherosclerosis.   Assessment and Plan:  COPD GOLD B: - Continue Stiolto two puffs once  daily  - Add ipratropium-albuterol (Duoneb) nebulizer q 6 hours prn shortness of breath/wheezing  - Referral for new nebulizer machine   Follow Up Instructions:  - Follow up with Dr. Melvyn Novas in May as scheduled    I discussed the assessment and treatment plan with the patient. The patient was provided an opportunity to ask questions and all were answered. The patient agreed with the plan and demonstrated an understanding of the instructions.   The patient was advised to call back or seek an in-person evaluation if the symptoms worsen or if the condition fails to improve as anticipated.  I provided 18 minutes of non-face-to-face time during this encounter.   Martyn Ehrich, NP

## 2019-08-14 NOTE — Patient Instructions (Addendum)
Orders: New nebulizer machine   Rx: Ipratropium/albuterol every 6 hours (sent to [facility])  Follow-up: Scheduled for follow-up with Dr. Melvyn Novas in May

## 2019-08-15 ENCOUNTER — Other Ambulatory Visit: Payer: Self-pay

## 2019-08-15 MED ORDER — IPRATROPIUM-ALBUTEROL 0.5-2.5 (3) MG/3ML IN SOLN: 3.0000 mL | Freq: Four times a day (QID) | RESPIRATORY_TRACT | 6 refills | Status: DC | PRN

## 2019-08-15 NOTE — Telephone Encounter (Signed)
Called and spoke with wife Hoyle Sauer per patients DPR we are resending prescription to [facility] in [place]. Nothing further needed at this time.

## 2019-08-15 NOTE — Progress Notes (Signed)
Called and spoke with wife Hoyle Sauer per patients DPR we are resending prescription to [facility] in [place]. Nothing further needed at this time.

## 2019-08-30 ENCOUNTER — Encounter: Payer: Self-pay | Admitting: Internal Medicine

## 2019-08-30 ENCOUNTER — Other Ambulatory Visit: Payer: Self-pay

## 2019-08-30 ENCOUNTER — Ambulatory Visit (INDEPENDENT_AMBULATORY_CARE_PROVIDER_SITE_OTHER): Payer: Medicare PPO | Admitting: Internal Medicine

## 2019-08-30 DIAGNOSIS — I824Y9 Acute embolism and thrombosis of unspecified deep veins of unspecified proximal lower extremity: Secondary | ICD-10-CM

## 2019-08-30 DIAGNOSIS — R0600 Dyspnea, unspecified: Secondary | ICD-10-CM | POA: Diagnosis not present

## 2019-08-30 DIAGNOSIS — J449 Chronic obstructive pulmonary disease, unspecified: Secondary | ICD-10-CM | POA: Diagnosis not present

## 2019-08-30 DIAGNOSIS — R0609 Other forms of dyspnea: Secondary | ICD-10-CM

## 2019-08-30 MED ORDER — ALBUTEROL SULFATE (2.5 MG/3ML) 0.083% IN NEBU: 2.5000 mg | INHALATION_SOLUTION | Freq: Four times a day (QID) | RESPIRATORY_TRACT | 12 refills | Status: AC | PRN

## 2019-08-30 NOTE — Assessment & Plan Note (Addendum)
Original dx 02/24/17:  U/s : Deep vein thrombosis involving the right femoral, popliteal, posterior  tibial, peroneal, and gastrocnemius veins. - IVC filter 1/20 /19    No longer having nose bleeds so advised to gradually resume prophylactic doses of eliquis and stop immediately if any active bleeding  Discussed in detail all the  indications, usual  risks and alternatives  relative to the benefits with patient who agrees to proceed with Rx as outlined.              Each maintenance medication was reviewed in detail including emphasizing most importantly the difference between maintenance and prns and under what circumstances the prns are to be triggered using an action plan format where appropriate.  Total time for H and P, chart review, counseling, reviewing smi device/  directly observing portions of ambulatory 02 saturation study/  and generating customized AVS unique to this office visit / charting = 30 min

## 2019-08-30 NOTE — Patient Instructions (Addendum)
Plan A = Automatic = Always=    Stiolto 2 puffs each am   Plan B = Backup (to supplement plan A, not to replace it) Only use your albuterol inhaler (PROAIR) as a rescue medication to be used if you can't catch your breath by resting or doing a relaxed purse lip breathing pattern.  - The less you use it, the better it will work when you need it. - Ok to use the inhaler up to 2 puffs  every 4 hours if you must but call for appointment if use goes up over your usual need - Don't leave home without it !!  (think of it like the spare tire for your car)   Plan C = Crisis (instead of Plan B but only if Plan B stops working) - only use your albuterol nebulizer if you first try Plan B and it fails to help > ok to use the nebulizer up to every 4 hours but if start needing it regularly call for immediate appointments  Try albuterol 15 min before an activity that you know would make you short of breath and see if it makes any difference and if makes none then don't take it after activity unless you can't catch your breath.      Ok to resume eliquis one half daily x one week then twice daily but hold for active bleeding  Please schedule a follow up office visit in 4 weeks, sooner if needed  with all medications /inhalers/ solutions in hand so we can verify exactly what you are taking. This includes all medications from all doctors and over the counters Add: PFTs on return

## 2019-08-30 NOTE — Assessment & Plan Note (Signed)
Stopped smoking 06/07/19 - 06/21/2019   try stiolto x 4 weeks samples then return to re-eval  - 06/21/2019   Walked RA x two laps =  approx 538ft @ avg pace - stopped due to end of study  with sats of 97% % at the end of the study. - 07/30/2019  After extensive coaching inhaler device,  effectiveness =    90% with smi > continue stiolto  - 08/14/2019 Frequent ED visit in April for SOB. Add ipratropium-albuterol neb. Cont stiolto. CTA neg. BNP normal.    Flares ? Truly copd ? As very complicated situation with severe anemia also contributing to symptoms.    If really having aecopd needs add ICS but none available in Respimat so would need to change to breztri or trelgy depending on preference and insurance copays.  Discussed: I spent extra time with pt today reviewing appropriate use of albuterol for prn use on exertion with the following points: 1) saba is for relief of sob that does not improve by walking a slower pace or resting but rather if the pt does not improve after trying this first. 2) If the pt is convinced, as many are, that saba helps recover from activity faster then it's easy to tell if this is the case by re-challenging : ie stop, take the inhaler, then p 5 minutes try the exact same activity (intensity of workload) that just caused the symptoms and see if they are substantially diminished or not after saba 3) if there is an activity that reproducibly causes the symptoms, try the saba 15 min before the activity on alternate days   If in fact the saba really does help, then fine to continue to use it prn but advised may need to look closer at the maintenance regimen being used to achieve better control of airways disease with exertion.   >> stop sama as on lama and risk anticholinergic side effects.  >>> f/u 4-6 weeks with pfts

## 2019-08-30 NOTE — Assessment & Plan Note (Addendum)
Echo 03/16/2019 2D echo with normal EF, grade 1 diastolic dysfunction and normal right ventricular failure or pulmonary hypertension. -  08/30/2019   Walked RA  2 laps @ approx 267ft each @ moderate pace  stopped due to end of study / sob but sats 99%  Not really clear he is flow limited, will try saba prior to ex rather than afterwards to see if there really is a benefit as per copd a/p   Will return for full pfts to sort out

## 2019-08-30 NOTE — Progress Notes (Signed)
Carlos Vaughn, male    DOB: 1946-01-27,    MRN: [id number]   Brief patient profile:  74 yowm quit smoking 06/07/19   [employer] [place] [employer] [phone] with new onset doe ? I just can't tell you when"  S/p admit :  Admit date: 03/15/2019 Discharge date: 03/17/2019   Discharge Diagnoses:  Principal Problem:   Shortness of breath  Active Problems:   Anticoagulated for Rt Leg DVT   Seizures (HCC)   Leukocytosis   Thrombocytopenia (HCC)   Anemia of chronic disease   Symptomatic anemia   Bilateral lower extremity edema   COPD (chronic obstructive pulmonary disease) (HCC)   Myelofibrosis (HCC)   Bilateral leg edema   Brief narrative/HPI 74 year old male with myelofibrosis with significant splenomegaly, COPD, history of subdural hematoma in 2019, seizures, hypertension, hyperlipidemia, PTSD and 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 Reports his Eliquis dose was recently reduced by his PCP but has not missed any doses. Patient found to have drop in hemoglobin to 6.8, stool for Hemoccult was negative. Admitted for further management.  Hospital course   Principal problem Shortness of breath [facility]) Likely due to symptomatic anemia.COVID-19 testing negative. Received 1 unit PRBC with improvement in hemoglobin to 7.4, dropped again to 6.3 am of dc/ and .  Received second unit  Dyspnea and leg swelling have markedly improved with IV Lasix.  rx Lasix 40 mg daily as needed for dyspnea and leg swellings. 2D echo with normal EF, grade 1 diastolic dysfunction and normal right ventricular failure or pulmonary hypertension.  Does not want to stay further in the hospital or get repeat H&H.  Feels better and wants to go home.  I think he is stable to be discharged home.  Needs close monitoring of his labs as outpatient.  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 CT of the abdomen does not comment on displaced IVC filter.   2D echo unremarkable.Continue Eliquis.   Pancytopenia with myelofibrosis and massive splenomegaly Has significant leukocytosis, anemia and thrombocytopenia secondary to myelofibrosis. Discussed with hematology on-call Dr. Alvy Bimler who recommends transfusing as needed and follow-up with his hematologist at [facility]. Patient given 2 unit PRBC.  He did not want to wait for repeat H&H.  Has low platelets as well.  Eliquis has been continued at low-dose.  Ruxolitinib should be continued.  Follow repeat lab during his visit with hematologist next week.   COPD (chronic obstructive pulmonary disease) (HCC) Stable. Continue home inhaler.  No signs of cor pulmonale on echo.  Hypothyroidism Continue Synthroid      History of Present Illness  06/21/2019  Pulmonary/ 1st office eval/[name]  Chief Complaint  Patient presents with  . Pulmonary Consult    Self referral.  Pt c/o DOE for the past 3-4 wks. He gets winded walking room to room. He uses his albuterol inhaler about 4 x per wk.   Dyspnea:  MMRC3 = can't walk 100 yards even at a slow pace at a flat grade s stopping due to sob eg can't do 3 aisles at grocery store Cough: minimal Sleep: no resp symptoms  SABA use: every 4 hours since last in [facility] ER  rec .Plan A = Automatic = Always=    Stiolto 2puff each am  Work on inhaler technique:  Plan B = Backup (to supplement plan  A, not to replace it) Only use your albuterol inhaler as a rescue medication  Please schedule a follow up office visit in 4 weeks, sooner if needed  with all medications   07/30/2019  f/u ov/[name] re: doe no real change on stiolto  Chief Complaint  Patient presents with  . Follow-up    Breathing is about the same. He is bothered by nosebleeds and plans to see ENT. He is using his albuterol inhaler on average  once per wk.   Dyspnea:  Very inactive /mailbox and back slt incline all he can manage  Cough: none Sleeping: fine 30 degrees x one week helps breathing to sit up  SABA use: once a week - note much less since started stiolto 02: none  rec If actively bleeding hold the eliquis (apixaban) until it stops then resume. Talk to the doctor who prescribed your gabapentin as it may contributing to your swelling as can prazosin  Please schedule a follow up visit in 3 months but call sooner if needed  with all medications  PFTs on return.    08/30/2019  f/u ov/[name] re:  Copd group B  stiolto 2 pffs and multiple atrovent rx  Chief Complaint  Patient presents with  . Follow-up    SOB, in hospital, albuterol nebulizer helped   Dyspnea:  mb and back more limited by  energy than sob  Cough: none Sleeping:  <30 degrees  SABA use: too much alb/atrovent 02:none and does not check sats  Sometimes hb  No obvious day to day or daytime variability or assoc excess/ purulent sputum or mucus plugs or hemoptysis or cp or chest tightness, subjective wheeze or overt sinus  symptoms.   Sleeping  without nocturnal  or early am exacerbation  of respiratory  c/o's or need for noct saba. Also denies any obvious fluctuation of symptoms with weather or environmental changes or other aggravating or alleviating factors except as outlined above   No unusual exposure hx or h/o childhood pna/ asthma or knowledge of premature birth.  Current Allergies, Complete Past Medical History, Past Surgical History, Family History, and Social History were reviewed in [employer] record.  ROS  The following are not active complaints unless bolded Hoarseness, sore throat, dysphagia, dental problems, itching, sneezing,  nasal congestion or discharge of excess mucus or purulent secretions, ear ache,   fever, chills, sweats, unintended wt loss or wt gain, classically pleuritic or exertional cp,  orthopnea pnd or  arm/hand swelling  or leg swelling, presyncope, palpitations, abdominal pain, anorexia, nausea, vomiting, diarrhea  or change in bowel habits or change in bladder habits, change in stools or change in urine, dysuria, hematuria,  rash, arthralgias, visual complaints, headache, numbness, weakness or ataxia or problems with walking or coordination,  change in mood or  memory.        Current Meds  Medication Sig  . albuterol (VENTOLIN HFA) 108 (90 Base) MCG/ACT inhaler Inhale 1-2 puffs into the lungs every 6 (six) hours as needed for wheezing or shortness of breath.  [name] CALCIUM-MAGNESIUM-ZINC PO Take 3 tablets by mouth daily.  . Ipratropium-Albuterol (COMBIVENT RESPIMAT) 20-100 MCG/ACT AERS respimat Inhale 1 puff into the lungs every 6 (six) hours.  [name] ipratropium-albuterol (DUONEB) 0.5-2.5 (3) MG/3ML SOLN Take 3 mLs by nebulization every 6 (six) hours as needed.  [name] levothyroxine (SYNTHROID) 75 MCG tablet Take 75 mcg by mouth daily before breakfast.  . Multiple Vitamin (MULTIVITAMIN) tablet Take 1 tablet by mouth daily.  [name]  Potassium 99 MG TABS Take 1 tablet by mouth daily.  . prazosin (MINIPRESS) 2 MG capsule Take 2 mg by mouth at bedtime.   . ruxolitinib phosphate (JAKAFI) 10 MG tablet Take 10-15 mg by mouth 2 (two) times daily. Take 10mg  in am and 5mg  in pm.  . Tiotropium Bromide-Olodaterol (STIOLTO RESPIMAT) 2.5-2.5 MCG/ACT AERS Inhale 2 puffs into the lungs daily.  . traZODone (DESYREL) 150 MG tablet Take 150 mg by mouth at bedtime.  . [DISCONTINUED] furosemide (LASIX) 40 MG tablet Take 1 tablet (40 mg total) by mouth daily as needed for fluid. (Patient taking differently: Take 40 mg by mouth daily. )                       Past Medical History:  Diagnosis Date  . Anxiety   . COPD (chronic obstructive pulmonary disease) ([place])   . Depression   . DVT (deep venous thrombosis) ([place])   . Hyperlipemia   . Melanoma ([place])   . Myelofibrosis ([place])   . PTSD (post-traumatic stress disorder)         Objective:    08/30/2019        151 07/30/19 153 lb 9.6 oz (69.7 kg)  06/21/19 149 lb (67.6 kg)  06/18/19 152 lb (68.9 kg)    Chronically ill [facility]   Vital signs reviewed  08/30/2019  - Note at rest 02 sats  100% on RA      HEENT : pt wearing mask not removed for exam due to covid - 19 concerns.   NECK :  without JVD/Nodes/TM/ nl carotid upstrokes bilaterally   LUNGS: no acc muscle use,  Min barrel  contour chest wall with bilateral  slightly decreased bs s audible wheeze and  without cough on insp or exp maneuvers and min  Hyperresonant  to  percussion bilaterally     CV:  RRR  no s3 or murmur or increase in P2, and trace edema both LEs  ABD:  Mod distended/ splenomegaly  with pos end  insp Hoover's  in the supine position. No bruits or organomegaly appreciated, bowel sounds nl  MS:   Nl gait/  ext warm without deformities, calf tenderness, cyanosis or clubbing No obvious joint restrictions   SKIN: warm and dry without lesions    NEURO:  alert, approp, nl sensorium with  no motor or cerebellar deficits apparent.          I personally reviewed images and agree with radiology impression as follows:   Chest CTa  08/07/19  No evidence of pulmonary embolus. Borderline heart size, coronary artery disease. No acute cardiopulmonary disease. Massive splenomegaly, stable. Aortic Atherosclerosis (ICD10-I70.0).          Assessment

## 2019-10-09 ENCOUNTER — Ambulatory Visit: Payer: No Typology Code available for payment source | Admitting: Internal Medicine

## 2019-10-26 ENCOUNTER — Other Ambulatory Visit (HOSPITAL_COMMUNITY): Payer: No Typology Code available for payment source

## 2019-10-29 ENCOUNTER — Ambulatory Visit: Payer: No Typology Code available for payment source | Admitting: Internal Medicine

## 2019-10-29 ENCOUNTER — Ambulatory Visit (INDEPENDENT_AMBULATORY_CARE_PROVIDER_SITE_OTHER): Payer: Medicare PPO | Admitting: Primary Care

## 2019-10-29 ENCOUNTER — Ambulatory Visit (INDEPENDENT_AMBULATORY_CARE_PROVIDER_SITE_OTHER): Payer: Medicare PPO | Admitting: Internal Medicine

## 2019-10-29 ENCOUNTER — Encounter: Payer: Self-pay | Admitting: Primary Care

## 2019-10-29 ENCOUNTER — Other Ambulatory Visit: Payer: Self-pay

## 2019-10-29 DIAGNOSIS — J449 Chronic obstructive pulmonary disease, unspecified: Secondary | ICD-10-CM | POA: Diagnosis not present

## 2019-10-29 LAB — PULMONARY FUNCTION TEST
DL/VA % pred: 59 %
DL/VA: 2.37 ml/min/mmHg/L
DLCO cor % pred: 45 %
DLCO cor: 10.7 ml/min/mmHg
DLCO unc % pred: 45 %
DLCO unc: 10.7 ml/min/mmHg
FEF 25-75 Post: 0.89 L/sec
FEF 25-75 Pre: 0.67 L/sec
FEF2575-%Change-Post: 32 %
FEF2575-%Pred-Post: 44 %
FEF2575-%Pred-Pre: 33 %
FEV1-%Change-Post: 9 %
FEV1-%Pred-Post: 57 %
FEV1-%Pred-Pre: 52 %
FEV1-Post: 1.6 L
FEV1-Pre: 1.47 L
FEV1FVC-%Change-Post: 0 %
FEV1FVC-%Pred-Pre: 79 %
FEV6-%Change-Post: 9 %
FEV6-%Pred-Post: 75 %
FEV6-%Pred-Pre: 69 %
FEV6-Post: 2.72 L
FEV6-Pre: 2.49 L
FEV6FVC-%Change-Post: 0 %
FEV6FVC-%Pred-Post: 104 %
FEV6FVC-%Pred-Pre: 103 %
FVC-%Change-Post: 8 %
FVC-%Pred-Post: 72 %
FVC-%Pred-Pre: 66 %
FVC-Post: 2.78 L
FVC-Pre: 2.57 L
Post FEV1/FVC ratio: 58 %
Post FEV6/FVC ratio: 98 %
Pre FEV1/FVC ratio: 57 %
Pre FEV6/FVC Ratio: 97 %

## 2019-10-29 NOTE — Progress Notes (Signed)
Virtual Visit via Telephone Note  I connected with Carlos Vaughn on 10/29/19 at  2:30 PM EDT by telephone and verified that I am speaking with the correct person using two identifiers.  Location: Patient: Home Provider: Office   I discussed the limitations, risks, security and privacy concerns of performing an evaluation and management service by telephone and the availability of in person appointments. I also discussed with the patient that there may be a patient responsible charge related to this service. The patient expressed understanding and agreed to proceed.   Brief patient profile:  73 yowm quit smoking 06/07/19   [employer] [place] [employer] [phone] with new onset doe ? I just can't tell you when"  S/p admit   History of Present Illness: 74 year old male, former smoker. PMH significant for COPD, subdural hematoma in 2019, seizures, hypertension, anemia, myelofibrosis, splenomegaly, rt leg DVT s/p IVC filter in 2019, PTSD. Patient of Carlos Vaughn, last seen on 08/30/19. Recommend fu in 4-6 weeks with PFTs.     Previous [facility] pulmonary encounters: 07/30/19- Carlos Vaughn Follow-up- Breathing is about the same, He is botherred by nosebleed and plans to seen ENT. He is using his albuterol inhaler on average once per week Dyspnea:  Very inactive /mailbox and back slt incline all he can manage  Cough: none Sleeping: fine 30 degrees x one week helps breathing to sit up  SABA use: once a week - note much less since started stiolto 02: none  Rec If actively bleeding hold the eliquis (apixaban) until it stops then resume. Talk to the doctor who prescribed your gabapentin as it may contributing to your swelling as can prazosin  Please schedule a follow up visit in 3 months but call sooner if needed  with all medications  PFTs on return.  4/27/2021[name], NP Patient contacted today for ED follow-up. States that he is talking stiolto every morning. He has been in the ED twice for shortness of breath worse  lying down this past week. He responded well with breathing treatement. CTA negative for acute PE, no active pulmonary disease. He does not have a nebulizer at home and would like one. O2 98% on RA at triage. BNP was 82 and troponin was negative.    08/30/19- Carlos Vaughn Chief complaint: Shortness of breath, in hospital, albuterol nebulizer helped Dyspnea:  mb and back more limited by  energy than sob  Cough: none Sleeping:  <30 degrees  SABA use: too much alb/atrovent 02:none and does not check sats  Sometimes hb  Flares ? Truly copd ? As very complicated situation with severe anemia also contributing to symptoms. If really having aecopd needs add ICS but none available in Respimat so would need to change to breztri or trelgy depending on preference and insurance copays. If in fact the saba really does help, then fine to continue to use it prn but advised may need to look closer at the maintenance regimen being used to achieve better control of airways disease with exertion.   >> stop sama as on lama and risk anticholinergic side effects.   10/29/2019- Interim hx Patient contacted today for 2 month follow-up/review PFTs today.  Breathing is about the same. Continues to use Stiolto Respmat 2 puffs once daily in the morning. Uses albuterol twice a day at most. No recent exacerbations. No cough or mucus productive. Wife states that he is very inactive, decreased energy. Carlos Vaughn with hematology told him to continue to Hold eliquis d/t low plt.  He gets blood/platelet transfusions through [facility] in [place] twice a week.    Observations/Objective:  - Able to speak in mostly full sentences; no overt shortness of breath, wheezing or cough  Cardiac testing: Echo 03/16/2019- Normal EF, grade 1 diastolic dysfunction and normal right ventricular failure or pulmonary hypertension.  Imaging: 08/07/19 CTA- No evidence of pulmonary embolus. Scarring right middle lobe. [place] heart size, coronary  artery disease, massive splenomegaly, stable. Aortic atherosclerosis.   PFTs: FVC 2.78 (72%), FEV 1 1.60 (57%), ratio 58, TLC 76%, DLCOunc 10.70 (45%) No significant BD response, mid-flow reversibility   Assessment and Plan:  Copd Gold B:  - PFTs today showed moderately severe obstructive lung disease with moderate diffusion defect (this was not correlated with his Hgb and he has hx anemia) - 10/29/2019 FEV1 1.60 (57%), RATIO 58 - Continue Stiolto Respimat 2 puffs once daily; albuterol 2 puffs every 4 hours for breakthrough shortness of breath/wheezing  - If experiences increased frequency in acute exacerbation of COPD then need to add ICS component, would recommend changing to [facility] or Trelegy  DVT: - Eliquis on hold d/t thrombocytopenia   Follow Up Instructions:  - Follow-up in 6-8 weeks in office with medications in hand   I discussed the assessment and treatment plan with the patient. The patient was provided an opportunity to ask questions and all were answered. The patient agreed with the plan and demonstrated an understanding of the instructions.   The patient was advised to call back or seek an in-person evaluation if the symptoms worsen or if the condition fails to improve as anticipated.  I provided 22 minutes of non-face-to-face time during this encounter.   Carlos Ehrich, NP

## 2019-10-29 NOTE — Progress Notes (Signed)
PFT done today. 

## 2019-10-29 NOTE — Patient Instructions (Addendum)
PFTs today showed moderate-severe COPD (GOLD B/C)  Copd gold B: - Continue Stiolto 2 puffs once daily; use albuterol 2 puffs every 4 hours - If having increased acute exacerbation of COPD then need to add ICS component, would recommend changing to [facility] or Trelegy  DVT: - Continue hold for Eliquis  Follow Up Instructions: - Follow-up in 6-8 weeks in office with Dr. Melvyn Novas or APP with medications in hand

## 2020-01-16 ENCOUNTER — Other Ambulatory Visit: Payer: Self-pay

## 2020-01-16 ENCOUNTER — Encounter: Payer: Self-pay | Admitting: Internal Medicine

## 2020-01-16 ENCOUNTER — Ambulatory Visit (INDEPENDENT_AMBULATORY_CARE_PROVIDER_SITE_OTHER): Payer: Medicare PPO | Admitting: Internal Medicine

## 2020-01-16 DIAGNOSIS — J449 Chronic obstructive pulmonary disease, unspecified: Secondary | ICD-10-CM | POA: Diagnosis not present

## 2020-01-16 DIAGNOSIS — F17210 Nicotine dependence, cigarettes, uncomplicated: Secondary | ICD-10-CM

## 2020-01-16 DIAGNOSIS — Z87891 Personal history of nicotine dependence: Secondary | ICD-10-CM | POA: Insufficient documentation

## 2020-01-16 NOTE — Assessment & Plan Note (Signed)
4-5 min discussion re active cigarette smoking in addition to office E&M  Ask about tobacco use:   ongoing Advise quitting   I emphasized that although we never turn away smokers from the pulmonary clinic, we do ask that they understand that the recommendations that we make  won't work nearly as well in the presence of continued cigarette exposure (esp in terms of controlling cough)  In fact, we may very well  reach a point where we can't promise to help the patient if he/she can't quit smoking. (We can and will promise to try to help, we just can't promise what we recommend will really work)  Assess willingness:  Not committed at this point Assist in quit attempt:  Per PCP when ready Arrange follow up:   Follow up per Primary Care planned

## 2020-01-16 NOTE — Progress Notes (Signed)
Carlos Vaughn, male    DOB: 1946/04/12,    MRN: [id number]   Brief patient profile:  43 yowm active smoker   [employer] [place] [employer] [id number] with new onset doe ? I just can't tell you when"  S/p admit :  Admit date: 03/15/2019 Discharge date: 03/17/2019   Discharge Diagnoses:  Principal Problem:   Shortness of breath  Active Problems:   Anticoagulated for Rt Leg DVT   Seizures (HCC)   Leukocytosis   Thrombocytopenia (HCC)   Anemia of chronic disease   Symptomatic anemia   Bilateral lower extremity edema   COPD (chronic obstructive pulmonary disease) (HCC)   Myelofibrosis (HCC)   Bilateral leg edema   Brief narrative/HPI 74 year old male with myelofibrosis with significant splenomegaly, COPD, history of subdural hematoma in 2019, seizures, hypertension, hyperlipidemia, PTSD and 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 Reports his Eliquis dose was recently reduced by his PCP but has not missed any doses. Patient found to have drop in hemoglobin to 6.8, stool for Hemoccult was negative. Admitted for further management.  Hospital course   Principal problem Shortness of breath [facility]) Likely due to symptomatic anemia.COVID-19 testing negative. Received 1 unit PRBC with improvement in hemoglobin to 7.4, dropped again to 6.3 am of dc/ and .  Received second unit  Dyspnea and leg swelling have markedly improved with IV Lasix.  rx Lasix 40 mg daily as needed for dyspnea and leg swellings. 2D echo with normal EF, grade 1 diastolic dysfunction and normal right ventricular failure or pulmonary hypertension.  Does not want to stay further in the hospital or get repeat H&H.  Feels better and wants to go home.  I think he is stable to be discharged home.  Needs close monitoring of his labs as outpatient.  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 CT of the abdomen does not comment on displaced IVC filter.   2D echo unremarkable.Continue Eliquis.   Pancytopenia with myelofibrosis and massive splenomegaly Has significant leukocytosis, anemia and thrombocytopenia secondary to myelofibrosis. Discussed with hematology on-call Dr. Alvy Bimler who recommends transfusing as needed and follow-up with his hematologist at [facility]. Patient given 2 unit PRBC.  He did not want to wait for repeat H&H.  Has low platelets as well.  Eliquis has been continued at low-dose.  Ruxolitinib should be continued.  Follow repeat lab during his visit with hematologist next week.   COPD (chronic obstructive pulmonary disease) (HCC) Stable. Continue home inhaler.  No signs of cor pulmonale on echo.  Hypothyroidism Continue Synthroid      History of Present Illness  06/21/2019  Pulmonary/ 1st office eval/Kandyce Dieguez  Chief Complaint  Patient presents with  . Pulmonary Consult    Self referral.  Pt c/o DOE for the past 3-4 wks. He gets winded walking room to room. He uses his albuterol inhaler about 4 x per wk.   Dyspnea:  MMRC3 = can't walk 100 yards even at a slow pace at a flat grade s stopping due to sob eg can't do 3 aisles at grocery store Cough: minimal Sleep: no resp symptoms  SABA use: every 4 hours since last in [facility] ER  rec .Plan A = Automatic = Always=    Stiolto 2puff each am  Work on inhaler technique:  Plan B = Backup (to supplement plan A,  not to replace it) Only use your albuterol inhaler as a rescue medication  Please schedule a follow up office visit in 4 weeks, sooner if needed  with all medications   07/30/2019  f/u ov/Surah Pelley re: doe no real change on stiolto  Chief Complaint  Patient presents with  . Follow-up    Breathing is about the same. He is bothered by nosebleeds and plans to see ENT. He is using his albuterol inhaler on average once per wk.   Dyspnea:   Very inactive /mailbox and back slt incline all he can manage  Cough: none Sleeping: fine 30 degrees x one week helps breathing to sit up  SABA use: once a week - note much less since started stiolto 02: none  rec If actively bleeding hold the eliquis (apixaban) until it stops then resume. Talk to the doctor who prescribed your gabapentin as it may contributing to your swelling as can prazosin  Please schedule a follow up visit in 3 months but call sooner if needed  with all medications  PFTs on return.    08/30/2019  f/u ov/Trisa Cranor re:  Copd group B  stiolto 2 pffs and multiple atrovent rx  Chief Complaint  Patient presents with  . Follow-up    SOB, in hospital, albuterol nebulizer helped   Dyspnea:  mb and back more limited by  energy than sob  Cough: none Sleeping:  <30 degrees  SABA use: too much alb/atrovent 02:none and does not check sats  Sometimes hb rec Plan A = Automatic = Always=    Stiolto 2 puffs each am  Plan B = Backup (to supplement plan A, not to replace it) Only use your albuterol inhaler (PROAIR) as a rescue medication Plan C = Crisis (instead of Plan B but only if Plan B stops working) - only use your albuterol nebulizer if you first try Plan B  Try albuterol 15 min before an activity     Ok to resume eliquis one half daily x one week then twice daily but hold for active bleeding Please schedule a follow up office visit in 4 weeks,    01/16/2020  f/u ov/Terilynn Buresh re: GOLD 2/ group B  maint on stiolto /saba when over does it, never rechallenges  Chief Complaint  Patient presents with  . Follow-up   Dyspnea:  mb easy now  Cough: rattle smoker's pattern worse in am min mucoid production  Sleeping: electric bed about 30 degrees  SABA use: as above  02: none   No obvious day to day or daytime variability or assoc  purulent sputum or mucus plugs or hemoptysis or cp or chest tightness, subjective wheeze or overt sinus or hb symptoms.   Sleeping as above  without  nocturnal  or early am exacerbation  of respiratory  c/o's or need for noct saba. Also denies any obvious fluctuation of symptoms with weather or environmental changes or other aggravating or alleviating factors except as outlined above   No unusual exposure hx or h/o childhood pna/ asthma or knowledge of premature birth.  Current Allergies, Complete Past Medical History, Past Surgical History, Family History, and Social History were reviewed in [employer] record.  ROS  The following are not active complaints unless bolded Hoarseness, sore throat, dysphagia, dental problems, itching, sneezing,  nasal congestion or discharge of excess mucus or purulent secretions, ear ache,   fever, chills, sweats, unintended wt loss or wt gain, classically pleuritic or exertional cp,  Orthopnea chronically s  pnd or arm/hand swelling  or leg swelling, presyncope, palpitations, abdominal pain, anorexia, nausea, vomiting, diarrhea  or change in bowel habits or change in bladder habits, change in stools or change in urine, dysuria, hematuria,  rash, arthralgias, visual complaints, headache, numbness, weakness or ataxia or problems with walking or coordination,  change in mood or  memory.        Current Meds  Medication Sig  . albuterol (PROVENTIL) (2.5 MG/3ML) 0.083% nebulizer solution Take 3 mLs (2.5 mg total) by nebulization every 6 (six) hours as needed for wheezing or shortness of breath.  [name] albuterol (VENTOLIN HFA) 108 (90 Base) MCG/ACT inhaler Inhale 1-2 puffs into the lungs every 6 (six) hours as needed for wheezing or shortness of breath.  [name] CALCIUM-MAGNESIUM-ZINC PO Take 3 tablets by mouth daily.  . furosemide (LASIX) 40 MG tablet Take 40 mg by mouth as directed. Takes prn for "swelling"  . levothyroxine (SYNTHROID) 75 MCG tablet Take 75 mcg by mouth daily before breakfast.  . Multiple Vitamin (MULTIVITAMIN) tablet Take 1 tablet by mouth daily.  . Potassium 99 MG TABS Take 1 tablet by  mouth daily.  . prazosin (MINIPRESS) 2 MG capsule Take 2 mg by mouth at bedtime.   . ruxolitinib phosphate (JAKAFI) 10 MG tablet Take 10-15 mg by mouth 2 (two) times daily. Take 10mg  in am and 5mg  in pm.  . Tiotropium Bromide-Olodaterol (STIOLTO RESPIMAT) 2.5-2.5 MCG/ACT AERS Inhale 2 puffs into the lungs daily.  . traZODone (DESYREL) 150 MG tablet Take 150 mg by mouth at bedtime.                      Past Medical History:  Diagnosis Date  . Anxiety   . COPD (chronic obstructive pulmonary disease) ([place])   . Depression   . DVT (deep venous thrombosis) ([place])   . Hyperlipemia   . Melanoma ([place])   . Myelofibrosis ([place])   . PTSD (post-traumatic stress disorder)        Objective:     01/16/2020        141 08/30/2019        151 07/30/19 153 lb 9.6 oz (69.7 kg)  06/21/19 149 lb (67.6 kg)  06/18/19 152 lb (68.9 kg)    Vital signs reviewed  01/16/2020  - Note at rest 02 sats  98% on RA       ambulatory somber wm nad / slt smoker's rattle  HEENT : pt wearing mask not removed for exam due to covid - 19 concerns.    NECK :  without JVD/Nodes/TM/ nl carotid upstrokes bilaterally   LUNGS: no acc muscle use,  Mild barrel  contour chest wall with bilateral  Distant bs s audible wheeze and  without cough on insp or exp maneuvers  and mild  Hyperresonant  to  percussion bilaterally     CV:  RRR  no s3 or murmur or increase in P2, and no edema   ABD:  soft and nontender with pos end  insp Hoover's  in the supine position. No bruits  appreciated, bowel sounds nl - splenomegaly noted   MS:   Nl gait/  ext warm without deformities, calf tenderness, cyanosis or clubbing No obvious joint restrictions   SKIN: warm and dry without lesions    NEURO:  alert, approp, nl sensorium with  no motor or cerebellar deficits apparent.           I personally reviewed images and agree with radiology  impression as follows:   Chest CTa  08/07/19 No evidence of pulmonary embolus.  Borderline  heart size, coronary artery disease.  No acute cardiopulmonary disease.  Massive splenomegaly, stable.       Assessment

## 2020-01-16 NOTE — Assessment & Plan Note (Addendum)
Active smoker  - 06/21/2019   try stiolto x 4 weeks samples then return to re-eval  - 06/21/2019   Walked RA x two laps =  approx 584ft @ avg pace - stopped due to end of study  with sats of 97% % at the end of the study. - 07/30/2019  After extensive coaching inhaler device,  effectiveness =    90% with smi > continue stiolto  - 08/14/2019 Frequent ED visit in April for SOB. Add ipratropium-albuterol neb. Cont stiolto. CTA neg. BNP normal.   - PFT's 10/29/19  FEV1 1.60 (57 % ) ratio 0.58  p 9 % improvement from saba p Stiolto prior to study with DLCO  10.70 (45%) corrects to 2.37 (59%)  for alv volume and FV curve classic concavity    Pt is Group B in terms of symptom/risk and laba/lama therefore appropriate rx at this point >>>  Continue stiolto plus prn saba  I spent extra time with pt today reviewing appropriate use of albuterol for prn use on exertion with the following points: 1) saba is for relief of sob that does not improve by walking a slower pace or resting but rather if the pt does not improve after trying this first. 2) If the pt is convinced, as many are, that saba helps recover from activity faster then it's easy to tell if this is the case by re-challenging : ie stop, take the inhaler, then p 5 minutes try the exact same activity (intensity of workload) that just caused the symptoms and see if they are substantially diminished or not after saba 3) if there is an activity that reproducibly causes the symptoms, try the saba 15 min before the activity on alternate days   If in fact the saba really does help, then fine to continue to use it prn but advised may need to look closer at the maintenance regimen being used to achieve better control of airways disease with exertion.           Each maintenance medication was reviewed in detail including emphasizing most importantly the difference between maintenance and prns and under what circumstances the prns are to be triggered using an action  plan format where appropriate.  Total time for H and P, chart review, counseling, teaching device and generating customized AVS unique to this office visit / charting = 23 min

## 2020-01-16 NOTE — Patient Instructions (Addendum)
Plan A = Automatic = Always=  Stiolto 2 puffs each am   Plan B = Backup (to supplement plan A, not to replace it) Only use your albuterol inhaler (PROAIR) as a rescue medication to be used if you can't catch your breath by resting or doing a relaxed purse lip breathing pattern.  - The less you use it, the better it will work when you need it. - Ok to use the inhaler up to 2 puffs  every 4 hours if you must but call for appointment if use goes up over your usual need - Don't leave home without it !!  (think of it like the spare tire for your car)   Plan C = Crisis (instead of Plan B but only if Plan B stops working) - only use your albuterol nebulizer if you first try Plan B and it fails to help > ok to use the nebulizer up to every 4 hours but if start needing it regularly call for immediate appointments  Try albuterol 15 min before an activity that you know would make you short of breath and see if it makes any difference and if makes none then don't take it after activity unless you can't catch your breath.   Best cough medication  >  Breathe clear air and take mucinex dm 1200 mg  Every 12 hours    Please schedule a follow up visit in 12 months but call sooner if needed

## 2020-04-05 ENCOUNTER — Other Ambulatory Visit: Payer: Self-pay

## 2020-04-05 ENCOUNTER — Emergency Department (HOSPITAL_COMMUNITY): Payer: No Typology Code available for payment source

## 2020-04-05 ENCOUNTER — Encounter (HOSPITAL_COMMUNITY): Payer: Self-pay | Admitting: Emergency Medicine

## 2020-04-05 ENCOUNTER — Inpatient Hospital Stay (HOSPITAL_COMMUNITY)
Admission: EM | Admit: 2020-04-05 | Discharge: 2020-04-10 | DRG: 291 | Disposition: A | Discharge status: Home / Self Care | Payer: No Typology Code available for payment source | Attending: Internal Medicine | Admitting: Internal Medicine

## 2020-04-05 DIAGNOSIS — Z86718 Personal history of other venous thrombosis and embolism: Secondary | ICD-10-CM

## 2020-04-05 DIAGNOSIS — J449 Chronic obstructive pulmonary disease, unspecified: Secondary | ICD-10-CM | POA: Diagnosis present

## 2020-04-05 DIAGNOSIS — D638 Anemia in other chronic diseases classified elsewhere: Secondary | ICD-10-CM | POA: Diagnosis present

## 2020-04-05 DIAGNOSIS — F4310 Post-traumatic stress disorder, unspecified: Secondary | ICD-10-CM | POA: Diagnosis present

## 2020-04-05 DIAGNOSIS — I6200 Nontraumatic subdural hemorrhage, unspecified: Secondary | ICD-10-CM

## 2020-04-05 DIAGNOSIS — J810 Acute pulmonary edema: Secondary | ICD-10-CM | POA: Diagnosis not present

## 2020-04-05 DIAGNOSIS — J9811 Atelectasis: Secondary | ICD-10-CM | POA: Diagnosis present

## 2020-04-05 DIAGNOSIS — Z9911 Dependence on respirator [ventilator] status: Secondary | ICD-10-CM | POA: Diagnosis not present

## 2020-04-05 DIAGNOSIS — F17210 Nicotine dependence, cigarettes, uncomplicated: Secondary | ICD-10-CM | POA: Diagnosis present

## 2020-04-05 DIAGNOSIS — Z79899 Other long term (current) drug therapy: Secondary | ICD-10-CM

## 2020-04-05 DIAGNOSIS — J9601 Acute respiratory failure with hypoxia: Secondary | ICD-10-CM

## 2020-04-05 DIAGNOSIS — Z781 Physical restraint status: Secondary | ICD-10-CM

## 2020-04-05 DIAGNOSIS — R0602 Shortness of breath: Secondary | ICD-10-CM | POA: Diagnosis present

## 2020-04-05 DIAGNOSIS — I4891 Unspecified atrial fibrillation: Secondary | ICD-10-CM | POA: Diagnosis not present

## 2020-04-05 DIAGNOSIS — D7581 Myelofibrosis: Secondary | ICD-10-CM

## 2020-04-05 DIAGNOSIS — D849 Immunodeficiency, unspecified: Secondary | ICD-10-CM | POA: Diagnosis present

## 2020-04-05 DIAGNOSIS — E039 Hypothyroidism, unspecified: Secondary | ICD-10-CM | POA: Diagnosis present

## 2020-04-05 DIAGNOSIS — F32A Depression, unspecified: Secondary | ICD-10-CM | POA: Diagnosis present

## 2020-04-05 DIAGNOSIS — Z85820 Personal history of malignant melanoma of skin: Secondary | ICD-10-CM | POA: Diagnosis not present

## 2020-04-05 DIAGNOSIS — J441 Chronic obstructive pulmonary disease with (acute) exacerbation: Secondary | ICD-10-CM

## 2020-04-05 DIAGNOSIS — R0609 Other forms of dyspnea: Secondary | ICD-10-CM

## 2020-04-05 DIAGNOSIS — I110 Hypertensive heart disease with heart failure: Secondary | ICD-10-CM | POA: Diagnosis present

## 2020-04-05 DIAGNOSIS — Z4659 Encounter for fitting and adjustment of other gastrointestinal appliance and device: Secondary | ICD-10-CM

## 2020-04-05 DIAGNOSIS — D6959 Other secondary thrombocytopenia: Secondary | ICD-10-CM | POA: Diagnosis present

## 2020-04-05 DIAGNOSIS — Z8673 Personal history of transient ischemic attack (TIA), and cerebral infarction without residual deficits: Secondary | ICD-10-CM | POA: Diagnosis not present

## 2020-04-05 DIAGNOSIS — Z20822 Contact with and (suspected) exposure to covid-19: Secondary | ICD-10-CM | POA: Diagnosis present

## 2020-04-05 DIAGNOSIS — E785 Hyperlipidemia, unspecified: Secondary | ICD-10-CM | POA: Diagnosis present

## 2020-04-05 DIAGNOSIS — J9611 Chronic respiratory failure with hypoxia: Secondary | ICD-10-CM | POA: Diagnosis present

## 2020-04-05 DIAGNOSIS — Z885 Allergy status to narcotic agent status: Secondary | ICD-10-CM | POA: Diagnosis not present

## 2020-04-05 DIAGNOSIS — D72823 Leukemoid reaction: Secondary | ICD-10-CM | POA: Diagnosis not present

## 2020-04-05 DIAGNOSIS — I5031 Acute diastolic (congestive) heart failure: Secondary | ICD-10-CM | POA: Diagnosis present

## 2020-04-05 DIAGNOSIS — I480 Paroxysmal atrial fibrillation: Secondary | ICD-10-CM | POA: Diagnosis present

## 2020-04-05 DIAGNOSIS — J80 Acute respiratory distress syndrome: Secondary | ICD-10-CM

## 2020-04-05 LAB — CBC
HCT: 27.7 % — ABNORMAL LOW (ref 39.0–52.0)
Hemoglobin: 8.6 g/dL — ABNORMAL LOW (ref 13.0–17.0)
MCH: 31.9 pg (ref 26.0–34.0)
MCHC: 31 g/dL (ref 30.0–36.0)
MCV: 102.6 fL — ABNORMAL HIGH (ref 80.0–100.0)
Platelets: 32 10*3/uL — ABNORMAL LOW (ref 150–400)
RBC: 2.7 MIL/uL — ABNORMAL LOW (ref 4.22–5.81)
RDW: 20.6 % — ABNORMAL HIGH (ref 11.5–15.5)
WBC: 100.7 10*3/uL (ref 4.0–10.5)
nRBC: 49.6 % — ABNORMAL HIGH (ref 0.0–0.2)

## 2020-04-05 LAB — RESP PANEL BY RT-PCR (FLU A&B, COVID) ARPGX2
Influenza A by PCR: NEGATIVE
Influenza B by PCR: NEGATIVE
SARS Coronavirus 2 by RT PCR: NEGATIVE

## 2020-04-05 MED ORDER — ORAL CARE MOUTH RINSE
15.0000 mL | OROMUCOSAL | Status: DC
  Administered 2020-04-06 – 2020-04-07 (×11): 15 mL via OROMUCOSAL

## 2020-04-05 MED ORDER — FENTANYL 2500MCG IN NS 250ML (10MCG/ML) PREMIX INFUSION
25.0000 ug/h | INTRAVENOUS | Status: DC
  Administered 2020-04-06: 01:00:00 25 ug/h via INTRAVENOUS
  Administered 2020-04-07: 07:00:00 75 ug/h via INTRAVENOUS
  Filled 2020-04-05 (×2): qty 250

## 2020-04-05 MED ORDER — LORAZEPAM 2 MG/ML IJ SOLN: 1.0000 mg | Freq: Once | INTRAMUSCULAR | Status: AC

## 2020-04-05 MED ORDER — PROPOFOL 1000 MG/100ML IV EMUL
5.0000 ug/kg/min | INTRAVENOUS | Status: DC
  Administered 2020-04-06: 10:00:00 60 ug/kg/min via INTRAVENOUS
  Administered 2020-04-06 (×2): 80 ug/kg/min via INTRAVENOUS
  Administered 2020-04-06 (×2): 60 ug/kg/min via INTRAVENOUS
  Administered 2020-04-07: 09:00:00 40 ug/kg/min via INTRAVENOUS
  Administered 2020-04-07: 04:00:00 60 ug/kg/min via INTRAVENOUS
  Filled 2020-04-05 (×8): qty 100

## 2020-04-05 MED ORDER — ETOMIDATE 2 MG/ML IV SOLN
INTRAVENOUS | Status: AC
  Administered 2020-04-05: 22:00:00 20 mg via INTRAVENOUS
  Filled 2020-04-05: qty 10

## 2020-04-05 MED ORDER — PANTOPRAZOLE SODIUM 40 MG IV SOLR
40.0000 mg | Freq: Every day | INTRAVENOUS | Status: DC
  Administered 2020-04-06 – 2020-04-07 (×2): 40 mg via INTRAVENOUS
  Filled 2020-04-05 (×2): qty 40

## 2020-04-05 MED ORDER — FENTANYL CITRATE (PF) 100 MCG/2ML IJ SOLN
25.0000 ug | Freq: Once | INTRAMUSCULAR | Status: AC
Start: 2020-04-06 — End: 2020-04-06
  Administered 2020-04-06: 25 ug via INTRAVENOUS
  Filled 2020-04-05: qty 2

## 2020-04-05 MED ORDER — DOCUSATE SODIUM 100 MG PO CAPS: 100.0000 mg | ORAL_CAPSULE | Freq: Two times a day (BID) | ORAL | Status: DC | PRN

## 2020-04-05 MED ORDER — ALBUTEROL (5 MG/ML) CONTINUOUS INHALATION SOLN
10.0000 mg/h | INHALATION_SOLUTION | RESPIRATORY_TRACT | Status: DC
  Filled 2020-04-05: qty 20

## 2020-04-05 MED ORDER — IPRATROPIUM-ALBUTEROL 0.5-2.5 (3) MG/3ML IN SOLN
3.0000 mL | RESPIRATORY_TRACT | Status: DC
  Administered 2020-04-06 – 2020-04-08 (×14): 3 mL via RESPIRATORY_TRACT
  Filled 2020-04-05 (×17): qty 3

## 2020-04-05 MED ORDER — DEXMEDETOMIDINE HCL IN NACL 200 MCG/50ML IV SOLN
0.0000 ug/kg/h | INTRAVENOUS | Status: DC
  Filled 2020-04-05: qty 50

## 2020-04-05 MED ORDER — CHLORHEXIDINE GLUCONATE 0.12% ORAL RINSE (MEDLINE KIT)
15.0000 mL | Freq: Two times a day (BID) | OROMUCOSAL | Status: DC
  Administered 2020-04-06 – 2020-04-10 (×7): 15 mL via OROMUCOSAL

## 2020-04-05 MED ORDER — SUCCINYLCHOLINE CHLORIDE 20 MG/ML IJ SOLN: 100.0000 mg | Freq: Once | INTRAMUSCULAR | Status: AC

## 2020-04-05 MED ORDER — METHYLPREDNISOLONE SODIUM SUCC 125 MG IJ SOLR
60.0000 mg | INTRAMUSCULAR | Status: DC
  Administered 2020-04-06 – 2020-04-07 (×3): 60 mg via INTRAVENOUS
  Filled 2020-04-05 (×3): qty 2

## 2020-04-05 MED ORDER — LORAZEPAM 2 MG/ML IJ SOLN
INTRAMUSCULAR | Status: AC
  Administered 2020-04-05: 1 mg via INTRAVENOUS
  Filled 2020-04-05: qty 1

## 2020-04-05 MED ORDER — FENTANYL BOLUS VIA INFUSION
25.0000 ug | INTRAVENOUS | Status: DC | PRN
  Administered 2020-04-06 – 2020-04-07 (×2): 25 ug via INTRAVENOUS
  Filled 2020-04-05: qty 25

## 2020-04-05 MED ORDER — SODIUM CHLORIDE 0.9 % IV SOLN: 1.0000 g | INTRAVENOUS | Status: DC

## 2020-04-05 MED ORDER — SUCCINYLCHOLINE CHLORIDE 200 MG/10ML IV SOSY
PREFILLED_SYRINGE | INTRAVENOUS | Status: AC
  Administered 2020-04-05: 100 mg via INTRAVENOUS
  Filled 2020-04-05: qty 10

## 2020-04-05 MED ORDER — PROPOFOL 1000 MG/100ML IV EMUL
INTRAVENOUS | Status: AC
  Administered 2020-04-05: 23:00:00 30 ug/kg/min via INTRAVENOUS
  Filled 2020-04-05: qty 100

## 2020-04-05 MED ORDER — POLYETHYLENE GLYCOL 3350 17 G PO PACK
17.0000 g | PACK | Freq: Every day | ORAL | Status: DC
  Administered 2020-04-07 – 2020-04-10 (×2): 17 g
  Filled 2020-04-05 (×3): qty 1

## 2020-04-05 MED ORDER — ALBUTEROL (5 MG/ML) CONTINUOUS INHALATION SOLN
INHALATION_SOLUTION | RESPIRATORY_TRACT | Status: AC
  Filled 2020-04-05: qty 20

## 2020-04-05 MED ORDER — DEXTROSE 5 % IV SOLN
250.0000 mg | INTRAVENOUS | Status: DC
  Administered 2020-04-06 – 2020-04-07 (×3): 250 mg via INTRAVENOUS
  Filled 2020-04-05 (×3): qty 250

## 2020-04-05 MED ORDER — DOCUSATE SODIUM 50 MG/5ML PO LIQD
100.0000 mg | Freq: Two times a day (BID) | ORAL | Status: DC
  Administered 2020-04-06 – 2020-04-07 (×2): 100 mg
  Filled 2020-04-05 (×2): qty 10

## 2020-04-05 MED ORDER — ETOMIDATE 2 MG/ML IV SOLN: 20.0000 mg | Freq: Once | INTRAVENOUS | Status: AC

## 2020-04-05 MED ORDER — HEPARIN SODIUM (PORCINE) 5000 UNIT/ML IJ SOLN
5000.0000 [IU] | Freq: Three times a day (TID) | INTRAMUSCULAR | Status: DC
  Administered 2020-04-06 (×2): 5000 [IU] via SUBCUTANEOUS
  Filled 2020-04-05 (×2): qty 1

## 2020-04-05 MED ORDER — POLYETHYLENE GLYCOL 3350 17 G PO PACK: 17.0000 g | PACK | Freq: Every day | ORAL | Status: DC | PRN

## 2020-04-05 NOTE — ED Provider Notes (Signed)
[facility] DEPT Provider Note   CSN: [id number] Arrival date & time: 04/05/20  2128     History Chief Complaint  Patient presents with  . Shortness of Breath    Carlos Vaughn is a 74 y.o. adult.  HPI He presents for evaluation of shortness of breath onset today, transferred by EMS, during which she was treated with nebulizers, Solu-Medrol, and magnesium sulfate.  Patient reports he has been using inhalers at home without relief.  He denies fevers, chills, cough, focal weakness or paresthesia.  Level 5 caveat-severe respiratory distress    Past Medical History:  Diagnosis Date  . Anxiety   . COPD (chronic obstructive pulmonary disease) ([place])   . Depression   . DVT (deep venous thrombosis) ([place])   . Hyperlipemia   . Melanoma ([place])   . Myelofibrosis ([place])   . PTSD (post-traumatic stress disorder)     Patient Active Problem List   Diagnosis Date Noted  . Acute respiratory failure with hypoxia ([place]) 04/05/2020  . Cigarette smoker 01/16/2020  . Bilateral leg edema 03/17/2019  . Bilateral lower extremity edema 03/16/2019  . COPD GOLD 2/ Group B   03/16/2019  . DOE (dyspnea on exertion) 03/16/2019  . Myelofibrosis ([place])   . Symptomatic anemia 03/15/2019  . COPD with acute exacerbation ([place]) 03/15/2019  . Partial symptomatic epilepsy with complex partial seizures, not intractable, without status epilepticus ([place]) 07/21/2017  . Acute deep vein thrombosis (DVT) of distal vein of lower extremity (HCC)   . Acute deep vein thrombosis (DVT) of proximal vein of lower extremity (HCC)   . Subdural hematoma ([place])   . Chronic obstructive pulmonary disease ([place])   . PTSD (post-traumatic stress disorder)   . Seizures ([place])   . Leukocytosis   . Thrombocytopenia ([place])   . Anemia of chronic disease   . Subdural hemorrhage ([place]) 05/07/2017  . Anticoagulated for Rt Leg DVT 05/07/2017  . History of DVT of lower extremity 02/2017 05/07/2017    Past  Surgical History:  Procedure Laterality Date  . HERNIA REPAIR    . IR IVC FILTER PLMT / S&I /IMG GUID/MOD SED  05/08/2017  . IR RADIOLOGIST EVAL & MGMT  07/19/2017  . MELANOMA EXCISION     removed off back       Family History  Problem Relation Age of Onset  . Pneumonia Mother   . Breast cancer Mother   . Leukemia Father   . Cancer Sister        unsure of type - thinks it was breast cancer    Social History   Tobacco Use  . Smoking status: Current Every Day Smoker    Packs/day: 0.50    Years: 40.00    Pack years: 20.00    Types: Cigarettes    Last attempt to quit: 06/07/2019    Years since quitting: 0.8  . Smokeless tobacco: Never Used  Vaping Use  . Vaping Use: Never used  Substance Use Topics  . Alcohol use: Yes    Comment: 2 beers twice weekly  . Drug use: No    Home Medications Prior to Admission medications   Medication Sig Start Date End Date Taking? Authorizing Provider  albuterol (PROVENTIL) (2.5 MG/3ML) 0.083% nebulizer solution Take 3 mLs (2.5 mg total) by nebulization every 6 (six) hours as needed for wheezing or shortness of breath. 08/30/19   Tanda Rockers, MD  albuterol (VENTOLIN HFA) 108 (90 Base) MCG/ACT inhaler Inhale 1-2 puffs into the lungs  every 6 (six) hours as needed for wheezing or shortness of breath. 06/18/19   Tacy Learn, PA-C  CALCIUM-MAGNESIUM-ZINC PO Take 3 tablets by mouth daily.    [provider]  furosemide (LASIX) 40 MG tablet Take 40 mg by mouth as directed. Takes prn for "swelling"    [provider]  levothyroxine (SYNTHROID) 75 MCG tablet Take 75 mcg by mouth daily before breakfast.    [provider]  Multiple Vitamin (MULTIVITAMIN) tablet Take 1 tablet by mouth daily.    [provider]  Potassium 99 MG TABS Take 1 tablet by mouth daily.    [provider]  prazosin (MINIPRESS) 2 MG capsule Take 2 mg by mouth at bedtime.     [provider]  ruxolitinib phosphate (JAKAFI) 10  MG tablet Take 10-15 mg by mouth 2 (two) times daily. Take 5m in am and 565min pm.    [provider]  Tiotropium Bromide-Olodaterol (STIOLTO RESPIMAT) 2.5-2.5 MCG/ACT AERS Inhale 2 puffs into the lungs daily.    [provider]  traZODone (DESYREL) 150 MG tablet Take 150 mg by mouth at bedtime.    [provider]    Allergies    Oxycodone  Review of Systems   Review of Systems  Unable to perform ROS: Acuity of condition    Physical Exam Updated Vital Signs BP (!) 126/107   Pulse 97   Temp 98.1 F (36.7 C) (Oral)   Resp (!) 24   Ht '5\' 8"'  (1.727 m)   Wt 64 kg   SpO2 100%   BMI 21.44 kg/m   Physical Exam Vitals and nursing note reviewed.  Constitutional:      General: He is in acute distress.     Appearance: He is well-developed and well-nourished. He is ill-appearing. He is not toxic-appearing or diaphoretic.  HENT:     Head: Normocephalic and atraumatic.     Right Ear: External ear normal.     Left Ear: External ear normal.  Eyes:     Extraocular Movements: EOM normal.     Conjunctiva/sclera: Conjunctivae normal.     Pupils: Pupils are equal, round, and reactive to light.  Neck:     Trachea: Phonation normal.  Cardiovascular:     Rate and Rhythm: Normal rate and regular rhythm.     Heart sounds: Normal heart sounds.  Pulmonary:     Effort: Respiratory distress present.     Breath sounds: No stridor. Wheezing and rhonchi present.  Chest:     Chest wall: No bony tenderness.  Abdominal:     General: There is no distension.     Palpations: Abdomen is soft.     Tenderness: There is no abdominal tenderness.  Musculoskeletal:        General: Normal range of motion.     Cervical back: Normal range of motion and neck supple.     Right lower leg: No edema.     Left lower leg: No edema.  Skin:    General: Skin is warm and intact.     Comments: Diaphoretic  Neurological:     Mental Status: He is alert and oriented to person, place, and  time.     Cranial Nerves: No cranial nerve deficit.     Sensory: No sensory deficit.     Motor: No abnormal muscle tone.     Coordination: Coordination normal.  Psychiatric:        Mood and Affect: Mood and affect and  mood normal.        Behavior: Behavior normal.     ED Results / Procedures / Treatments   Labs (all labs ordered are listed, but only abnormal results are displayed) Labs Reviewed  CBC - Abnormal; Notable for the following components:      Result Value   WBC 100.7 (*)    RBC 2.70 (*)    Hemoglobin 8.6 (*)    HCT 27.7 (*)    MCV 102.6 (*)    RDW 20.6 (*)    Platelets 32 (*)    nRBC 49.6 (*)    All other components within normal limits  BRAIN NATRIURETIC PEPTIDE - Abnormal; Notable for the following components:   B Natriuretic Peptide 199.0 (*)    All other components within normal limits  RESP PANEL BY RT-PCR (FLU A&B, COVID) ARPGX2  CULTURE, BLOOD (ROUTINE X 2)  CULTURE, BLOOD (ROUTINE X 2)  CULTURE, RESPIRATORY  BASIC METABOLIC PANEL  BLOOD GAS, ARTERIAL  PROCALCITONIN  PROCALCITONIN  CBC  CBC  BASIC METABOLIC PANEL  MAGNESIUM  PHOSPHORUS    EKG None  Radiology DG Chest Port 1 View  Result Date: 04/05/2020 CLINICAL DATA:  Status post intubation. EXAM: PORTABLE CHEST 1 VIEW COMPARISON:  April 05, 2020 (9:53 p.m.) FINDINGS: An endotracheal tube is seen with its distal tip approximately 6.2 cm from the carina. A nasogastric tube is noted with its distal end extending below the level of the diaphragm. Stable diffusely increased interstitial lung markings are seen. Mild scarring is again noted within the right lung base. There is no evidence of a pleural effusion or pneumothorax. The heart size and mediastinal contours are within normal limits. The visualized skeletal structures are unremarkable. IMPRESSION: 1. Interval endotracheal tube and nasogastric tube placement and positioning, as described above. Electronically Signed   By: Virgina Norfolk  M.D.   On: 04/05/2020 23:03   DG Chest Port 1 View  Result Date: 04/05/2020 CLINICAL DATA:  COPD, history of melanoma, tobacco abuse EXAM: PORTABLE CHEST 1 VIEW COMPARISON:  08/08/2019 FINDINGS: 2 frontal views of the chest demonstrate a stable cardiac silhouette. Background emphysema again noted, with right basilar scarring. Increased interstitial prominence with mild central vascular congestion and small right pleural effusion. No pneumothorax. No acute bony abnormalities. IMPRESSION: 1. Mild interstitial edema superimposed upon background emphysema. Electronically Signed   By: Randa Ngo M.D.   On: 04/05/2020 22:09    Procedures .Critical Care Performed by: Daleen Bo, MD Authorized by: Daleen Bo, MD   Critical care provider statement:    Critical care time (minutes):  35   Critical care start time:  04/05/2020 9:58 PM   Critical care end time:  04/06/2020 12:47 AM   Critical care time was exclusive of:  Separately billable procedures and treating other patients   Critical care was necessary to treat or prevent imminent or life-threatening deterioration of the following conditions:  Respiratory failure   Critical care was time spent personally by me on the following activities:  Blood draw for specimens, development of treatment plan with patient or surrogate, discussions with consultants, evaluation of patient's response to treatment, examination of patient, obtaining history from patient or surrogate, ordering and performing treatments and interventions, ordering and review of laboratory studies, pulse oximetry, re-evaluation of patient's condition, review of old charts and ordering and review of radiographic studies   (including critical care time)  Medications Ordered in ED Medications  albuterol (PROVENTIL,VENTOLIN) solution continuous neb ( Nebulization Given 04/05/20 2149)  propofol (DIPRIVAN) 1000 MG/100ML infusion (30 mcg/kg/min  64 kg Intravenous New Bag/Given  04/05/20 2244)  docusate (COLACE) 50 MG/5ML liquid 100 mg (has no administration in time range)  polyethylene glycol (MIRALAX / GLYCOLAX) packet 17 g (has no administration in time range)  fentaNYL 2547mg in NS 2585m(1027mml) infusion-PREMIX (25 mcg/hr Intravenous New Bag/Given 04/06/20 0036)  fentaNYL (SUBLIMAZE) bolus via infusion 25 mcg (has no administration in time range)  dexmedetomidine (PRECEDEX) 200 MCG/50ML (4 mcg/mL) infusion (has no administration in time range)  pantoprazole (PROTONIX) injection 40 mg (has no administration in time range)  ipratropium-albuterol (DUONEB) 0.5-2.5 (3) MG/3ML nebulizer solution 3 mL (3 mLs Nebulization Given 04/06/20 0030)  chlorhexidine gluconate (MEDLINE KIT) (PERIDEX) 0.12 % solution 15 mL (has no administration in time range)  MEDLINE mouth rinse (has no administration in time range)  azithromycin (ZITHROMAX) 250 mg in dextrose 5 % 125 mL IVPB (has no administration in time range)  docusate sodium (COLACE) capsule 100 mg (has no administration in time range)  polyethylene glycol (MIRALAX / GLYCOLAX) packet 17 g (has no administration in time range)  heparin injection 5,000 Units (has no administration in time range)  methylPREDNISolone sodium succinate (SOLU-MEDROL) 125 mg/2 mL injection 60 mg (has no administration in time range)  furosemide (LASIX) injection 40 mg (has no administration in time range)  LORazepam (ATIVAN) injection 1 mg (1 mg Intravenous Given 04/05/20 2150)  succinylcholine (ANECTINE) injection 100 mg (100 mg Intravenous Given 04/05/20 2222)  etomidate (AMIDATE) injection 20 mg (20 mg Intravenous Given 04/05/20 2220)  fentaNYL (SUBLIMAZE) injection 25 mcg (25 mcg Intravenous Given 04/06/20 0016)    ED Course  I have reviewed the triage vital signs and the nursing notes.  Pertinent labs & imaging results that were available during my care of the patient were reviewed by me and considered in my medical decision making (see  chart for details).  Clinical Course as of 04/06/20 0052  Sat Apr 05, 2020  2158 Patient indicates that he would accept intubation if needed. [EW]  2239 DG Chest Port 1 View [EW]    Clinical Course User Index [EW] WenDaleen BoD   MDM Rules/Calculators/A&P                           Patient Vitals for the past 24 hrs:  BP Temp Temp src Pulse Resp SpO2 Height Weight  04/06/20 0030 -- -- -- -- -- 100 % -- --  04/05/20 2330 (!) 126/107 -- -- 97 (!) 24 99 % -- --  04/05/20 2315 -- -- -- (!) 101 (!) 25 99 % -- --  04/05/20 2300 122/76 -- -- (!) 102 (!) 25 99 % -- --  04/05/20 2245 140/71 -- -- (!) 104 (!) 21 100 % -- --  04/05/20 2230 (!) 152/70 -- -- 100 16 99 % -- --  04/05/20 2200 (!) 189/92 -- -- 100 (!) 25 91 % -- --  04/05/20 2151 -- 98.1 F (36.7 C) Oral -- -- -- '5\' 8"'  (1.727 m) 64 kg  04/05/20 2145 -- -- -- 98 (!) 27 (!) 83 % -- --  04/05/20 2144 (!) 162/94 98.2 F (36.8 C) Oral (!) 105 (!) 22 93 % -- --  04/05/20 2142 -- -- -- -- -- 93 % -- --  04/05/20 2140 -- -- -- -- (!) 25 -- -- --  04/05/20 2139 -- -- -- (!) 104 -- 99 % -- --  04/05/20 2137 (!)[name]  162/94 -- -- -- -- -- -- --    Medical Decision Making:  This patient is presenting for evaluation of shortness of breath, which does require a range of treatment options, and is a complaint that involves a high risk of morbidity and mortality. The differential diagnoses include pneumonia, COPD exacerbation, Covid infection. I decided to review old records, and in summary patient with COPD, uses nebulizer and inhaler at home presenting with gradually worse trouble breathing for 2 days..  I obtained additional historical information from wife who came to the emergency department.  Clinical Laboratory Tests Ordered, included CBC, Metabolic panel and Covid test, blood gas. Review indicates markedly elevated white count, hemoglobin low. Radiologic Tests Ordered, included chest x-ray.  I independently Visualized: Radiograph  images, which show emphysema with fluid in fissures.  Repeat x-ray post intubation with ET tube above carina    Critical Interventions-clinical evaluation, laboratory testing, chest x-ray, observation, intubation, critical care consultation  After These Interventions, the Patient was reevaluated and was found with severe COPD exacerbation, respiratory arrest in the ED requiring emergent intubation.  White count elevated consistent with prior diagnosis of myelo fibrosis.  CRITICAL CARE-yes Performed by: Daleen Bo  Nursing Notes Reviewed/ Care Coordinated Applicable Imaging Reviewed Interpretation of Laboratory Data incorporated into ED treatment  Plan: Admit to critical care    Final Clinical Impression(s) / ED Diagnoses Final diagnoses:  Acute respiratory failure with hypoxia ([place])  COPD exacerbation ([place])    Rx / DC Orders ED Discharge Orders    None       Daleen Bo, MD 04/06/20 [phone]

## 2020-04-05 NOTE — ED Notes (Signed)
Pt, VSS , pt tolerating the vent with sedation

## 2020-04-05 NOTE — ED Notes (Signed)
Pt via ems in resp distress . Ems states pt was just D?C from 

## 2020-04-05 NOTE — ED Notes (Signed)
18 fr , right nare NG tube placed to intermittent suction. Verified with ascultation, and x ray ,

## 2020-04-05 NOTE — ED Triage Notes (Signed)
Pt BIB [facility] from home. Reports that he has a history of cancer and breathing problems. Recently d/c'd from [place] that he has been doing neb treatments all day without relief. Intercostal retractions noted. Unable to tolerate Cpap with EMS. 125mg  of solumedrol and 2g of mag given en route.

## 2020-04-05 NOTE — H&P (Signed)
NAME:  Carlos Vaughn, MRN:  [id number], DOB:  February 17, 1946, LOS: 0 ADMISSION DATE:  04/05/2020, CONSULTATION DATE:  04/05/2020 REFERRING MD:  Dr. Sonny Dandy , CHIEF COMPLAINT:  Respiratory distress   Brief History:  74yo male presented with complaints of SOB who developed respiratory arrest requiring intubation. PCCM consult for admission.  History of Present Illness:  Patient is intubated and sedated therefore history obtained from chart review and verbal report  Carlos Vaughn is a 74 year old male with a past medical history significant for COPD, myelofibrosis prior DVT, hemorrhagic CVA 2018, depression, anxiety, PTSD who presented to the emergency department with complaints of shortness of breath.    Further chart review reveals patient was recently treated at [facility] for anemia and thrombocytopenia secondary to myelofibrosis he received multiple blood transfusions and platelet transfusions which resulted in acute dyspnea secondary to volume overload, he was also treated for COPD exasperation.  Day of discharge from [facility] 04/04/2020 WBC 38, hemoglobin 7.9, platelets 5.  It appears patient was encouraged to follow-up with primary oncologist at discharge  While in the emergency department at this facility patient developed worsened respiratory distress resulting in respiratory arrest requiring emergent endotracheal intubation.  Vital signs included tachypnea and hypertension.  Bmet, CBC, BNP, and ABG all pending at time of assessment.  Hest x-ray with mild interstitial edema.  Updated wife via phone. He was discharged [facility] on [facility]. He had follow up with his oncologist Friday 12/17 in the office, where he received 2 units pRBCs and platelet transfusions. He had been feeling more SOB yesterday but was responding to bronchodilators. Today his dyspnea worsened and he asked to be brought to the hospital.   Past Medical History:  COPD- FEV1 52%  predicted Myelofibrosis Hyperlipidemia DVT Anxiety Hemorrhagic CVA  Significant Hospital Events:  Admitted for shortness of breath, suffered a respiratory arrest in ED 12/18> intubated.  Consults:  Oncology  Procedures:  ET tube 12/18 >  Significant Diagnostic Tests:   Chest x-ray 12/18 > mild interstitial edema  Micro Data:  COVID 12/18 > negative Flu A&B negative Blood cultures 12/19> Trach aspirate 12/19>  Antimicrobials:  vanc 12/19> Cefepime 12/19> Azithromycin 12/19>  Interim History / Subjective:    Objective   Blood pressure 122/76, pulse (!) 101, temperature 98.1 F (36.7 C), temperature source Oral, resp. rate (!) 25, height '5\' 8"'  (1.727 m), weight 64 kg, SpO2 99 %.       No intake or output data in the 24 hours ending 04/05/20 2319 Filed Weights   04/05/20 2151  Weight: 64 kg    Examination: General: Ill appearing man laying in bed in NAD HENT: [place]/AT, eyes anicteric Lungs: Breathing comfortably on MV, strong cough with suctioning. Thick but clear ETT secretions. Cardiovascular: RRR, no murmurs Abdomen: Soft, NT Extremities: pitting edema LLE with compression sock in place, no edema Neuro: RASS -3, able to follow simple commands  Derm: Petechiae on legs, shoulders, arms, chest. Pallor.   Resolved Hospital Problem list     Assessment & Plan:  Myelofibrosis- acute leukemoid reaction vs sepsis causing acute leukocytosis. -Per recent admission at [facility]; last bone marrow biopsy obtained 02/26/2020 consistent with myelofibrosis with no evidence of transformation to acute leukemia.  Patient is on Febratinib and Danazol.  Anemia - chronic Thrombocytopenia- chronic -CBC charge from [facility] 12/17 included WBC 38, hemoglobin 7.8, platelets 5.  CBC on this admission currently pending P: Follow-up on CBC differential. Discussed with Oncology- likely  not responsive to leukopheresis if it is mature myelocytes rather than immature  blasts. -Transfuse for hemoglobin goal greater than 7 and platelet goal greater than 10 -Oncology/hematology consulted- appreciate their assistance. Recommending transfer to [facility]. Transfer request has been placed with transfer center at [facility]. -Monitor for signs of bleeding -Continue supportive care -Empiric antibiotics, need to collect cultures first-- d/w RN.  Acute Hypoxic Respiratory Failure. Concern that a complication from myelofibrosis- acute cardiogenic pulmonary edema/TACO vs TRALI vs leukostasis is causing his respiratory failure rather than COPD. P: -Continue ventilator support with lung protective strategies  -Wean PEEP and FiO2 for sats greater than 90%. -Plateau pressures less than 30 cm H20 & DP <15 -AM CXR -SAT/SBT daily when appropriate   -VAP bundle in place  -PAD protocol for sedation. Restraints PRN if ETT threatened. -Empiric antibioics for CAP in immunocompromised host. -checking BNP> elevated> will give lasix. -procalcitonin pending  History of COPD- FEV1 52% predicted. -Home medication includes Atrovent and albuterol P: -Ventilator support as above -Continue bronchodilators & steroids -Supportive care  Hypothyroidism -Home medication includes Synthroid P: -Continue IV Synthroid  BMP pending; likely severely elevated WBC is interfering with lab assays. Will follow up.  Wife and daughter updated over the phone. They have requested for Dr. Mariea Clonts at [facility] to be updated. Transfer center unable to connect to him tonight since he requires transfer to a service other than oncology (MICU).   Best practice (evaluated daily)  Diet: N.p.o. consider initiation of tube feeds in a.m. Pain/Anxiety/Delirium protocol (if indicated): As needed's VAP protocol (if indicated): In place DVT prophylaxis: Subcu heparin GI prophylaxis: PPI Glucose control: Monitor Mobility: Bedrest Disposition: ICU  Goals of Care:  Last date of multidisciplinary goals of care discussion:  Pending Family and staff present: Pending Summary of discussion: Pending Follow up goals of care discussion due: Pending Code Status: Full  Labs   CBC: No results for input(s): WBC, NEUTROABS, HGB, HCT, MCV, PLT in the last 168 hours.  Basic Metabolic Panel: No results for input(s): NA, K, CL, CO2, GLUCOSE, BUN, CREATININE, CALCIUM, MG, PHOS in the last 168 hours. GFR: CrCl cannot be calculated (Patient's most recent lab result is older than the maximum 21 days allowed.). No results for input(s): PROCALCITON, WBC, LATICACIDVEN in the last 168 hours.  Liver Function Tests: No results for input(s): AST, ALT, ALKPHOS, BILITOT, PROT, ALBUMIN in the last 168 hours. No results for input(s): LIPASE, AMYLASE in the last 168 hours. No results for input(s): AMMONIA in the last 168 hours.  ABG    Component Value Date/Time   TCO2 23 05/07/2017 1000     Coagulation Profile: No results for input(s): INR, PROTIME in the last 168 hours.  Cardiac Enzymes: No results for input(s): CKTOTAL, CKMB, CKMBINDEX, TROPONINI in the last 168 hours.  HbA1C: No results found for: HGBA1C  CBG: No results for input(s): GLUCAP in the last 168 hours.  Review of Systems:   Unable to gather secondary to intubated/ sedated  Past Medical History:  Carlos Vaughn,  has a past medical history of Anxiety, COPD (chronic obstructive pulmonary disease) ([place]), Depression, DVT (deep venous thrombosis) ([place]), Hyperlipemia, Melanoma ([place]), Myelofibrosis ([place]), and PTSD (post-traumatic stress disorder).   Surgical History:   Past Surgical History:  Procedure Laterality Date  . HERNIA REPAIR    . IR IVC FILTER PLMT / S&I /IMG GUID/MOD SED  05/08/2017  . IR RADIOLOGIST EVAL & MGMT  07/19/2017  . MELANOMA EXCISION     removed off  back     Social History:   reports that Carlos Vaughn has been smoking cigarettes. Carlos Vaughn has a 20.00 pack-year smoking history. Carlos Vaughn has never used smokeless tobacco.  Carlos Vaughn reports current alcohol use. Carlos Vaughn reports that [employer] [place] does not use drugs.   Family History:  Carlos Vaughn's family history includes Breast cancer in Carlos Vaughn; Cancer in Carlos Vaughn's sister; Leukemia in Carlos Vaughn's father; Pneumonia in Carlos Vaughn's Vaughn.   Allergies Allergies  Allergen Reactions  . Oxycodone Itching     Home Medications  Prior to Admission medications   Medication Sig Start Date End Date Taking? Authorizing Provider  albuterol (PROVENTIL) (2.5 MG/3ML) 0.083% nebulizer solution Take 3 mLs (2.5 mg total) by nebulization every 6 (six) hours as needed for wheezing or shortness of breath. 08/30/19   Tanda Rockers, MD  albuterol (VENTOLIN HFA) 108 (90 Base) MCG/ACT inhaler Inhale 1-2 puffs into the lungs every 6 (six) hours as needed for wheezing or shortness of breath. 06/18/19   Tacy Learn, PA-C  CALCIUM-MAGNESIUM-ZINC PO Take 3 tablets by mouth daily.    [provider]  furosemide (LASIX) 40 MG tablet Take 40 mg by mouth as directed. Takes prn for "swelling"    [provider]  levothyroxine (SYNTHROID) 75 MCG tablet Take 75 mcg by mouth daily before breakfast.    [provider]  Multiple Vitamin (MULTIVITAMIN) tablet Take 1 tablet by mouth daily.    [provider]  Potassium 99 MG TABS Take 1 tablet by mouth daily.    [provider]  prazosin (MINIPRESS) 2 MG capsule Take 2 mg by mouth at bedtime.     [provider]  ruxolitinib phosphate (JAKAFI) 10 MG tablet Take 10-15 mg by mouth 2 (two) times daily. Take 4m in am and 535min pm.    [provider]  Tiotropium Bromide-Olodaterol (STIOLTO RESPIMAT) 2.5-2.5 MCG/ACT AERS Inhale 2 puffs into the lungs daily.    [provider]  traZODone (DESYREL) 150 MG tablet Take 150 mg by mouth at bedtime.    [provider]     Critical care time: 51 minutes    LaJulian HyDO  04/06/20 12:38 AM [facility]

## 2020-04-05 NOTE — ED Notes (Signed)
PT FOUND IN BED BREACHING 8 TIMES A MINUTE WITH LOW SATS. STARTED ASSISTED BREATHING WITH BVM, MD NOTIFIED, PREPARING TO INTUBATE.

## 2020-04-05 NOTE — ED Provider Notes (Signed)
  Physical Exam  BP (!) 162/94   Pulse (!) 105   Temp 98.1 F (36.7 C) (Oral)   Resp (!) 22   Ht 5\' 8"  (1.727 m)   Wt 64 kg   SpO2 93%   BMI 21.44 kg/m    ED Course/Procedures   Clinical Course as of 04/05/20 2228  Sat Apr 05, 2020  2158 Patient indicates that he would accept intubation if needed. [EW]    Clinical Course User Index [EW] Daleen Bo, MD    Procedure Name: Intubation Date/Time: 04/05/2020 10:33 PM Performed by: Kinnie Feil, PA-C Pre-anesthesia Checklist: Patient identified, Patient being monitored, Emergency Drugs available, Timeout performed and Suction available Oxygen Delivery Method: Ambu bag Preoxygenation: Pre-oxygenation with 100% oxygen Induction Type: Rapid sequence Ventilation: Mask ventilation with difficulty and Two handed mask ventilation required Laryngoscope Size: Glidescope Tube size: 7.5 mm Airway Equipment and Method: Video-laryngoscopy Placement Confirmation: ETT inserted through vocal cords under direct vision,  CO2 detector,  Breath sounds checked- equal and bilateral and Positive ETCO2 Secured at: 23 cm Tube secured with: ETT holder Dental Injury: Teeth and Oropharynx as per pre-operative assessment  Difficulty Due To: Difficulty was anticipated Future Recommendations: Recommend- induction with short-acting agent, and alternative techniques readily available        Kinnie Feil, PA-C 04/05/20 2234    Daleen Bo, MD 04/06/20 1132

## 2020-04-05 NOTE — ED Notes (Signed)
Date and time results received: 04/05/20 2347 (use smartphrase ".now" to insert current time)  Test: white count Critical Value: 100.7  Name of Provider Notified:   Orders Received? Or Actions Taken?:waiting on orders

## 2020-04-06 ENCOUNTER — Inpatient Hospital Stay (HOSPITAL_COMMUNITY): Payer: No Typology Code available for payment source

## 2020-04-06 ENCOUNTER — Encounter (HOSPITAL_COMMUNITY): Payer: Self-pay | Admitting: Critical Care Medicine

## 2020-04-06 LAB — PROCALCITONIN: Procalcitonin: 4.87 ng/mL

## 2020-04-06 LAB — BASIC METABOLIC PANEL
Anion gap: 13 (ref 5–15)
Anion gap: 11 (ref 5–15)
BUN: 32 mg/dL — ABNORMAL HIGH (ref 8–23)
BUN: 40 mg/dL — ABNORMAL HIGH (ref 8–23)
CO2: 24 mmol/L (ref 22–32)
CO2: 26 mmol/L (ref 22–32)
Calcium: 8.5 mg/dL — ABNORMAL LOW (ref 8.9–10.3)
Calcium: 8.5 mg/dL — ABNORMAL LOW (ref 8.9–10.3)
Chloride: 100 mmol/L (ref 98–111)
Chloride: 98 mmol/L (ref 98–111)
Creatinine, Ser: 0.95 mg/dL (ref 0.61–1.24)
Creatinine, Ser: 0.87 mg/dL (ref 0.61–1.24)
GFR, Estimated: >60 mL/min (ref >60)
GFR, Estimated: >60 mL/min (ref >60)
Glucose, Bld: 233 mg/dL — ABNORMAL HIGH (ref 70–99)
Glucose, Bld: 199 mg/dL — ABNORMAL HIGH (ref 70–99)
Potassium: 4.9 mmol/L (ref 3.5–5.1)
Potassium: 4.3 mmol/L (ref 3.5–5.1)
Sodium: 137 mmol/L (ref 135–145)
Sodium: 135 mmol/L (ref 135–145)

## 2020-04-06 LAB — GLUCOSE, CAPILLARY
Glucose-Capillary: 199 mg/dL — ABNORMAL HIGH (ref 70–99)
Glucose-Capillary: 142 mg/dL — ABNORMAL HIGH (ref 70–99)
Glucose-Capillary: 163 mg/dL — ABNORMAL HIGH (ref 70–99)
Glucose-Capillary: 135 mg/dL — ABNORMAL HIGH (ref 70–99)
Glucose-Capillary: 126 mg/dL — ABNORMAL HIGH (ref 70–99)
Glucose-Capillary: 124 mg/dL — ABNORMAL HIGH (ref 70–99)

## 2020-04-06 LAB — BLOOD GAS, ARTERIAL
Acid-Base Excess: 2.6 mmol/L — ABNORMAL HIGH (ref 0.0–2.0)
Bicarbonate: 27.9 mmol/L (ref 20.0–28.0)
Drawn by: 23281
FIO2: 50
MECHVT: 540 mL
O2 Saturation: 98.7 %
PEEP: 5 cmH2O
Patient temperature: 36.3
RATE: 16 resp/min
pCO2 arterial: 49.4 mmHg — ABNORMAL HIGH (ref 32.0–48.0)
pH, Arterial: 7.366 (ref 7.350–7.450)
pO2, Arterial: 112 mmHg — ABNORMAL HIGH (ref 83.0–108.0)

## 2020-04-06 LAB — CBC
HCT: 24.6 % — ABNORMAL LOW (ref 39.0–52.0)
Hemoglobin: 7.4 g/dL — ABNORMAL LOW (ref 13.0–17.0)
MCH: 30.5 pg (ref 26.0–34.0)
MCHC: 30.1 g/dL (ref 30.0–36.0)
MCV: 101.2 fL — ABNORMAL HIGH (ref 80.0–100.0)
Platelets: 13 10*3/uL — CL (ref 150–400)
RBC: 2.43 MIL/uL — ABNORMAL LOW (ref 4.22–5.81)
RDW: 20 % — ABNORMAL HIGH (ref 11.5–15.5)
WBC: 55.9 10*3/uL (ref 4.0–10.5)
nRBC: 44.1 % — ABNORMAL HIGH (ref 0.0–0.2)

## 2020-04-06 LAB — BRAIN NATRIURETIC PEPTIDE: B Natriuretic Peptide: 199 pg/mL — ABNORMAL HIGH (ref 0.0–100.0)

## 2020-04-06 LAB — MAGNESIUM: Magnesium: 2.6 mg/dL — ABNORMAL HIGH (ref 1.7–2.4)

## 2020-04-06 LAB — PHOSPHORUS: Phosphorus: 5.1 mg/dL — ABNORMAL HIGH (ref 2.5–4.6)

## 2020-04-06 LAB — MRSA PCR SCREENING: MRSA by PCR: NEGATIVE

## 2020-04-06 MED ORDER — CHLORHEXIDINE GLUCONATE CLOTH 2 % EX PADS
6.0000 | MEDICATED_PAD | Freq: Every day | CUTANEOUS | Status: DC
  Administered 2020-04-06 – 2020-04-10 (×5): 6 via TOPICAL

## 2020-04-06 MED ORDER — INSULIN ASPART 100 UNIT/ML ~~LOC~~ SOLN
1.0000 [IU] | SUBCUTANEOUS | Status: DC
  Administered 2020-04-06 (×2): 1 [IU] via SUBCUTANEOUS
  Administered 2020-04-06: 10:00:00 2 [IU] via SUBCUTANEOUS
  Administered 2020-04-06: 22:00:00 1 [IU] via SUBCUTANEOUS
  Administered 2020-04-06: 04:00:00 2 [IU] via SUBCUTANEOUS
  Filled 2020-04-06: qty 0.03

## 2020-04-06 MED ORDER — VANCOMYCIN HCL IN DEXTROSE 1-5 GM/200ML-% IV SOLN
1000.0000 mg | Freq: Once | INTRAVENOUS | Status: DC
  Filled 2020-04-06: qty 200

## 2020-04-06 MED ORDER — SODIUM CHLORIDE 0.9 % IV SOLN
2.0000 g | Freq: Once | INTRAVENOUS | Status: DC
  Filled 2020-04-06: qty 2

## 2020-04-06 MED ORDER — FUROSEMIDE 10 MG/ML IJ SOLN
40.0000 mg | Freq: Once | INTRAMUSCULAR | Status: AC
  Administered 2020-04-06: 04:00:00 40 mg via INTRAVENOUS
  Filled 2020-04-06: qty 4

## 2020-04-06 MED ORDER — SODIUM CHLORIDE 0.9 % IV SOLN
2.0000 g | Freq: Two times a day (BID) | INTRAVENOUS | Status: DC
  Administered 2020-04-06 – 2020-04-07 (×3): 2 g via INTRAVENOUS
  Filled 2020-04-06 (×3): qty 2

## 2020-04-06 MED ORDER — VANCOMYCIN HCL IN DEXTROSE 1-5 GM/200ML-% IV SOLN
1000.0000 mg | Freq: Once | INTRAVENOUS | Status: AC
  Administered 2020-04-06: 04:00:00 1000 mg via INTRAVENOUS

## 2020-04-06 MED ORDER — VANCOMYCIN HCL 500 MG/100ML IV SOLN
500.0000 mg | Freq: Two times a day (BID) | INTRAVENOUS | Status: DC
  Filled 2020-04-06: qty 100

## 2020-04-06 NOTE — Progress Notes (Signed)
Pharmacy Antibiotic Note  Carlos Vaughn is a 74 y.o. adult admitted on 04/05/2020 with complaints of SOB who developed respiratory arrest requiring intubation.  Pharmacy has been consulted for vancomycin and cefepime dosing for sepsis  Plan: Vancomycin 1gm x 1 then 500mg  q12h Cefepime 2gm IV q12h Follow renal function, cultures and clinical course  Height: 5\' 8"  (172.7 cm) Weight: 64 kg (141 lb) IBW/kg (Calculated) : 68.4  Temp (24hrs), Avg:98.2 F (36.8 C), Min:98.1 F (36.7 C), Max:98.2 F (36.8 C)  Recent Labs  Lab 04/05/20 2240  WBC 100.7*    CrCl cannot be calculated (Patient's most recent lab result is older than the maximum 21 days allowed.).    Allergies  Allergen Reactions  . Oxycodone Itching     Thank you for allowing pharmacy to be a part of this patient's care.  Yossi Hinchman RPh 04/06/2020, 12:54 AM

## 2020-04-06 NOTE — Progress Notes (Signed)
NAME:  Carlos Vaughn, MRN:  [id number], DOB:  [birth year], LOS: 1 ADMISSION DATE:  04/05/2020, CONSULTATION DATE:  04/05/2020 REFERRING MD:  Dr. Sonny Dandy , CHIEF COMPLAINT:  Respiratory distress   Brief History:  74 yo male with GOLD II copd/ Group B last smoked about 4 week PTA presented with complaints of SOB who developed respiratory arrest requiring intubation. PCCM consult for admission.  History of Present Illness:  Patient is intubated and sedated therefore history obtained from chart review and verbal report  Carlos Vaughn is a 74 year old male with a past medical history significant for COPD, myelofibrosis prior DVT, hemorrhagic CVA 2018, depression, anxiety, PTSD who presented to the emergency department with complaints of shortness of breath.    Further chart review reveals patient was recently treated at [facility] for anemia and thrombocytopenia secondary to myelofibrosis he received multiple blood transfusions and platelet transfusions which resulted in acute dyspnea secondary to volume overload, he was also treated for COPD exacerbation..  Day of discharge from [facility] 04/04/2020 WBC 38, hemoglobin 7.9, platelets 5.  It appears patient was encouraged to follow-up with primary oncologist at discharge  While in the emergency department at this facility patient developed worsened respiratory distress resulting in respiratory arrest requiring emergent endotracheal intubation.  Vital signs included tachypnea and hypertension.  Bmet, CBC, BNP, and ABG all pending at time of assessment.  Hest x-ray with mild interstitial edema.  Updated wife via phone. He was discharged from [facility] on [facility]. He had follow up with his oncologist Friday 12/17 in the office, where he received 2 units pRBCs and platelet transfusions. He had been feeling more SOB 12/17 but was responding to bronchodilators.12/18  his dyspnea worsened and he asked to be brought to the hospital.   Past Medical  History:  COPD- FEV1 52% predicted Myelofibrosis Hyperlipidemia DVT Anxiety Hemorrhagic CVA  Significant Hospital Events:  Admitted for shortness of breath, suffered a respiratory arrest in ED 12/18> intubated.  Consults:  Oncology  Procedures:  ET tube 12/18 >  Significant Diagnostic Tests:   Chest x-ray 12/18 > mild interstitial edema  Micro Data:  COVID 12/18 >   negative Flu A&B 12/18  > negative Blood cultures 12/18> MRSA  12/19 neg  Trach aspirate 12/19>  Antimicrobials:  vanc 12/19> Cefepime 12/19> Azithromycin 12/19>   Scheduled Meds: . chlorhexidine gluconate (MEDLINE KIT)  15 mL Mouth Rinse BID  . Chlorhexidine Gluconate Cloth  6 each Topical Daily  . docusate  100 mg Per Tube BID  . heparin  5,000 Units Subcutaneous Q8H  . insulin aspart  1-3 Units Subcutaneous Q4H  . ipratropium-albuterol  3 mL Nebulization Q4H  . mouth rinse  15 mL Mouth Rinse 10 times per day  . methylPREDNISolone (SOLU-MEDROL) injection  60 mg Intravenous Q24H  . pantoprazole (PROTONIX) IV  40 mg Intravenous Daily  . polyethylene glycol  17 g Per Tube Daily   Continuous Infusions: . albuterol    . azithromycin Stopped (04/06/20 0709)  . ceFEPime (MAXIPIME) IV Stopped (04/06/20 0424)  . dexmedetomidine (PRECEDEX) IV infusion Stopped (04/06/20 0318)  . fentaNYL infusion INTRAVENOUS 50 mcg/hr (04/06/20 1210)  . propofol (DIPRIVAN) infusion 50 mcg/kg/min (04/06/20 1210)  . vancomycin     PRN Meds:.docusate sodium, fentaNYL, polyethylene glycol   Interim History / Subjective:   sedated / mild air trapping on PSV  On weaning trial  This am  Objective   Blood pressure (!) 98/48, pulse 62, temperature (!) 96.9 F (36.1  C), temperature source Axillary, resp. rate 18, height '5\' 8"'  (1.727 m), weight 63.5 kg, SpO2 95 %.    Vent Mode: PSV;CPAP FiO2 (%):  [40 %-100 %] 40 % Set Rate:  [16 bmp] 16 bmp Vt Set:  [480 mL-540 mL] 540 mL PEEP:  [5 cmH20] 5 cmH20 Pressure Support:  [8  cmH20] 8 cmH20 Plateau Pressure:  [13 MVE72-09 cmH20] 13 cmH20   Intake/Output Summary (Last 24 hours) at 04/06/2020 1219 Last data filed at 04/06/2020 1210 Gross per 24 hour  Intake 662.58 ml  Output 525 ml  Net 137.58 ml   Filed Weights   04/05/20 2151 04/06/20 0300  Weight: 64 kg 63.5 kg    Examination: General: sedated on vent Tmax  98.3* No jvd Oropharynx et  Neck supple Lungs with distant bs  Bilaterally s wheeze but air trapping  RRR no s3 or or sign murmur Abd mildly distended, limited excursion   - massive splenomegaly  Extr warm with no edema or clubbing noted/ PAS in place     I personally reviewed images and agree with radiology impression as follows:  CXR:  12/19  Endotracheal tube in satisfactory position.  Nasogastric tube courses into the stomach, although the distal tip is not visualized on this exam.  Diffuse interstitial infiltrates/edema, with worsening airspace disease in both lung bases.    Resolved Hospital Problem list     Assessment & Plan:  Myelofibrosis- acute leukemoid reaction vs sepsis causing acute leukocytosis. -Per recent admission at [facility]; last bone marrow biopsy obtained 02/26/2020 consistent with myelofibrosis with no evidence of transformation to acute leukemia.  Patient is on Febratinib and Danazol.  Anemia - chronic Thrombocytopenia- chronic   Lab Results  Component Value Date   HGB 7.4 (L) 04/06/2020   HGB 8.6 (L) 04/05/2020   HGB 7.4 (L) 08/07/2019    Lab Results  Component Value Date   PLT 13 (LL) 04/06/2020   PLT 32 (L) 04/05/2020   PLT 64 (L) 08/07/2019    -CBC charge from [facility] 12/17 included WBC 38, hemoglobin 7.8, platelets 5.  CBC on this admission currently pending P: Follow-up on CBC differential. Discussed with Oncology- likely not responsive to leukopheresis if it is mature myelocytes rather than immature blasts. -Transfuse for hemoglobin goal greater than 7 and platelet  goal greater than 10 -Oncology/hematology consulted-  Recommending transfer to [facility]. Transfer request has been placed with transfer center at [facility]. -Monitor for signs of bleeding -Continue supportive care -Empiric antibiotics per flowsheet   Acute Hypoxic Respiratory Failure. Concern that a complication from myelofibrosis- acute cardiogenic pulmonary edema/TACO vs TRALI vs leukostasis is causing his respiratory failure rather than COPD though he is GOLD II and was still smoking until 4 weeks pta per wife  -Continue ventilator support with lung protective strategies  -Wean PEEP and FiO2 for sats greater than 90%. -Plateau pressures less than 30 cm H20 & DP <15 -VAP bundle in place  -PAD protocol for sedation. Restraints PRN if ETT threatened. -Empiric antibioics for CAP in immunocompromised host- note PCT elevated   History of COPD- FEV1 52% predicted. -Home medications were just stiolto and prn saba per wife  P: -Ventilator support as above -Continue bronchodilators & steroids -Supportive care  Hypothyroidism -Home medication includes Synthroid P: -Continue IV Synthroid       Best practice (evaluated daily)  Diet: N.p.o. consider initiation of tube feeds in a.m. Pain/Anxiety/Delirium protocol (if indicated): As needed's VAP protocol (if indicated): In place  DVT prophylaxis:  PAS GI prophylaxis: PPI Glucose control: Monitor Mobility: Bedrest Disposition: ICU  Goals of Care:  Last date of multidisciplinary goals of care discussion: wife at bedside am 12/19  Family and staff present: Pending Summary of discussion: Pending Follow up goals of care discussion due: Pending Code Status: Full  Labs   CBC: Recent Labs  Lab 04/05/20 2240 04/06/20 0713  WBC 100.7* 55.9*  HGB 8.6* 7.4*  HCT 27.7* 24.6*  MCV 102.6* 101.2*  PLT 32* 13*    Basic Metabolic Panel: Recent Labs  Lab 04/05/20 2240 04/06/20 0713  NA 137 135  K 4.9 4.3  CL 100 98  CO2 24 26  GLUCOSE 233*  199*  BUN 32* 40*  CREATININE 0.95 0.87  CALCIUM 8.5* 8.5*  MG  --  2.6*  PHOS  --  5.1*   GFR: Estimated Creatinine Clearance (by C-G formula based on SCr of 0.87 mg/dL) Male: 55.1 mL/min Male: 64.9 mL/min Recent Labs  Lab 04/05/20 2240 04/06/20 0713  PROCALCITON  --  4.87  WBC 100.7* 55.9*    Liver Function Tests: No results for input(s): AST, ALT, ALKPHOS, BILITOT, PROT, ALBUMIN in the last 168 hours. No results for input(s): LIPASE, AMYLASE in the last 168 hours. No results for input(s): AMMONIA in the last 168 hours.  ABG    Component Value Date/Time   PHART 7.366 04/06/2020 0425   PCO2ART 49.4 (H) 04/06/2020 0425   PO2ART 112 (H) 04/06/2020 0425   HCO3 27.9 04/06/2020 0425   TCO2 23 05/07/2017 1000   O2SAT 98.7 04/06/2020 0425     Coagulation Profile: No results for input(s): INR, PROTIME in the last 168 hours.  Cardiac Enzymes: No results for input(s): CKTOTAL, CKMB, CKMBINDEX, TROPONINI in the last 168 hours.  HbA1C: No results found for: HGBA1C  CBG: Recent Labs  Lab 04/06/20 0405 04/06/20 0816 04/06/20 0958 04/06/20 1208  GLUCAP 199* 142* 163* 135*    The patient is critically ill with multiple organ systems failure and requires high complexity decision making for assessment and support, frequent evaluation and titration of therapies, application of advanced monitoring technologies and extensive interpretation of multiple databases. Critical Care Time devoted to patient care services described in this note is 45 minutes.   Christinia Gully, MD Pulmonary and [place] [phone]   After 7:00 pm call Elink  [phone]

## 2020-04-06 NOTE — ED Notes (Signed)
Report called to brittany rn

## 2020-04-06 NOTE — Progress Notes (Signed)
Abg obtained on the following vent settings: PRVC mode, VT 556ml, rr16, 50% +5peep.  Results for JENARO, SOUDER (MRN [id number]) as of 04/06/2020 04:59  Ref. Range 04/06/2020 04:25  Sample type Unknown RIGHT RADIAL  Delivery systems Unknown VENTILATOR  FIO2 Unknown 50.00  Mode Unknown PRESSURE REGULATED VOLUME CONTROL  VT Latest Units: mL 540  Peep/cpap Latest Units: cm H20 5.0  pH, Arterial Latest Ref Range: 7.350 - 7.450  7.366  pCO2 arterial Latest Ref Range: 32.0 - 48.0 mmHg 49.4 (H)  pO2, Arterial Latest Ref Range: 83.0 - 108.0 mmHg 112 (H)  Acid-Base Excess Latest Ref Range: 0.0 - 2.0 mmol/L 2.6 (H)  Bicarbonate Latest Ref Range: 20.0 - 28.0 mmol/L 27.9  O2 Saturation Latest Units: % 98.7  Patient temperature Unknown 36.3  Allens test (pass/fail) Latest Ref Range: PASS  PASS

## 2020-04-06 NOTE — Progress Notes (Signed)
eLink Physician-Brief Progress Note Patient Name: Carlos Vaughn DOB: 12-04-1945 MRN: [id number]   Date of Service  04/06/2020  HPI/Events of Note  Agitation - Patient grabbing for ETT.   eICU Interventions  Plan: 1. Bilateral soft wrist restraints X 7 hours.         [name] 04/06/2020, 3:13 AM

## 2020-04-07 ENCOUNTER — Inpatient Hospital Stay (HOSPITAL_COMMUNITY): Payer: No Typology Code available for payment source

## 2020-04-07 DIAGNOSIS — J441 Chronic obstructive pulmonary disease with (acute) exacerbation: Secondary | ICD-10-CM

## 2020-04-07 DIAGNOSIS — J810 Acute pulmonary edema: Secondary | ICD-10-CM

## 2020-04-07 LAB — GLUCOSE, CAPILLARY
Glucose-Capillary: 104 mg/dL — ABNORMAL HIGH (ref 70–99)
Glucose-Capillary: 139 mg/dL — ABNORMAL HIGH (ref 70–99)
Glucose-Capillary: 298 mg/dL — ABNORMAL HIGH (ref 70–99)
Glucose-Capillary: 84 mg/dL (ref 70–99)
Glucose-Capillary: 86 mg/dL (ref 70–99)
Glucose-Capillary: 91 mg/dL (ref 70–99)
Glucose-Capillary: 97 mg/dL (ref 70–99)

## 2020-04-07 LAB — BASIC METABOLIC PANEL
Anion gap: 12 (ref 5–15)
BUN: 49 mg/dL — ABNORMAL HIGH (ref 8–23)
CO2: 27 mmol/L (ref 22–32)
Calcium: 9.1 mg/dL (ref 8.9–10.3)
Chloride: 101 mmol/L (ref 98–111)
Creatinine, Ser: 0.84 mg/dL (ref 0.61–1.24)
GFR, Estimated: >60 mL/min (ref >60)
Glucose, Bld: 121 mg/dL — ABNORMAL HIGH (ref 70–99)
Potassium: 4.8 mmol/L (ref 3.5–5.1)
Sodium: 140 mmol/L (ref 135–145)

## 2020-04-07 LAB — COMPREHENSIVE METABOLIC PANEL
ALT: 95 U/L — ABNORMAL HIGH (ref 0–44)
AST: 39 U/L (ref 15–41)
Albumin: 3 g/dL — ABNORMAL LOW (ref 3.5–5.0)
Alkaline Phosphatase: 177 U/L — ABNORMAL HIGH (ref 38–126)
Anion gap: 10 (ref 5–15)
BUN: 42 mg/dL — ABNORMAL HIGH (ref 8–23)
CO2: 26 mmol/L (ref 22–32)
Calcium: 8.7 mg/dL — ABNORMAL LOW (ref 8.9–10.3)
Chloride: 104 mmol/L (ref 98–111)
Creatinine, Ser: 0.96 mg/dL (ref 0.61–1.24)
GFR, Estimated: >60 mL/min (ref >60)
Glucose, Bld: 81 mg/dL (ref 70–99)
Potassium: 4.4 mmol/L (ref 3.5–5.1)
Sodium: 140 mmol/L (ref 135–145)
Total Bilirubin: 2.1 mg/dL — ABNORMAL HIGH (ref 0.3–1.2)
Total Protein: 5.5 g/dL — ABNORMAL LOW (ref 6.5–8.1)

## 2020-04-07 LAB — CBC WITH DIFFERENTIAL/PLATELET
Abs Immature Granulocytes: 13.8 10*3/uL — ABNORMAL HIGH (ref 0.00–0.07)
Band Neutrophils: 3 %
Basophils Absolute: 7.9 10*3/uL — ABNORMAL HIGH (ref 0.0–0.1)
Basophils Relative: 8 %
Blasts: 6 %
Eosinophils Absolute: 0 10*3/uL (ref 0.0–0.5)
Eosinophils Relative: 0 %
HCT: 29.4 % — ABNORMAL LOW (ref 39.0–52.0)
Hemoglobin: 9.2 g/dL — ABNORMAL LOW (ref 13.0–17.0)
Lymphocytes Relative: 3 %
Lymphs Abs: 3 10*3/uL (ref 0.7–4.0)
MCH: 31.7 pg (ref 26.0–34.0)
MCHC: 31.3 g/dL (ref 30.0–36.0)
MCV: 101.4 fL — ABNORMAL HIGH (ref 80.0–100.0)
Metamyelocytes Relative: 2 %
Monocytes Absolute: 4.9 10*3/uL — ABNORMAL HIGH (ref 0.1–1.0)
Monocytes Relative: 5 %
Myelocytes: 12 %
Neutro Abs: 63.1 10*3/uL — ABNORMAL HIGH (ref 1.7–7.7)
Neutrophils Relative %: 61 %
Platelets: 22 10*3/uL — CL (ref 150–400)
RBC: 2.9 MIL/uL — ABNORMAL LOW (ref 4.22–5.81)
RDW: 21.2 % — ABNORMAL HIGH (ref 11.5–15.5)
WBC: 98.6 10*3/uL (ref 4.0–10.5)
nRBC: 30.9 % — ABNORMAL HIGH (ref 0.0–0.2)

## 2020-04-07 LAB — BRAIN NATRIURETIC PEPTIDE: B Natriuretic Peptide: 559.3 pg/mL — ABNORMAL HIGH (ref 0.0–100.0)

## 2020-04-07 LAB — PROCALCITONIN: Procalcitonin: 6.01 ng/mL

## 2020-04-07 MED ORDER — PHENYLEPHRINE 40 MCG/ML (10ML) SYRINGE FOR IV PUSH (FOR BLOOD PRESSURE SUPPORT)
PREFILLED_SYRINGE | INTRAVENOUS | Status: AC
  Filled 2020-04-07: qty 10

## 2020-04-07 MED ORDER — FUROSEMIDE 10 MG/ML IJ SOLN
40.0000 mg | Freq: Once | INTRAMUSCULAR | Status: AC
  Administered 2020-04-07: 10:00:00 40 mg via INTRAVENOUS
  Filled 2020-04-07: qty 4

## 2020-04-07 MED ORDER — TRAZODONE HCL 50 MG PO TABS
150.0000 mg | ORAL_TABLET | Freq: Every evening | ORAL | Status: DC | PRN
  Administered 2020-04-07 – 2020-04-09 (×3): 150 mg via ORAL
  Filled 2020-04-07 (×3): qty 3

## 2020-04-07 MED ORDER — INSULIN ASPART 100 UNIT/ML ~~LOC~~ SOLN: 0.0000 [IU] | Freq: Three times a day (TID) | SUBCUTANEOUS | Status: DC

## 2020-04-07 MED ORDER — SODIUM CHLORIDE 0.9 % IV SOLN
2.0000 g | Freq: Three times a day (TID) | INTRAVENOUS | Status: DC
  Administered 2020-04-07 – 2020-04-10 (×10): 2 g via INTRAVENOUS
  Filled 2020-04-07 (×11): qty 2

## 2020-04-07 MED ORDER — INSULIN ASPART 100 UNIT/ML ~~LOC~~ SOLN
0.0000 [IU] | Freq: Three times a day (TID) | SUBCUTANEOUS | Status: DC
  Administered 2020-04-07: 19:00:00 8 [IU] via SUBCUTANEOUS
  Administered 2020-04-08 (×2): 2 [IU] via SUBCUTANEOUS

## 2020-04-07 MED ORDER — INSULIN ASPART 100 UNIT/ML ~~LOC~~ SOLN: 0.0000 [IU] | Freq: Every day | SUBCUTANEOUS | Status: DC

## 2020-04-07 NOTE — Progress Notes (Signed)
eLink Physician-Brief Progress Note Patient Name: Carlos Vaughn DOB: [birth year] MRN: [id number]   Date of Service  04/07/2020  HPI/Events of Note  Patient requests sleep aid. Patient on Traxodone 150 mg Q HS PRN at home.   eICU Interventions  Plan: 10 Trazodone 150 mg PO Q HS PRN sleep.      Intervention Category Major Interventions: Other:  Lysle Dingwall 04/07/2020, 11:18 PM

## 2020-04-07 NOTE — Progress Notes (Addendum)
NAME:  Carlos Vaughn, MRN:  [id number], DOB:  19-Apr-1946, LOS: 2 ADMISSION DATE:  04/05/2020, CONSULTATION DATE:  04/05/2020 REFERRING MD:  Dr. Sonny Dandy , CHIEF COMPLAINT:  Respiratory distress   Brief History:  74 yo male with GOLD II copd/ Group B last smoked about 4 week PTA presented with complaints of SOB who developed respiratory arrest requiring intubation. PCCM consult for admission.   Past Medical History:  COPD- FEV1 52% predicted Myelofibrosis Hyperlipidemia DVT Anxiety Hemorrhagic CVA  Significant Hospital Events:  Admitted for shortness of breath, suffered a respiratory arrest in ED 12/18> intubated.broad spec abx started. Systemic steroids started. Requested xfer to [facility].  12/19: Looked good on pressure support ventilation however decision made to hold off on extubation based on worsening chest x-ray 12/ 20 excellent ventilator mechanics, tolerating pressure support, IV Lasix administered.  Stopped vancomycin as PCR negative. Spoke w/ Dr Mariea Clonts. He has burnt out myelofibrosis. Pushes out blasts 5-10% usually sits 3-5%.   Consults:  Oncology  Procedures:  ET tube 12/18 >12/20  Significant Diagnostic Tests:  Chest x-ray 12/18 > mild interstitial edema  Micro Data:  COVID 12/18 >   negative Flu A&B 12/18  > negative Blood cultures 12/18> MRSA  12/19 neg  Trach aspirate 12/19>  Antimicrobials:  vanc 12/19>12/20 Cefepime 12/19> Azithromycin 12/19>  Interim History / Subjective:  Looks comfortable on PSV  Objective   Blood pressure (Abnormal) 116/59, pulse 65, temperature 99.32 F (37.4 C), temperature source Bladder, resp. rate 16, height '5\' 8"'  (1.727 m), weight 63.5 kg, SpO2 94 %.    Vent Mode: PRVC FiO2 (%):  [40 %] 40 % Set Rate:  [16 bmp] 16 bmp Vt Set:  [540 mL] 540 mL PEEP:  [5 cmH20] 5 cmH20 Pressure Support:  [5 cmH20-8 cmH20] 5 cmH20 Plateau Pressure:  [14 cmH20-17 cmH20] 15 cmH20   Intake/Output Summary (Last 24 hours) at 04/07/2020  4696 Last data filed at 04/07/2020 0703 Gross per 24 hour  Intake 1525.39 ml  Output 1275 ml  Net 250.39 ml   Filed Weights   04/05/20 2151 04/06/20 0300 04/07/20 0500  Weight: 64 kg 63.5 kg 63.5 kg    Examination: General 74 year old white male currently on ventilatory support HEENT normocephalic atraumatic no jugular venous distention noted, orally intubated.  Pupils equal reactive nonicteric Pulmonary: Clear bilaterally, current tidal volume in excess of 450 cc, respiratory rate 12, no accessory use saturations high 90s Cardiac regular rhythm, heart rate 29B, systolic murmur is appreciated Abdomen soft with exception of left upper quadrant organomegaly consistent with previously noted splenomegaly bowel sounds present minimal dark bilious to borderline coffee-ground nasogastric aspirate Extremities warm and dry with brisk capillary refill no significant edema GU clear yellow  Resolved Hospital Problem list     Assessment & Plan:   Myelofibrosis- acute leukemoid reaction vs sepsis causing acute leukocytosis. -Per recent admission at [facility]; last bone marrow biopsy obtained 02/26/2020 consistent with myelofibrosis with no evidence of transformation to acute leukemia.  Patient is on Febratinib and Danazol.  -wbc ct cont to rise Plan Cont empiric abx Transfuse to [facility] when bed available  Will reach out to [facility] daily  Anemia - chronic Had 1 gm drop from 12/18 to 12/19 Plan Ck cbc  Transfuse for hgb < 7 SCD PPI  Thrombocytopenia- chronic  -CBC change from [facility] 12/17 included WBC 38, hemoglobin 7.8, platelets 5.   -last PLT 12/19: 13K -Oncology/hematology consulted-  Recommending transfer to [facility].  Plan Repeat  cbc this am Transfuse PLTs for PLT < 10 or if has active bleeding No AC   Acute Hypoxic Respiratory Failure. Concern that a complication from myelofibrosis- acute cardiogenic pulmonary edema/TACO vs TRALI vs leukostasis  Portable  chest x-ray as of 12/19: This demonstrated the following, the endotracheal tube was in satisfactory position, could be retracted about 2 cm.  Diffuse bilateral airspace disease persists, comparing film from the 18th aeration perhaps a little worse Plan Continue full ventilator support, with low tidal volume ventilation Continue wean PEEP and FiO2 Plateau pressure goal is less than 30, driving pressure goal is less than 15 PAD protocol, can change RASS to -1 to -2 Cont systemic steroids d/c today (per Dr Mariea Clonts w/ onc at [facility]) VAP bundle Day #2 vancomycin, cefepime, and azithromycin->DC vanc as PCR neg; Follow-up cultures Daily assessment for lasix (will give now) Chest x-ray now and in a.m. Daily assessment for diuretics  History of gold II COPD- FEV1 52% predicted. Still smoking 4 weeks prior to admission -Home medications were just stiolto and prn saba per wife  Plan Continue bronchodilators Steroids addressed above   Hypothyroidism Plan Continue IV Synthroid       Best practice (evaluated daily)  Diet: Continue n.p.o. status as of 12/20, if fails extubation would initiate tube feeds Pain/Anxiety/Delirium protocol (if indicated): 12/18 initiated on sedation protocol, RASS goal changes 0 effective 12/20 VAP protocol (if indicated): In place DVT prophylaxis:  PAS GI prophylaxis: PPI Glucose control: Monitor Mobility: Bedrest Disposition: ICU  Goals of Care:  Last date of multidisciplinary goals of care discussion: wife at bedside am 12/19  Family and staff present: Pending Summary of discussion: Pending Follow up goals of care discussion due: Pending Code Status: Full  My critical care time 32 minutes  Erick Colace ACNP-BC [place] Pager # [phone] OR # [phone] if no answer    I, Dr. Shellee Milo, have personally reviewed patient's available data, including medical history, events of note, physical examination and test results as part of  my evaluation. I have discussed with NP Kary Kos  and other care providers all aspects of the patients PCCM issues as listed.  In addition,  I have personally evaluated the patient and assisted in the formulation of the management plan.

## 2020-04-07 NOTE — Procedures (Signed)
Extubation Procedure Note  Patient Details:   Name: Carlos Vaughn DOB: 02-Feb-1946 MRN: [id number]   Airway Documentation:    Vent end date: 04/07/20 Vent end time: 1144   Evaluation  O2 sats: stable throughout Complications: No apparent complications Patient did tolerate procedure well. Bilateral Breath Sounds: Clear,Diminished   Yes   Patient was extubated to a 4L [place]. Cuff leak was heard. No stridor was noted. RN and daughter at bedside during extubation. Patient tolerated well.  Renato Gails [facility] 04/07/2020, 11:45 AM

## 2020-04-07 NOTE — Progress Notes (Signed)
NUTRITION NOTE  Patient screened for new vent. Patient was successfully extubated today at ~1145. He did not screen for any other nutrition-related criteria. MST score of 1.   Will not follow at this time. If nutrition-related needs arise, please consult RD.     Jarome Matin, MS, RD, LDN, CNSC Inpatient Clinical Dietitian RD pager # available in [place]  After hours/weekend pager # available in [facility]

## 2020-04-07 NOTE — Progress Notes (Signed)
PCCM Brief Progress Note   Asked to evaluate pt due to indurated, somewhat tender area on abdomen noted on shift. 10-15 cm mass palpated over LUQ likely corresponds to massive splenomegaly noted on OSH CT A/P last week. He has been hemodynamically stable. Defer any further evaluation to day PCCM team.  Doristine Devoid Pulmonary/Critical Care

## 2020-04-07 NOTE — Progress Notes (Signed)
PHARMACY NOTE:  ANTIMICROBIAL RENAL DOSAGE ADJUSTMENT  Current antimicrobial regimen includes a mismatch between antimicrobial dosage and estimated renal function.  As per policy approved by the Pharmacy & Therapeutics and Medical Executive Committees, the antimicrobial dosage will be adjusted accordingly.  Current antimicrobial dosage:  Cefepime 2 g iv q 12 hours  Indication: CAP in immunocompromised host  Renal Function:  Estimated Creatinine Clearance (by C-G formula based on SCr of 0.87 mg/dL) Male: 55.1 mL/min Male: 64.9 mL/min []      On intermittent HD, scheduled: []      On CRRT    Antimicrobial dosage has been changed to:  Cefepime 2 g iv q 8 hours  Additional comments:   Thank you for allowing pharmacy to be a part of this patient's care.  Napoleon Form, [facility] 04/07/2020 7:57 AM

## 2020-04-08 ENCOUNTER — Inpatient Hospital Stay (HOSPITAL_COMMUNITY): Payer: No Typology Code available for payment source

## 2020-04-08 LAB — COMPREHENSIVE METABOLIC PANEL
ALT: 94 U/L — ABNORMAL HIGH (ref 0–44)
AST: 34 U/L (ref 15–41)
Albumin: 3.1 g/dL — ABNORMAL LOW (ref 3.5–5.0)
Alkaline Phosphatase: 203 U/L — ABNORMAL HIGH (ref 38–126)
Anion gap: 9 (ref 5–15)
BUN: 49 mg/dL — ABNORMAL HIGH (ref 8–23)
CO2: 26 mmol/L (ref 22–32)
Calcium: 8.9 mg/dL (ref 8.9–10.3)
Chloride: 98 mmol/L (ref 98–111)
Creatinine, Ser: 0.8 mg/dL (ref 0.61–1.24)
GFR, Estimated: >60 mL/min (ref >60)
Glucose, Bld: 159 mg/dL — ABNORMAL HIGH (ref 70–99)
Potassium: 4.6 mmol/L (ref 3.5–5.1)
Sodium: 133 mmol/L — ABNORMAL LOW (ref 135–145)
Total Bilirubin: 2.4 mg/dL — ABNORMAL HIGH (ref 0.3–1.2)
Total Protein: 5.7 g/dL — ABNORMAL LOW (ref 6.5–8.1)

## 2020-04-08 LAB — CBC
HCT: 25.9 % — ABNORMAL LOW (ref 39.0–52.0)
Hemoglobin: 8.1 g/dL — ABNORMAL LOW (ref 13.0–17.0)
MCH: 31.5 pg (ref 26.0–34.0)
MCHC: 31.3 g/dL (ref 30.0–36.0)
MCV: 100.8 fL — ABNORMAL HIGH (ref 80.0–100.0)
Platelets: 15 10*3/uL — CL (ref 150–400)
RBC: 2.57 MIL/uL — ABNORMAL LOW (ref 4.22–5.81)
RDW: 20.5 % — ABNORMAL HIGH (ref 11.5–15.5)
WBC: 71.6 10*3/uL (ref 4.0–10.5)
nRBC: 26.3 % — ABNORMAL HIGH (ref 0.0–0.2)

## 2020-04-08 LAB — GLUCOSE, CAPILLARY
Glucose-Capillary: 148 mg/dL — ABNORMAL HIGH (ref 70–99)
Glucose-Capillary: 126 mg/dL — ABNORMAL HIGH (ref 70–99)
Glucose-Capillary: 110 mg/dL — ABNORMAL HIGH (ref 70–99)
Glucose-Capillary: 148 mg/dL — ABNORMAL HIGH (ref 70–99)

## 2020-04-08 LAB — PATHOLOGIST SMEAR REVIEW

## 2020-04-08 MED ORDER — ALLOPURINOL 300 MG PO TABS
300.0000 mg | ORAL_TABLET | Freq: Every day | ORAL | Status: DC
  Administered 2020-04-08 – 2020-04-10 (×3): 300 mg via ORAL
  Filled 2020-04-08: qty 3
  Filled 2020-04-08: qty 1
  Filled 2020-04-08: qty 3

## 2020-04-08 MED ORDER — DILTIAZEM HCL-DEXTROSE 125-5 MG/125ML-% IV SOLN (PREMIX)
5.0000 mg/h | INTRAVENOUS | Status: DC
  Administered 2020-04-08: 15:00:00 5 mg/h via INTRAVENOUS
  Administered 2020-04-09: 01:00:00 2.5 mg/h via INTRAVENOUS
  Filled 2020-04-08 (×2): qty 125

## 2020-04-08 MED ORDER — FUROSEMIDE 10 MG/ML IJ SOLN
40.0000 mg | Freq: Once | INTRAMUSCULAR | Status: AC
  Administered 2020-04-08: 11:00:00 40 mg via INTRAVENOUS
  Filled 2020-04-08: qty 4

## 2020-04-08 MED ORDER — LEVOTHYROXINE SODIUM 75 MCG PO TABS
75.0000 ug | ORAL_TABLET | Freq: Every day | ORAL | Status: DC
  Administered 2020-04-09 – 2020-04-10 (×2): 75 ug via ORAL
  Filled 2020-04-08 (×2): qty 1

## 2020-04-08 MED ORDER — PANTOPRAZOLE SODIUM 40 MG PO TBEC
40.0000 mg | DELAYED_RELEASE_TABLET | Freq: Every day | ORAL | Status: DC
  Administered 2020-04-08 – 2020-04-10 (×3): 40 mg via ORAL
  Filled 2020-04-08 (×3): qty 1

## 2020-04-08 MED ORDER — LORAZEPAM 0.5 MG PO TABS
0.5000 mg | ORAL_TABLET | ORAL | Status: DC | PRN
  Administered 2020-04-08: 03:00:00 0.5 mg via ORAL
  Filled 2020-04-08: qty 1

## 2020-04-08 MED ORDER — THIAMINE HCL 100 MG PO TABS
100.0000 mg | ORAL_TABLET | Freq: Every day | ORAL | Status: DC
  Administered 2020-04-08 – 2020-04-10 (×3): 100 mg via ORAL
  Filled 2020-04-08 (×4): qty 1

## 2020-04-08 MED ORDER — TRAMADOL HCL 50 MG PO TABS
50.0000 mg | ORAL_TABLET | Freq: Every day | ORAL | Status: DC
  Administered 2020-04-08 – 2020-04-09 (×2): 50 mg via ORAL
  Filled 2020-04-08 (×2): qty 1

## 2020-04-08 MED ORDER — TIOTROPIUM BROMIDE MONOHYDRATE 18 MCG IN CAPS: 18.0000 ug | ORAL_CAPSULE | Freq: Every day | RESPIRATORY_TRACT | Status: DC

## 2020-04-08 MED ORDER — PRAZOSIN HCL 1 MG PO CAPS
2.0000 mg | ORAL_CAPSULE | Freq: Every day | ORAL | Status: DC
  Administered 2020-04-08: 21:00:00 2 mg via ORAL
  Filled 2020-04-08 (×3): qty 2

## 2020-04-08 MED ORDER — UMECLIDINIUM BROMIDE 62.5 MCG/INH IN AEPB
1.0000 | INHALATION_SPRAY | Freq: Every day | RESPIRATORY_TRACT | Status: DC
  Administered 2020-04-09 – 2020-04-10 (×2): 1 via RESPIRATORY_TRACT
  Filled 2020-04-08: qty 7

## 2020-04-08 MED ORDER — LORAZEPAM 0.5 MG PO TABS
0.5000 mg | ORAL_TABLET | Freq: Every day | ORAL | Status: DC
  Administered 2020-04-08 – 2020-04-09 (×2): 0.5 mg via ORAL
  Filled 2020-04-08 (×3): qty 1

## 2020-04-08 NOTE — Progress Notes (Signed)
Pt sitting in chair and heart rate increasing to the 140's to 150's. Pt denies chest pain or sob. B/P 115/83,  ekg done and reviewed by Dr Melvyn Novas. Cardizem ordered for a-fib and transfer cancelled at this time. Will continue to monitor

## 2020-04-08 NOTE — Progress Notes (Signed)
eLink Physician-Brief Progress Note Patient Name: Macklin Jacquin DOB: July 25, 1945 MRN: [id number]   Date of Service  04/08/2020  HPI/Events of Note  Patient requests home Ultram 50 mg PO Q HS.   eICU Interventions  Plan: 1. Ultram 50 mg PO Q HS.     Intervention Category Major Interventions: Other:  Lysle Dingwall 04/08/2020, 9:58 PM

## 2020-04-08 NOTE — Consult Note (Addendum)
WOC Nurse Consult Note: Reason for Consult: Consult requested for deep tissue pressure injury to sacrum.  Pt has multiple dark reddish purple bruises to his left posterior chest and to arms and legs related to systemic factors with his disease process.  Wound type: DTPI to sacrum is dark red-purple, 1X1cm,  appearance and location are consistent with an area with prolonged positioning/pressure during EMS transport prior to admission. Discussed plan of care with patient's family member at the bedside and assessed wound appearance together and discussed importance of keeping pt turned off the affected area to reduce pressure. Pt plans to discharge soon, they report, and informed them to report changes to a physician if they occur as the area evolves; drainage, odor, yellow slough or black eschar. She verbalized understanding. Pressure Injury POA: No Dressing procedure/placement/frequency: Pt is on a low air loss mattress to reduce pressure.  Topical treatment orders provided for bedside nurses to perform as follows: Foam dressing to sacrum; change Q 3 days or PRN soiling WOC team will reassess the location next week if the patient is still in the hospital at that time.  Julien Girt MSN, RN, [place], [place], [place]

## 2020-04-08 NOTE — TOC Initial Note (Signed)
Transition of Care [facility]) - Initial/Assessment Note    Patient Details  Name: Carlos Vaughn MRN: [id number] Date of Birth: 06-13-1945  Transition of Care [facility] CM/SW Contact:    Leeroy Cha, RN Phone Number: 04/08/2020, 8:41 AM  Clinical Narrative:                 74 yo male with GOLD II copd/ Group B last smoked about 4 week PTA presented with complaints of SOB who developed respiratory arrest requiring intubation. PCCM consult for admission.   Past Medical History:  COPD- FEV1 52% predicted Myelofibrosis Hyperlipidemia DVT Anxiety Hemorrhagic CVA  Significant Hospital Events:  Admitted for shortness of breath, suffered a respiratory arrest in ED 12/18> intubated.broad spec abx started. Systemic steroids started. Requested xfer to [facility].  12/19: Looked good on pressure support ventilation however decision made to hold off on extubation based on worsening chest x-ray 12/ 20 excellent ventilator mechanics, tolerating pressure support, IV Lasix administered.  Stopped vancomycin as PCR negative. Spoke w/ Dr Mariea Clonts. He has burnt out myelofibrosis. Pushes out blasts 5-10% usually sits 3-5%.   EXTUBATED [id number] Plan to follow for toc needs and progression.  Patient is his own poa,  Expected Discharge Plan: Home/Self Care Barriers to Discharge: No Barriers Identified   Patient Goals and CMS Choice Patient states their goals for this hospitalization and ongoing recovery are:: to go home CMS Medicare.gov Compare Post Acute Care list provided to:: Patient    Expected Discharge Plan and Services Expected Discharge Plan: Home/Self Care   Discharge Planning Services: CM Consult   Living arrangements for the past 2 months: Single Family Home                                      Prior Living Arrangements/Services Living arrangements for the past 2 months: Single Family Home Lives with:: Spouse Patient language and need for interpreter reviewed:: Yes Do you  feel safe going back to the place where you live?: Yes      Need for Family Participation in Patient Care: Yes (Comment) Care giver support system in place?: Yes (comment)   Criminal Activity/Legal Involvement Pertinent to Current Situation/Hospitalization: No - Comment as needed  Activities of Daily Living Home Assistive Devices/Equipment: Eyeglasses,Nebulizer ADL Screening (condition at time of admission) Patient's cognitive ability adequate to safely complete daily activities?: Yes Is the patient deaf or have difficulty hearing?: No Does the patient have difficulty seeing, even when wearing glasses/contacts?: No Does the patient have difficulty concentrating, remembering, or making decisions?: No Patient able to express need for assistance with ADLs?: No (patient currently intubated) Does the patient have difficulty dressing or bathing?: Yes Independently performs ADLs?: No Communication: Independent Is this a change from baseline?: Change from baseline, expected to last >3 days Dressing (OT): Needs assistance Is this a change from baseline?: Change from baseline, expected to last >3 days Grooming: Needs assistance Is this a change from baseline?: Change from baseline, expected to last >3 days Feeding: Needs assistance Is this a change from baseline?: Change from baseline, expected to last >3 days Bathing: Needs assistance Is this a change from baseline?: Change from baseline, expected to last >3 days Toileting: Needs assistance Is this a change from baseline?: Change from baseline, expected to last >3days In/Out Bed: Needs assistance Is this a change from baseline?: Change from baseline, expected to last >3 days Walks in Home: Needs assistance  Is this a change from baseline?: Change from baseline, expected to last >3 days Does the patient have difficulty walking or climbing stairs?: Yes (secondary to shortness of breath and weakness) Weakness of Legs: Both Weakness of  Arms/Hands: Both  Permission Sought/Granted                  Emotional Assessment Appearance:: Appears stated age Attitude/Demeanor/Rapport: Engaged Affect (typically observed): Calm Orientation: : Oriented to Place,Oriented to Self,Oriented to  Time,Oriented to Situation Alcohol / Substance Use: Not Applicable Psych Involvement: No (comment)  Admission diagnosis:  SOB (shortness of breath) [R06.02] COPD exacerbation (HCC) [J44.1] Acute respiratory failure with hypoxia ([place]) [J96.01] Patient Active Problem List   Diagnosis Date Noted  . Acute pulmonary edema (HCC)   . Acute respiratory failure with hypoxia ([place]) 04/05/2020  . Cigarette smoker 01/16/2020  . Bilateral leg edema 03/17/2019  . Bilateral lower extremity edema 03/16/2019  . COPD GOLD 2/ Group B   03/16/2019  . DOE (dyspnea on exertion) 03/16/2019  . Myelofibrosis ([place])   . Symptomatic anemia 03/15/2019  . COPD exacerbation ([place]) 03/15/2019  . Partial symptomatic epilepsy with complex partial seizures, not intractable, without status epilepticus ([place]) 07/21/2017  . Acute deep vein thrombosis (DVT) of distal vein of lower extremity (HCC)   . Acute deep vein thrombosis (DVT) of proximal vein of lower extremity (HCC)   . Subdural hematoma ([place])   . Chronic obstructive pulmonary disease ([place])   . PTSD (post-traumatic stress disorder)   . Seizures ([place])   . Leukocytosis   . Thrombocytopenia ([place])   . Anemia of chronic disease   . Subdural hemorrhage ([place]) 05/07/2017  . Anticoagulated for Rt Leg DVT 05/07/2017  . History of DVT of lower extremity 02/2017 05/07/2017   PCP:  Caralyn Guile, DO Pharmacy:   [id number] [address], [place] [address] Phone: [phone] Fax: [place], [place] [address] Phone: [phone] Fax: [place], [place] [phone] [place] [address] Phone: [phone] Fax: [phone]  [facility] [place], [place] - [address] [place] [address] Phone: [phone] Fax: [phone]     Social Determinants of Health (SDOH) Interventions    Readmission Risk Interventions No flowsheet data found.

## 2020-04-08 NOTE — Progress Notes (Signed)
NAME:  Carlos Vaughn, MRN:  [id number], DOB:  Apr 12, 1946, LOS: 3 ADMISSION DATE:  04/05/2020, CONSULTATION DATE:  04/05/2020 REFERRING MD:  Dr. Sonny Dandy , CHIEF COMPLAINT:  Respiratory distress   Brief History:  74 yo male with GOLD II copd/ Group B last smoked about 4 week PTA presented with complaints of SOB who developed respiratory arrest requiring intubation. PCCM consult for admission.   Past Medical History:  COPD- FEV1 52% predicted Myelofibrosis Hyperlipidemia DVT Anxiety Hemorrhagic CVA  Significant Hospital Events:  Admitted for shortness of breath, suffered a respiratory arrest in ED 12/18> intubated.broad spec abx started. Systemic steroids started. Requested xfer to [facility].  12/19: Looked good on pressure support ventilation however decision made to hold off on extubation based on worsening chest x-ray 12/ 20 excellent ventilator mechanics, tolerating pressure support, IV Lasix administered.  Stopped vancomycin as PCR negative. Spoke w/ Dr Mariea Clonts. He has burnt out myelofibrosis. Pushes out blasts 5-10% usually sits 3-5%. Extubated successfully  12/21: Working towards discharge.  Transfer to [place]:  Oncology  Procedures:  ET tube 12/18 >12/20  Significant Diagnostic Tests:   Chest x-ray 12/18 > mild interstitial edema  Micro Data:  COVID 12/18 >   negative Flu A&B 12/18  > negative Blood cultures 12/18> MRSA  12/19 neg  Trach aspirate 12/19>  Antimicrobials:  vanc 12/19>12/20 Cefepime 12/19> Azithromycin 12/19>  Interim History / Subjective:  Very comfortable no distress  Objective   Blood pressure (Abnormal) 131/53, pulse 73, temperature 98.2 F (36.8 C), temperature source Oral, resp. rate 20, height '5\' 8"'  (1.727 m), weight 63.5 kg, SpO2 97 %.        Intake/Output Summary (Last 24 hours) at 04/08/2020 1005 Last data filed at 04/08/2020 6294 Gross per 24 hour  Intake 665.39 ml  Output 2800 ml  Net -2134.61 ml   Filed Weights    04/06/20 0300 04/07/20 0500 04/08/20 0500  Weight: 63.5 kg 63.5 kg 63.5 kg    Examination: General very pleasant 74 year old white male sitting up in bed currently in no acute distress HEENT normocephalic atraumatic no jugular venous distention appreciated Pulmonary: Diminished bases no accessory use no wheezing, currently 3 L/min nasal cannula Cardiac: Regular rate and rhythm no murmur rub or gallop Abdomen: Soft nontender no organomegaly Extremities: Warm dry brisk cap refill multiple areas of ecchymosis GU: Voids spontaneously Neuro awake oriented no focal deficits.  Resolved Hospital Problem list     Assessment & Plan:   Myelofibrosis- acute leukemoid reaction vs sepsis causing acute leukocytosis. -Per recent admission at [facility]; last bone marrow biopsy obtained 02/26/2020 consistent with myelofibrosis with no evidence of transformation to acute leukemia.  Patient is on Febratinib and Danazol.  -wbc ct cont to rise, per my discussion with his primary oncologist he has quite advanced myelofibrosis Plan We will follow up at [facility]  Anemia - chronic Had 1 gm drop from 12/18 to 12/19 Plan Awaiting CBC Transfuse as indicated for hemoglobin less than 7  Thrombocytopenia- chronic  -CBC change from [facility] 12/17 included WBC 38, hemoglobin 7.8, platelets 5.   -last PLT 12/19: 13K -Oncology/hematology consulted-  Recommending transfer to [facility].  Plan Awaiting repeat CBC  No anticoagulation given risk of bleeding  May require transfusion prior to discharge  Acute Hypoxic Respiratory Failure. Concern that a complication from myelofibrosis- acute cardiogenic pulmonary edema/TACO vs TRALI vs leukostasis  More inclined after discussing with his oncologist to think this was acute diastolic heart failure with  pulmonary edema plus minus transfusion related lung injury  Portable chest x-ray today 12/21 personally reviewed: Bilateral patchy infiltrates persist  but improved, has some right basilar atelectasis.   Tolerated extubation 12/20 Plan Continue to wean oxygen  We will need to check walking oximetry  Continue bronchodilators as mentioned below  He is antibiotic day # 4, discontinue azithromycin, continue cefepime  Repeat IV Lasix today, if he is transfused may need another dose of Lasix  Discontinued steroids per my discussion with Dr. Mariea Clonts at [facility]  Repeat a.m. chest x-ray  History of gold II COPD- FEV1 52% predicted. Still smoking 4 weeks prior to admission -Home medications were just stiolto and prn saba per wife  Plan Resume Stiolto 2 puff daily at dc w/ PRN proair For now we will continue DuoNeb   Hypothyroidism Plan Resume home Synthroid       Best practice (evaluated daily)  Diet: Regular diet 12/21 Pain/Anxiety/Delirium protocol (if indicated): Discontinued 12/20 VAP protocol (if indicated): Discontinued 12/20 DVT prophylaxis:  PAS GI prophylaxis: PPI Glucose control: Monitor Mobility: Bedrest Disposition: ICU, moving to [place] 12/21  Goals of Care:  Last date of multidisciplinary goals of care discussion: wife at bedside am 12/19  Goals of care discussion had at bedside with wife.  Continues to be for aggressive care.  Hope is to get him home in the next 24 hours.  Wife updated as well as daughter both at bedside on 12/21 Follow up goals of care discussion due: Pending Code Status: Full  NA ccm time   Erick Colace ACNP-BC [place] Pager # [phone] OR # [phone] if no answer

## 2020-04-08 NOTE — Progress Notes (Signed)
This RN was called to patients room by patients daughter, she states her father "is not right" and she is concerned that he is having blurred vision. Patient states he has had blurred vision for two days, when this RN asked patient why he did not report this to this RN or Dr Melvyn Novas today he states "I don't know". Pt is alert and oriented and neuro exam by this RN and charge RN Darrick Meigs is negative. Dr Melvyn Novas was notified and this RN was given a verbal order to consult neurology for the blurred vision. Will continue to monitor patient

## 2020-04-08 NOTE — Plan of Care (Signed)

## 2020-04-08 NOTE — Progress Notes (Signed)
eLink Physician-Brief Progress Note Patient Name: Carlos Vaughn DOB: 1945/08/09 MRN: [id number]   Date of Service  04/08/2020  HPI/Events of Note  Anxiety - Wife reports that he gets Ativan at [facility] for this.   eICU Interventions  Plan: 1. Ativan 0.5 mg PO Q 4 hours PRN anxiety.      Intervention Category Major Interventions: Delirium, psychosis, severe agitation - evaluation and management  Edward Trevino [place] 04/08/2020, 1:25 AM

## 2020-04-09 ENCOUNTER — Inpatient Hospital Stay (HOSPITAL_COMMUNITY): Payer: No Typology Code available for payment source

## 2020-04-09 DIAGNOSIS — I4891 Unspecified atrial fibrillation: Secondary | ICD-10-CM

## 2020-04-09 LAB — ECHOCARDIOGRAM COMPLETE
Area-P 1/2: 2.62 cm2
Height: 68 in
MV VTI: 2.74 cm2
S' Lateral: 3.9 cm
Weight: 2229.29 oz

## 2020-04-09 LAB — CBC
HCT: 18.8 % — ABNORMAL LOW (ref 39.0–52.0)
Hemoglobin: 5.9 g/dL — CL (ref 13.0–17.0)
MCH: 32.1 pg (ref 26.0–34.0)
MCHC: 31.4 g/dL (ref 30.0–36.0)
MCV: 102.2 fL — ABNORMAL HIGH (ref 80.0–100.0)
Platelets: 9 10*3/uL — CL (ref 150–400)
RBC: 1.84 MIL/uL — ABNORMAL LOW (ref 4.22–5.81)
RDW: 20.4 % — ABNORMAL HIGH (ref 11.5–15.5)
WBC: 55.3 10*3/uL (ref 4.0–10.5)
nRBC: 29.7 % — ABNORMAL HIGH (ref 0.0–0.2)

## 2020-04-09 LAB — GLUCOSE, CAPILLARY
Glucose-Capillary: 92 mg/dL (ref 70–99)
Glucose-Capillary: 88 mg/dL (ref 70–99)
Glucose-Capillary: 110 mg/dL — ABNORMAL HIGH (ref 70–99)
Glucose-Capillary: 119 mg/dL — ABNORMAL HIGH (ref 70–99)

## 2020-04-09 LAB — PREPARE RBC (CROSSMATCH)

## 2020-04-09 MED ORDER — LORAZEPAM 0.5 MG PO TABS
0.5000 mg | ORAL_TABLET | Freq: Once | ORAL | Status: AC
  Administered 2020-04-09: 14:00:00 0.5 mg via ORAL

## 2020-04-09 MED ORDER — SODIUM CHLORIDE 0.9% IV SOLUTION: Freq: Once | INTRAVENOUS | Status: DC

## 2020-04-09 MED ORDER — ACETAMINOPHEN 325 MG PO TABS
650.0000 mg | ORAL_TABLET | Freq: Once | ORAL | Status: AC
  Administered 2020-04-09: 09:00:00 650 mg via ORAL
  Filled 2020-04-09: qty 2

## 2020-04-09 MED ORDER — LORAZEPAM 2 MG/ML IJ SOLN
INTRAMUSCULAR | Status: AC
  Filled 2020-04-09: qty 1

## 2020-04-09 MED ORDER — FUROSEMIDE 10 MG/ML IJ SOLN
40.0000 mg | Freq: Once | INTRAMUSCULAR | Status: AC
  Administered 2020-04-09: 13:00:00 40 mg via INTRAVENOUS
  Filled 2020-04-09: qty 4

## 2020-04-09 MED ORDER — DIPHENHYDRAMINE HCL 50 MG/ML IJ SOLN
12.5000 mg | Freq: Once | INTRAMUSCULAR | Status: AC
  Administered 2020-04-09: 09:00:00 12.5 mg via INTRAVENOUS
  Filled 2020-04-09: qty 1

## 2020-04-09 NOTE — Plan of Care (Signed)
  Problem: Education: Goal: Knowledge of General Education information will improve Description: Including pain rating scale, medication(s)/side effects and non-pharmacologic comfort measures Outcome: Progressing   Problem: Health Behavior/Discharge Planning: Goal: Ability to manage health-related needs will improve Outcome: Progressing   Problem: Clinical Measurements: Goal: Respiratory complications will improve Outcome: Progressing   Problem: Nutrition: Goal: Adequate nutrition will be maintained Outcome: Progressing   Problem: Pain Managment: Goal: General experience of comfort will improve Outcome: Progressing   Problem: Safety: Goal: Ability to remain free from injury will improve Outcome: Progressing   

## 2020-04-09 NOTE — Progress Notes (Signed)
Elink notified of AM critical lab values- wbc 55.3, Hgb 5.9, and plts 9

## 2020-04-09 NOTE — Progress Notes (Signed)
Patient is being transferred to room 1420.  Report has been given to receiving nurse and wife has been informed of patient's new location and room number.

## 2020-04-09 NOTE — Progress Notes (Signed)
NAME:  Carlos Vaughn, MRN:  [id number], DOB:  08-31-1945, LOS: 4 ADMISSION DATE:  04/05/2020, CONSULTATION DATE:  04/05/2020 REFERRING MD:  Dr. Sonny Dandy , CHIEF COMPLAINT:  Respiratory distress   Brief History:  74 yo male with GOLD II copd/ Group B last smoked about 4 week PTA presented with complaints of SOB who developed respiratory arrest requiring intubation. PCCM consult for admission.   Past Medical History:  COPD- FEV1 52% predicted Myelofibrosis Hyperlipidemia DVT Anxiety Hemorrhagic CVA  Significant Hospital Events:  Admitted for shortness of breath, suffered a respiratory arrest in ED 12/18> intubated.broad spec abx started. Systemic steroids started. Requested xfer to [facility].  12/19: Looked good on pressure support ventilation however decision made to hold off on extubation based on worsening chest x-ray 12/ 20 excellent ventilator mechanics, tolerating pressure support, IV Lasix administered.  Stopped vancomycin as PCR negative. Spoke w/ Dr Mariea Clonts. He has burnt out myelofibrosis. Pushes out blasts 5-10% usually sits 3-5%. Extubated successfully  12/21: Working towards discharge.  Transfer to [place]; but this was placed on hold as developed AF w/ RVR. hgb donw to 5.9; suspect this may have been playing a role. 12/22 getting blood for his drop in hgb. ECHO still pending. PLTs down to 9K, getting PLTs. Giving lasix between infusions. Working on weaning oxygen. Back in NSR; had been on ccb gtt. This is off. Holding NSR. Had visual change 12/21->getting MRI brain; still looking like could go home 12/23  Consults:  Oncology  Procedures:  ET tube 12/18 >12/20  Significant Diagnostic Tests:   Chest x-ray 12/18 > mild interstitial edema ECHO 12/22>>> MRI brain 12/22>>>  Micro Data:  COVID 12/18 >   negative Flu A&B 12/18  > negative Blood cultures 12/18> MRSA  12/19 neg  Trach aspirate 12/19>  Antimicrobials:  vanc 12/19>12/20 Cefepime 12/19> Azithromycin  12/19>stopped  Interim History / Subjective:  No distress  Objective   Blood pressure (Abnormal) 87/41, pulse 86, temperature (Abnormal) 97.5 F (36.4 C), resp. rate 18, height 5' 8" (1.727 m), weight 63.2 kg, SpO2 100 %.        Intake/Output Summary (Last 24 hours) at 04/09/2020 1042 Last data filed at 04/09/2020 0942 Gross per 24 hour  Intake 1017.57 ml  Output 2700 ml  Net -1682.43 ml   Filed Weights   04/07/20 0500 04/08/20 0500 04/09/20 0500  Weight: 63.5 kg 63.5 kg 63.2 kg    Examination: General this is a 74 year old male. He is resting in bed. Not in acute distress HENT NCAT no JVD pulm clear weaning O2 currently on 2 lpm Card RRR now back in NSR Ext scattered areas of ecchymosis abd soft  Neuro intact no visual change equal st today  gu cl yellow   Resolved Hospital Problem list     Assessment & Plan:    afib w/ RVR. This was transient and most likely due to anemia ->now back in NSR Plan Await ECHO Cont tele  Not candidate for [facility]  Transient visual change.  Did have AF w/ RVR. Was also anemic -if this was TIA or stroke little to offer given he is not a candidate for antiplatelet therapy Plan Will get MRI brain. I don't think there is any thing we can offer here but prognostically this would be helpful   Acute Hypoxic Respiratory Failure. Concern that a complication from myelofibrosis- acute cardiogenic pulmonary edema/TACO vs TRALI vs leukostasis.   Working dx is acute diastolic Heart failure Plan Wean oxygen  Cont  BDs Day 5 Cefepime IV lasix again today  Repeat am cxr am   Myelofibrosis- acute leukemoid reaction vs sepsis causing acute leukocytosis. -Per recent admission at [facility]; last bone marrow biopsy obtained 02/26/2020 consistent with myelofibrosis with no evidence of transformation to acute leukemia.  Patient is on Febratinib and Danazol.  -per my discussion with his primary oncologist he has quite advanced  myelofibrosis Plan F/u [facility]  Anemia - chronic hgb now down to 5.9 Plan Transfuse 2 units  Thrombocytopenia- chronic  -CBC change from [facility] 12/17 included WBC 38, hemoglobin 7.8, platelets 5.   -last PLT 12/19: 13K-->down to [facility] 12/22 -Oncology/hematology consulted-  Recommending transfer to [facility].  Plan Transfuse Am cbc  History of gold II COPD- FEV1 52% predicted. Still smoking 4 weeks prior to admission -Home medications were just stiolto and prn saba per wife  Plan Cont  Duoneb When home will go on Stiolto 2 puffs daily and PRN proair   Hypothyroidism Plan Cont synthroid        Best practice (evaluated daily)  Diet: Regular diet 12/21 Pain/Anxiety/Delirium protocol (if indicated): Discontinued 12/20 VAP protocol (if indicated): Discontinued 12/20 DVT prophylaxis:  PAS GI prophylaxis: PPI Glucose control: Monitor Mobility: Bedrest Disposition: ICU, moving to [place] 12/21  Goals of Care:  Last date of multidisciplinary goals of care discussion: wife at bedside am 12/19  Goals of care discussion had at bedside with wife.  Continues to be for aggressive care.  Hope is to get him home in the next 24 hours.  Wife updated as well as daughter both at bedside on 12/21 Follow up goals of care discussion due: Pending Code Status: Full  NA ccm time   Erick Colace ACNP-BC [place] Pager # [phone] OR # [phone] if no answer

## 2020-04-09 NOTE — Progress Notes (Signed)
  Echocardiogram 2D Echocardiogram has been performed.  Carlos Vaughn 04/09/2020, 2:43 PM

## 2020-04-09 NOTE — Progress Notes (Addendum)
eLink Physician-Brief Progress Note Patient Name: Carlos Vaughn DOB: 18-Mar-1946 MRN: [id number]   Date of Service  04/09/2020  HPI/Events of Note  Anemia  Thrombocytopenia  Leukocytosis   - Hgb = 5.9, Platelets = 9K and WBC = 71.6 --> 55.3. WBC count trending down. Patient has a history of Myelofibrosis and is followed by a Hematologist at [place] [facility]  Plan: 1. Transfuse 2 units PRBC now. 2. Transfuse 1 unit single donor platelets now.      Intervention Category Major Interventions: Other:  Lysle Dingwall 04/09/2020, 4:50 AM

## 2020-04-09 NOTE — Discharge Summary (Signed)
Physician Discharge Summary        Patient ID: Carlos Vaughn MRN: [id number] DOB/AGE: [birth year] 74 y.o.  Admit date: 04/05/2020 Discharge date: 04/10/2020  Discharge Diagnoses:    Paroxysmal afib w/ RVR.  Acute diastolic heart failure  Acute hypoxic respiratory failure Pulmonary edema Possible Transfusion related lung injury  Myelofibrosis  thrombocytopenia  Symptomatic anemia  H/o GOLD II COPD H/o tobacco abuse  Hypothyroidism  Anxiety    Discharge summary    74 yo male with GOLD II copd/ Group B last smoked about 4 week PTA presented with complaints of SOB who developed respiratory arrest requiring intubation. PCCM consult for admission. Admitted for shortness of breath, suffered a respiratory arrest in ED 12/18> intubated.broad spec abx started. Systemic steroids started. Requested xfer to [facility].  12/19: Looked good on pressure support ventilation however decision made to hold off on extubation based on worsening chest x-ray 12/ 20 excellent ventilator mechanics, tolerating pressure support, IV Lasix administered.  Stopped vancomycin as PCR negative. Spoke w/ Dr Mariea Clonts. He has burnt out myelofibrosis. Pushes out blasts 5-10% usually sits 3-5%. Extubated successfully  12/21: Working towards discharge.  Transfer to [place]; but this was placed on hold as developed AF w/ RVR. hgb donw to 5.9; suspect this may have been playing a role. 12/22 getting blood for his drop in hgb. ECHO ordered and showed normal LV fxn 20-94%, grade I diastolic dysfxn, no RV dysfxn. PLTs down to 9K, getting PLTs. Got lasix between infusions. Working on weaning oxygen. Room air pulse ox: mid 90s, pulse oximetry at night was witnessed to drop below 85% per discussion w/ bedside RN on 12/22 to 12/23.  Back in NSR; had been on ccb gtt, off in morning.  Holding NSR. Had visual change 12/21.  MRI done for transient complaint of visual changes. This showed:no acute abnormalities. Antibiotics stopped as of  12/23. Had 6 days of cefepime for possible pneumonia.  F/u am labs 12/23: did show his platelet count to still be 9K so he was again transfused 2 units.  Deemed ready for dc as of 12/23 w/ plan as outlined below.   Discharge Plan by Active Problems    afib w/ RVR. This was transient and most likely due to anemia ->now back in NSR Plan Not candidate for [facility] Would avoid anemia    Acute Hypoxic Respiratory Failure. Concern that a complication from myelofibrosis- acute cardiogenic pulmonary edema/TACO vs TRALI vs leukostasis.  Working dx is acute diastolic Heart failure Plan Would consider active diuresis on days of transfusion  F/u cxr out pt   Witnessed Nocturnal hypoxia.  Nursing reports witness episodes of apnea during sleep. Has not had PSG but certainly could have central sleep apnea.  Wife reports exertional dyspnea but I would be more inclined to think this was 2/2 anemia  Plan Nocturnal oxygen at 2 liters. OK to use PRN if helps    Myelofibrosis- acute leukemoid reaction vs sepsis causing acute leukocytosis. -Per recent admission at [facility]; last bone marrow biopsy obtained 02/26/2020 consistent with myelofibrosis with no evidence of transformation to acute leukemia.  Patient is on Febratinib and Danazol.  -per my discussion with his primary oncologist he has quite advanced myelofibrosis Plan F/u [facility]  Anemia - chronic Got 2 units blood last on 12/22 for hgb down to 5.9 hgb as of 12/23:7.7 Thrombocytopenia- chronic Platelets  9K 12/22, transfused 1 unit plt on 12/23 9 K so got 2 more packs of platelets Plan F/u  [facility] 12/28 (already scheduled)   History of gold II COPD- FEV1 52% predicted. Still smoking 4 weeks prior to admission -Home medications were just stiolto and prn saba per wife  Plan Cont Stiolto 2 puffs daily and PRN proair   Hypothyroidism Plan [facility] tests/ studies  Chest x-ray 12/18 > mild  interstitial edema ECHO 12/22>>> MRI brain 12/22>>>normal   Procedures   ET tube 12/18 >12/20 Culture data/antimicrobials    Micro Data:  COVID 12/18 >   negative Flu A&B 12/18  > negative Blood cultures 12/18> MRSA  12/19 neg  Trach aspirate 12/19>neg  Antimicrobials:  vanc 12/19>12/20 Cefepime 12/19>12/23 Azithromycin 12/19>stopped     Consults   Heme/onc   Discharge Exam: Blood Pressure 113/69 (BP Location: Left Arm)   Pulse 74   Temperature 98.5 F (36.9 C) (Oral)   Respiration 18   Height '5\' 8"'  (1.727 m)   Weight 64.4 kg   Oxygen Saturation 94%   Body Mass Index 21.59 kg/m   General very pleasant 74 year old white male sitting up right in chair no distress HENT NCAT no JVD pulm cl faint basilar rales Card RRR no MRG abd soft Ext warm and dry scattered areas of ecchymosis Neuro intact gu voids  Labs at discharge   Lab Results  Component Value Date   CREATININE 0.80 04/10/2020   BUN 51 (H) 04/10/2020   NA 139 04/10/2020   K 3.3 (L) 04/10/2020   CL 104 04/10/2020   CO2 28 04/10/2020   Lab Results  Component Value Date   WBC 40.4 (H) 04/10/2020   HGB 7.7 (L) 04/10/2020   HCT 25.1 (L) 04/10/2020   MCV 98.8 04/10/2020   PLT 9 (LL) 04/10/2020   Lab Results  Component Value Date   ALT 127 (H) 04/10/2020   AST 56 (H) 04/10/2020   ALKPHOS 196 (H) 04/10/2020   BILITOT 1.8 (H) 04/10/2020   No results found for: INR, PROTIME  Current radiological studies    MR BRAIN WO CONTRAST  Result Date: 04/09/2020 CLINICAL DATA:  Dizziness, non-specific EXAM: MRI HEAD WITHOUT CONTRAST TECHNIQUE: Multiplanar, multiecho pulse sequences of the brain and surrounding structures were obtained without intravenous contrast. COMPARISON:  06/27/2017 and prior FINDINGS: Please note examination was terminated early secondary to patient disposition. Limited sequence acquisition and motion artifact limits evaluation. Brain: No diffusion-weighted signal abnormality.  Chronic hemosiderin deposition along the falx and tentorial leaflets. No midline shift, ventriculomegaly or extra-axial fluid collection. No mass lesion. Mild cerebral atrophy with ex vacuo dilatation. Mild chronic microvascular ischemic changes. Vascular: Normal flow voids. Skull and upper cervical spine: Normal marrow signal. Sinuses/Orbits: Normal orbits. Minimal right sphenoid sinus mucosal thickening. Right mastoid effusion. Other: None. IMPRESSION: No acute intracranial process. Mild cerebral atrophy and chronic microvascular ischemic changes. Electronically Signed   By: Primitivo Gauze M.D.   On: 04/09/2020 15:26   ECHOCARDIOGRAM COMPLETE  Result Date: 04/09/2020    ECHOCARDIOGRAM REPORT   Patient Name:   JAIEL SARACENO Date of Exam: 04/09/2020 Medical Rec #:  [id number]         Height:       68.0 in Accession #:    [id number]        Weight:       139.3 lb Date of Birth:  November 10, 1945         BSA:          1.753 m Patient Age:    51 years  BP:           128/51 mmHg Patient Gender: M                 HR:           77 bpm. Exam Location:  Inpatient Procedure: 2D Echo, Cardiac Doppler and Color Doppler Indications:    Atrial fibrillation  History:        Patient has prior history of Echocardiogram examinations, most                 recent 03/16/2019. COPD, Stroke and DVT,                 Signs/Symptoms:Shortness of Breath; Risk Factors:Current Smoker.                 GOLD COPD, resp. arrest, myleofibrosis, anemia.  Sonographer:    Dustin Flock Referring Phys: [id number] MICHAEL B WERT  Sonographer Comments: Image acquisition challenging due to COPD and Image acquisition challenging due to respiratory motion. IMPRESSIONS  1. Left ventricular ejection fraction, by estimation, is 60 to 65%. The left ventricle has normal function. Left ventricular endocardial border not optimally defined to evaluate regional wall motion. Left ventricular diastolic parameters are consistent with Grade I diastolic  dysfunction (impaired relaxation).  2. Right ventricular systolic function is normal. The right ventricular size is normal. Tricuspid regurgitation signal is inadequate for assessing PA pressure.  3. Left atrial size was mildly dilated.  4. The mitral valve is degenerative. No evidence of mitral valve regurgitation. Mild mitral stenosis. The mean mitral valve gradient is 4.3 mmHg with average heart rate of 78 bpm. Moderate mitral annular calcification.  5. The aortic valve is tricuspid. There is mild calcification of the aortic valve. There is mild thickening of the aortic valve. Aortic valve regurgitation is not visualized. Mild to moderate aortic valve sclerosis/calcification is present, without any evidence of aortic stenosis.  6. The inferior vena cava is normal in size with greater than 50% respiratory variability, suggesting right atrial pressure of 3 mmHg. Comparison(s): No significant change from prior study. FINDINGS  Left Ventricle: Left ventricular ejection fraction, by estimation, is 60 to 65%. The left ventricle has normal function. Left ventricular endocardial border not optimally defined to evaluate regional wall motion. The left ventricular internal cavity size was normal in size. There is no left ventricular hypertrophy. Left ventricular diastolic parameters are consistent with Grade I diastolic dysfunction (impaired relaxation). Right Ventricle: The right ventricular size is normal. No increase in right ventricular wall thickness. Right ventricular systolic function is normal. Tricuspid regurgitation signal is inadequate for assessing PA pressure. Left Atrium: Left atrial size was mildly dilated. Right Atrium: Right atrial size was not well visualized. Pericardium: Trivial pericardial effusion is present. Mitral Valve: The mitral valve is degenerative in appearance. Moderate mitral annular calcification. No evidence of mitral valve regurgitation. Mild mitral valve stenosis. The mean mitral valve  gradient is 4.3 mmHg with average heart rate of 78 bpm. Tricuspid Valve: The tricuspid valve is grossly normal. Tricuspid valve regurgitation is trivial. No evidence of tricuspid stenosis. Aortic Valve: The aortic valve is tricuspid. There is mild calcification of the aortic valve. There is mild thickening of the aortic valve. Aortic valve regurgitation is not visualized. Mild to moderate aortic valve sclerosis/calcification is present, without any evidence of aortic stenosis. Pulmonic Valve: The pulmonic valve was grossly normal. Pulmonic valve regurgitation is not visualized. No evidence of pulmonic stenosis. Aorta: The aortic root is normal in  size and structure. Venous: The inferior vena cava is normal in size with greater than 50% respiratory variability, suggesting right atrial pressure of 3 mmHg. IAS/Shunts: The interatrial septum was not well visualized.  LEFT VENTRICLE PLAX 2D LVIDd:         4.60 cm  Diastology LVIDs:         3.90 cm  LV e' medial:    10.70 cm/s LV PW:         1.10 cm  LV E/e' medial:  8.9 LV IVS:        1.10 cm  LV e' lateral:   17.20 cm/s LVOT diam:     2.40 cm  LV E/e' lateral: 5.5 LV SV:         119 LV SV Index:   68 LVOT Area:     4.52 cm  RIGHT VENTRICLE RV Basal diam:  2.90 cm RV S prime:     19.00 cm/s TAPSE (M-mode): 1.9 cm LEFT ATRIUM             Index       RIGHT ATRIUM           Index LA diam:        4.80 cm 2.74 cm/m  RA Area:     19.30 cm LA Vol (A2C):   39.6 ml 22.59 ml/m RA Volume:   52.00 ml  29.67 ml/m LA Vol (A4C):   62.3 ml 35.55 ml/m LA Biplane Vol: 49.8 ml 28.41 ml/m  AORTIC VALVE LVOT Vmax:   127.00 cm/s LVOT Vmean:  72.000 cm/s LVOT VTI:    0.263 m  AORTA Ao Root diam: 3.10 cm MITRAL VALVE MV Area (PHT): 2.62 cm     SHUNTS MV Area VTI:   2.74 cm     Systemic VTI:  0.26 m MV Mean grad:  4.3 mmHg     Systemic Diam: 2.40 cm MV VTI:        0.44 m MV Decel Time: 289 msec MV E velocity: 95.20 cm/s MV A velocity: 127.00 cm/s MV E/A ratio:  0.75 Eleonore Chiquito MD  Electronically signed by Eleonore Chiquito MD Signature Date/Time: 04/09/2020/3:34:37 PM    Final     Disposition:    Discharge disposition: 01-Home or Self Care       Discharge Instructions    Diet - low sodium heart healthy   Complete by: As directed    Diet general   Complete by: As directed    Discharge instructions   Complete by: As directed    Ok to try oxygen at 2 liters w/ exertion as well if needed   For home use only DME oxygen   Complete by: As directed    Desaturation witnessed to < 85% while sleeping.   Length of Need: 12 Months   Mode or (Route): Nasal cannula   Liters per Minute: 2   Frequency: Only at night (stationary unit needed)   Oxygen conserving device: No   Oxygen delivery system: Gas   Increase activity slowly   Complete by: As directed    No wound care   Complete by: As directed       Allergies as of 04/10/2020    Allergen Reactions Comments   Oxycodone Itching       Medication List    Stop taking these medications   danazol 200 MG capsule Commonly known as: DANOCRINE   Fedratinib HCl 100 MG Caps     Take these medications  albuterol 108 (90 Base) MCG/ACT inhaler Commonly known as: VENTOLIN HFA Inhale 1-2 puffs into the lungs every 6 (six) hours as needed for wheezing or shortness of breath. What changed: when to take this   albuterol (2.5 MG/3ML) 0.083% nebulizer solution Commonly known as: PROVENTIL Take 3 mLs (2.5 mg total) by nebulization every 6 (six) hours as needed for wheezing or shortness of breath. What changed: Another medication with the same name was changed. Make sure you understand how and when to take each.   allopurinol 300 MG tablet Commonly known as: ZYLOPRIM Take 300 mg by mouth daily.   CALCIUM-MAGNESIUM-ZINC PO Take 1 tablet by mouth daily.   furosemide 40 MG tablet Commonly known as: LASIX Take 40 mg by mouth daily as needed for fluid or edema.   levothyroxine 75 MCG tablet Commonly known as:  SYNTHROID Take 75 mcg by mouth daily before breakfast.   multivitamin tablet Take 1 tablet by mouth daily.   Potassium 99 MG Tabs Take 1 tablet by mouth daily.   prazosin 2 MG capsule Commonly known as: MINIPRESS Take 2 mg by mouth at bedtime.   Stiolto Respimat 2.5-2.5 MCG/ACT Aers Generic drug: Tiotropium Bromide-Olodaterol Inhale 2 puffs into the lungs daily.   thiamine 100 MG tablet Take 100 mg by mouth daily.   traMADol 50 MG tablet Commonly known as: ULTRAM Take 50 mg by mouth at bedtime.   traZODone 150 MG tablet Commonly known as: DESYREL Take 150 mg by mouth at bedtime.   Vitamin D3 25 MCG (1000 UT) Chew Chew 1,000 Units by mouth daily.        Durable Medical Equipment  (From admission, onward)         Start     Ordered   04/10/20 0000  For home use only DME oxygen       Comments: Desaturation witnessed to < 85% while sleeping.  Question Answer Comment  Length of Need 12 Months   Mode or (Route) Nasal cannula   Liters per Minute 2   Frequency Only at night (stationary unit needed)   Oxygen conserving device No   Oxygen delivery system Gas      04/10/20 1402           Follow-up appointment   12/28 with hematology  Our office will contact  Discharge Condition:    good  Physician Statement:   The Patient was personally examined, the discharge assessment and plan has been personally reviewed and I agree with ACNP Rilen Shukla's assessment and plan. 34  minutes of time have been dedicated to discharge assessment, planning and discharge instructions.   Signed: Clementeen Graham 04/10/2020, 2:02 PM

## 2020-04-10 DIAGNOSIS — R0602 Shortness of breath: Secondary | ICD-10-CM

## 2020-04-10 DIAGNOSIS — D7581 Myelofibrosis: Secondary | ICD-10-CM

## 2020-04-10 LAB — PREPARE PLATELET PHERESIS: Unit division: 0

## 2020-04-10 LAB — TYPE AND SCREEN
ABO/RH(D): O NEG
Antibody Screen: NEGATIVE
Unit division: 0
Unit division: 0

## 2020-04-10 LAB — COMPREHENSIVE METABOLIC PANEL
ALT: 127 U/L — ABNORMAL HIGH (ref 0–44)
AST: 56 U/L — ABNORMAL HIGH (ref 15–41)
Albumin: 2.8 g/dL — ABNORMAL LOW (ref 3.5–5.0)
Alkaline Phosphatase: 196 U/L — ABNORMAL HIGH (ref 38–126)
Anion gap: 7 (ref 5–15)
BUN: 51 mg/dL — ABNORMAL HIGH (ref 8–23)
CO2: 28 mmol/L (ref 22–32)
Calcium: 8.6 mg/dL — ABNORMAL LOW (ref 8.9–10.3)
Chloride: 104 mmol/L (ref 98–111)
Creatinine, Ser: 0.8 mg/dL (ref 0.61–1.24)
GFR, Estimated: >60 mL/min (ref >60)
Glucose, Bld: 85 mg/dL (ref 70–99)
Potassium: 3.3 mmol/L — ABNORMAL LOW (ref 3.5–5.1)
Sodium: 139 mmol/L (ref 135–145)
Total Bilirubin: 1.8 mg/dL — ABNORMAL HIGH (ref 0.3–1.2)
Total Protein: 5.1 g/dL — ABNORMAL LOW (ref 6.5–8.1)

## 2020-04-10 LAB — CBC
HCT: 25.1 % — ABNORMAL LOW (ref 39.0–52.0)
Hemoglobin: 7.7 g/dL — ABNORMAL LOW (ref 13.0–17.0)
MCH: 30.3 pg (ref 26.0–34.0)
MCHC: 30.7 g/dL (ref 30.0–36.0)
MCV: 98.8 fL (ref 80.0–100.0)
Platelets: 9 10*3/uL — CL (ref 150–400)
RBC: 2.54 MIL/uL — ABNORMAL LOW (ref 4.22–5.81)
RDW: 20.8 % — ABNORMAL HIGH (ref 11.5–15.5)
WBC: 40.4 10*3/uL — ABNORMAL HIGH (ref 4.0–10.5)
nRBC: 41.6 % — ABNORMAL HIGH (ref 0.0–0.2)

## 2020-04-10 LAB — BPAM PLATELET PHERESIS
Blood Product Expiration Date: 202112232359
ISSUE DATE / TIME: 202112222002
Unit Type and Rh: 5100

## 2020-04-10 LAB — BPAM RBC
Blood Product Expiration Date: 202201192359
Blood Product Expiration Date: 202201192359
ISSUE DATE / TIME: 202112220905
ISSUE DATE / TIME: 202112221544
Unit Type and Rh: 9500
Unit Type and Rh: 9500

## 2020-04-10 LAB — GLUCOSE, CAPILLARY
Glucose-Capillary: 93 mg/dL (ref 70–99)
Glucose-Capillary: 99 mg/dL (ref 70–99)

## 2020-04-10 MED ORDER — FUROSEMIDE 10 MG/ML IJ SOLN
40.0000 mg | Freq: Once | INTRAMUSCULAR | Status: AC
  Administered 2020-04-10: 12:00:00 40 mg via INTRAVENOUS
  Filled 2020-04-10: qty 4

## 2020-04-10 MED ORDER — POTASSIUM CHLORIDE CRYS ER 20 MEQ PO TBCR
40.0000 meq | EXTENDED_RELEASE_TABLET | Freq: Once | ORAL | Status: AC
  Administered 2020-04-10: 12:00:00 40 meq via ORAL
  Filled 2020-04-10: qty 2

## 2020-04-10 NOTE — TOC Transition Note (Signed)
Transition of Care [facility]) - CM/SW Discharge Note   Patient Details  Name: Carlos Vaughn MRN: [id number] Date of Birth: 1945-04-21  [facility] CM/SW Contact:  Ross Ludwig, LCSW Phone Number: 04/10/2020, 3:16 PM   Clinical Narrative:     CSW was informed that patient will need oxygen to go home.  CSW spoke to patient's wife, and informed her that [employer] will be able to supply the oxygen.  CSW contacted [employer] and spoke to [place], and he can use patient's [employer] Medicare benefits to get oxygen ordered for patient.  CSW requested that bedside nurse put in qualification note in so insurance can verify that he needs oxygen.  CSW to continue to facilitate discharge planning.   Final next level of care: Home/Self Care Barriers to Discharge: Equipment Delay   Patient Goals and CMS Choice Patient states their goals for this hospitalization and ongoing recovery are:: To return back home with his wife. CMS Medicare.gov Compare Post Acute Care list provided to:: Patient Represenative (must comment) Choice offered to / list presented to : Spouse  Discharge Placement                       Discharge Plan and Services   Discharge Planning Services: CM Consult              DME Agency: Other - Comment [facility]) Date DME Agency Contacted: 04/10/20 Time DME Agency Contacted: 61 Representative spoke with at DME Agency: [facility]     Readmission Risk Interventions No flowsheet data found.

## 2020-04-10 NOTE — Progress Notes (Signed)
Patient discharge via wheelchair accompanied by staff and wife. Patient is alert and oriented, not on any distress.All belongings are with the family. Pt. recived 2 "u" of Platelets Vital sign stable upon discharge. EVS instruction and education given to patient and wife, both verbalized understanding.

## 2020-04-10 NOTE — Progress Notes (Signed)
SATURATION QUALIFICATIONS: (This note is used to comply with regulatory documentation for home oxygen)  Patient Saturations on Room Air at Rest = 90- 92% Patient Saturations on Room Air while Ambulating = 81-85%  Patient Saturations on 2 Liters of oxygen while Ambulating = 88-%  Pt. Unable to tolerate walking without oxygen.

## 2020-04-10 NOTE — TOC Progression Note (Signed)
Transition of Care [facility]) - Progression Note    Patient Details  Name: Carlos Vaughn MRN: [id number] Date of Birth: 1946/02/23  [facility] CM/SW Contact  Purcell Mouton, RN Phone Number: 04/10/2020, 12:22 PM  Clinical Narrative:     [facility]'s information:PCP Dr. Jaci Carrel, [place] pager, [phone] ext [phone] pager.   Expected Discharge Plan: Home/Self Care Barriers to Discharge: No Barriers Identified  Expected Discharge Plan and Services Expected Discharge Plan: Home/Self Care   Discharge Planning Services: CM Consult   Living arrangements for the past 2 months: Single Family Home                                       Social Determinants of Health (SDOH) Interventions    Readmission Risk Interventions No flowsheet data found.

## 2020-04-11 LAB — PREPARE PLATELET PHERESIS
Unit division: 0
Unit division: 0

## 2020-04-11 LAB — BPAM PLATELET PHERESIS
Blood Product Expiration Date: 202112242359
Blood Product Expiration Date: 202112242359
ISSUE DATE / TIME: 202112231244
ISSUE DATE / TIME: 202112231528
Unit Type and Rh: 5100
Unit Type and Rh: 5100

## 2020-04-11 LAB — CULTURE, BLOOD (ROUTINE X 2)
Culture: NO GROWTH
Culture: NO GROWTH
Special Requests: ADEQUATE
Special Requests: ADEQUATE

## 2020-04-14 ENCOUNTER — Telehealth: Payer: Self-pay

## 2020-04-14 NOTE — Telephone Encounter (Signed)
-----   Message from Simonne Martinet, NP sent at 04/10/2020  2:03 PM EST ----- Needs follow up with [facility] or APP next couple of weeks for post-hospital follow up Wife will need to be notified.   pete

## 2020-04-14 NOTE — Telephone Encounter (Signed)
Called and spoke with patient's wife [name]. [name] was driving when I called and requested that I call back on Wednesday (patient will be at [facility] all day tomorrow) to get him scheduled. Advised her that I would call back on Wednesday as requested.   Will keep in my inbox for follow up.

## 2020-04-20 ENCOUNTER — Telehealth: Payer: Self-pay | Admitting: Pulmonary Disease

## 2020-04-20 NOTE — Telephone Encounter (Signed)
Patient spouse called answering service  More short of breath today  Has been on oxygen and oxygen levels 85-87, had to bump him up to 3 and half liters Also using nebulizer treatments  Skipped Lasix today because it was going frequently yesterday when he had his Lasix   Encouraged to use his Lasix today as he has significant swelling of his lower extremity Continue with nebulization treatments up to 4 times a day Continue oxygen supplementation  Encouraged to bring him to the hospital if worsening  Call the office tomorrow if not feeling better

## 2020-04-28 ENCOUNTER — Encounter: Payer: Self-pay | Admitting: Pulmonary Disease

## 2020-04-28 ENCOUNTER — Ambulatory Visit (INDEPENDENT_AMBULATORY_CARE_PROVIDER_SITE_OTHER): Payer: No Typology Code available for payment source | Admitting: Pulmonary Disease

## 2020-04-28 ENCOUNTER — Other Ambulatory Visit: Payer: Self-pay

## 2020-04-28 VITALS — BP 108/60 | HR 79 | Temp 98.4°F | Ht 68.0 in | Wt 142.2 lb

## 2020-04-28 DIAGNOSIS — Z87891 Personal history of nicotine dependence: Secondary | ICD-10-CM | POA: Diagnosis not present

## 2020-04-28 DIAGNOSIS — D7581 Myelofibrosis: Secondary | ICD-10-CM

## 2020-04-28 DIAGNOSIS — J9611 Chronic respiratory failure with hypoxia: Secondary | ICD-10-CM

## 2020-04-28 DIAGNOSIS — J449 Chronic obstructive pulmonary disease, unspecified: Secondary | ICD-10-CM

## 2020-04-28 NOTE — Assessment & Plan Note (Signed)
Plan: Walk today in office, no exertional hypoxemia Continue 2 L of O2 at night Okay to use oxygen during the day on as-needed basis Maintain oxygen saturations above 88%

## 2020-04-28 NOTE — Progress Notes (Signed)
'@Patient'  ID: Carlos Vaughn, adult    DOB: Feb 14, 1946, 75 y.o.   MRN: [id number]  Chief Complaint  Patient presents with  . Follow-up    C/o sob, occass. Wheezing, productive cough not seen,ankle edema.    Referring provider: Caralyn Guile, DO  HPI:  75 year old  current everyday smoker followed in our office for COPD  PMH: History of VTE events, history of acute DVT, PTSD, history of seizures, epilepsy Smoker/ Smoking History: Former Smoker. 12/2019.  Maintenance:  Stiolto Pt of: Dr. Melvyn Novas  04/28/2020  - Visit   75 year old  current everyday smoker followed in our office for COPD.  He was last seen in our office by Dr. Melvyn Novas in September/2021.  Since last being seen patient was hospitalized in December/2021.  He was discharged on 04/10/2020.  Discharge summary listed below:   Admit date: 04/05/2020 Discharge date: 04/10/2020  Discharge Diagnoses:    Paroxysmal afib w/ RVR.  Acute diastolic heart failure  Acute hypoxic respiratory failure Pulmonary edema Possible Transfusion related lung injury  Myelofibrosis  thrombocytopenia  Symptomatic anemia  H/o GOLD II COPD H/o tobacco abuse  Hypothyroidism  Anxiety    Discharge summary    75 yo male with GOLD II copd/ Group B last smoked about 4 week PTA presented with complaints of SOB who developed respiratory arrest requiring intubation. PCCM consult for admission. Admitted for shortness of breath, suffered a respiratory arrest in ED 12/18>intubated.broad spec abx started. Systemic steroids started. Requested xfer to [facility].  12/19: Looked good on pressure support ventilation however decision made to hold off on extubation based on worsening chest x-ray 12/ 20 excellent ventilator mechanics, tolerating pressure support, IV Lasix administered. Stopped vancomycin as PCR negative. Spoke w/ Dr Mariea Clonts. He has burnt out myelofibrosis. Pushes out blasts 5-10% usually sits 3-5%. Extubated successfully  12/21: Working  towards discharge. Transfer to [place]; but this was placed on hold as developed AF w/ RVR. hgb donw to 5.9; suspect this may have been playing a role. 12/22 getting blood for his drop in hgb. ECHO ordered and showed normal LV fxn 93-71%, grade I diastolic dysfxn, no RV dysfxn. PLTs down to 9K, getting PLTs. Got lasix between infusions. Working on weaning oxygen. Room air pulse ox: mid 90s, pulse oximetry at night was witnessed to drop below 85% per discussion w/ bedside RN on 12/22 to 12/23.  Back in NSR; had been on ccb gtt, off in morning.  Holding NSR. Had visual change 12/21.  MRI done for transient complaint of visual changes. This showed:no acute abnormalities. Antibiotics stopped as of 12/23. Had 6 days of cefepime for possible pneumonia.  F/u am labs 12/23: did show his platelet count to still be 9K so he was again transfused 2 units.  Deemed ready for dc as of 12/23 w/ plan as outlined below.   Discharge Plan by Active Problems    afib w/ RVR. This was transient and most likely due to anemia ->now back in NSR Plan Not candidate for [facility] Would avoid anemia    Acute Hypoxic Respiratory Failure. Concern that a complication from myelofibrosis- acute cardiogenic pulmonary edema/TACO vs TRALI vs leukostasis.  Working dx is acute diastolic Heart failure Plan Would consider active diuresis on days of transfusion  F/u cxr out pt   Witnessed Nocturnal hypoxia.  Nursing reports witness episodes of apnea during sleep. Has not had PSG but certainly could have central sleep apnea.  Wife reports exertional dyspnea but I would be more inclined to  think this was 2/2 anemia  Plan Nocturnal oxygen at 2 liters. OK to use PRN if helps    Myelofibrosis- acute leukemoid reaction vs sepsis causing acute leukocytosis. -Per recent admission at [facility]; last bone marrow biopsy obtained 02/26/2020 consistent with myelofibrosis with no evidence of transformation to acute leukemia.  Patient is on Febratinib and Danazol.  -per my discussion with his primary oncologist he has quite advanced myelofibrosis Plan F/u [facility]  Anemia - chronic Got 2 units blood last on 12/22 for hgb down to 5.9 hgb as of 12/23:7.7 Thrombocytopenia- chronic Platelets  9K 12/22, transfused 1 unit plt on 12/23 9 K so got 2 more packs of platelets Plan F/u heme 12/28 (already scheduled)   History of gold II COPD- FEV1 52% predicted. Still smoking 4 weeks prior to admission -Home medications were just stiolto and prn saba per wife  Plan Cont Stiolto 2 puffs daily and PRN proair   Hypothyroidism Plan Cont synthroid   Since being discharged patient has completed follow-up with hematology.  He is managed at [facility].  Dr. Mariea Clonts.  Last seen on 04/25/2020.  Assessment and plan from that office visit was as follows:   Assessment and Plan:  Carlos Vaughn is a 75 y.o. male with pmhx of COPD, HLD, RLE DVT, hemorrhagic CVA 2018, and myelofibrosis who presents for follow-up.   1. Primary myelofibrosis - [facility] in [facility] Previously on Jakafi and Danazol - bone marrow biopsy 09/18/2019; no evidence of accelerated phase, consistent with persistent myelofibrosis with <1% blasts by CD34 IHC - bone marrow biopsy 02/26/2020 significant for primary myelofibrosis without evidence of increased blasts (2%) and peripheral blast count at 5 %. NGS pending. Cytogenetics normal. 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 - Started fedratinib at 100 mg daily 10/31/2019 - Increased fedratinib to 400 mg daily 03/04/2020 -  Decreased fedratinib from 400 mg to 300 mg 03/18/2020 - Fedratinib 300 mg daily discontinued 12/8 for worsening anemia and thrombocytopenia - Restarting fedratinib at 100 mg daily on 1/7/20222  - Patient is symptomatic with ongoing fatigue, dyspnea with exertion and malaise - I reviewed with the patient his blood counts today WBC 74.3 K; ANC: 28.3 K; 2% blasts; Hb 5.6 g/dL; PLT 13 K  - I reviewed with the patient and his wife the diagnosis and prognosis of myelofibrosis  - He continues to show no evidence of accelerated phase or leukemia which is consistent with most recent bone marrow biopsy evaluation 02/26/2020 - I want him to restart his fedratinib today at 100 mg. We will titrate this up to 300 mg but will likely not attempt to re-challenge at 400 mg. It is difficult to know whether or not fedratinib ultimately was causing his significant myelosupression on 400 mg or if he was having worsening bone marrow failure in setting of infection etc - we will continue to titrate fedratinib; we will continue to hold danazol as this did not appear to improve his transfusion requirements; he will continue on his thiamine. We will continue to monitor twice weekly blood  count checks and will plan to see him 1/18 - We have reviewed that given his co-morbidities and age he is not a candidate for stem cell transplant  - We have reserved pacritinib for potential compassionate use in the future if we can secure it from drug company in the future  2. HEME: anemia and thrombocytopenia - due to underlying MF  - Keep Hb >8 and PLT >20 K  - will transfuse 1 unit of PRBC today - Coombs negative hemolysis work up inpatient unrevealing - Premeds are indicated due to prior blood transfusion reactions - he will receive 2 units of blood today and will plan for 1 unit of platelets and we will treat with IV lasix for TACO prophylaxis  3. Hx of recurrent RLE DVT - Previously on Eliquis but this was discontinued during  recent admission due to low plts - bilateral lower extremity edema w/ R > L is chronic; continue lasix - Continue to hold Eliquis  4. Acute hypoxic respiratory failure - resolved, on room air now; although patient requires home oxygen for comfort on demand occasionally  - prior hospitalizations likely related to COPD, possible pneumonia, volume overload +/- TACO or TRALI  - will treat with IV lasix for transfusions in the future   5. Hyperbilirubinemia - stable, indirect in the past consistent with gilberts  - continue to monitor  RTC: 05/06/2020  Patient presented to office today reporting that he has had slight increase in cough and some occasional wheezing.  He does not have to use his rescue inhaler very often maybe 1 times daily.  He reports adherence to [employer] Respimat.  He has had episodes at home where he is felt more short of breath and he was instructed by hematology to turn oxygen up.  He utilized 3-1/2 L for symptom management of his O2.  Walk today in office patient completed 1 lap on room air and without any hypoxemia.  Questionaires / Pulmonary Flowsheets:   ACT:  No flowsheet data found.  MMRC: mMRC Dyspnea Scale mMRC Score  08/14/2019 2    Epworth:  No flowsheet data found.  Tests:     FENO:  No results found for: NITRICOXIDE  PFT: PFT Results Latest Ref Rng & Units 10/29/2019  FVC-Pre L 2.57  FVC-Predicted Pre % 66  FVC-Post L 2.78  FVC-Predicted Post % 72  Pre FEV1/FVC % % 57  Post FEV1/FCV % % 58  FEV1-Pre L 1.47  FEV1-Predicted Pre % 52  FEV1-Post L 1.60  DLCO uncorrected ml/min/mmHg 10.70  DLCO UNC% % 45  DLCO corrected ml/min/mmHg 10.70  DLCO COR %Predicted % 45  DLVA Predicted % 59    WALK:  SIX MIN WALK 04/28/2020 08/30/2019 06/21/2019  Supplimental Oxygen during Test? (L/min) No No No  Tech Comments: Attempted to walk to qualify for POC device, Walked 1 lap at a slow pace with walker lowest oxygen sat. was 93% on room air. did not  complete lap 3 at pt request d/t dyspnea.  pt walked a moderate pace. average pace/no SOB//lmr    Imaging: DG Chest 1 View  Result Date: 04/06/2020 CLINICAL DATA:  Acute respiratory failure with hypoxia. Endotracheally intubated. Nasogastric tube placement. EXAM: CHEST  1 VIEW COMPARISON:  04/05/2020 FINDINGS: Endotracheal tube remains in satisfactory position. Nasogastric tube is again seen coursing into the stomach, although the distal tip is not visualized on this exam. Patient is rotated to the right. Heart size is stable allowing for differences in patient positioning. Diffuse  interstitial infiltrates are again seen with mild worsening of airspace disease in both lung bases. IMPRESSION: Endotracheal tube in satisfactory position. Nasogastric tube courses into the stomach, although the distal tip is not visualized on this exam. Diffuse interstitial infiltrates/edema, with worsening airspace disease in both lung bases. Electronically Signed   By: Marlaine Hind M.D.   On: 04/06/2020 06:31   MR BRAIN WO CONTRAST  Result Date: 04/09/2020 CLINICAL DATA:  Dizziness, non-specific EXAM: MRI HEAD WITHOUT CONTRAST TECHNIQUE: Multiplanar, multiecho pulse sequences of the brain and surrounding structures were obtained without intravenous contrast. COMPARISON:  06/27/2017 and prior FINDINGS: Please note examination was terminated early secondary to patient disposition. Limited sequence acquisition and motion artifact limits evaluation. Brain: No diffusion-weighted signal abnormality. Chronic hemosiderin deposition along the falx and tentorial leaflets. No midline shift, ventriculomegaly or extra-axial fluid collection. No mass lesion. Mild cerebral atrophy with ex vacuo dilatation. Mild chronic microvascular ischemic changes. Vascular: Normal flow voids. Skull and upper cervical spine: Normal marrow signal. Sinuses/Orbits: Normal orbits. Minimal right sphenoid sinus mucosal thickening. Right mastoid effusion.  Other: None. IMPRESSION: No acute intracranial process. Mild cerebral atrophy and chronic microvascular ischemic changes. Electronically Signed   By: Primitivo Gauze M.D.   On: 04/09/2020 15:26   DG Chest Port 1 View  Result Date: 04/08/2020 CLINICAL DATA:  ARDS. EXAM: PORTABLE CHEST 1 VIEW COMPARISON:  04/07/2020. FINDINGS: Interim extubation and removal of NG tube. Stable cardiomegaly. Diffuse bilateral interstitial prominence again noted without interim change. Persistent right base atelectasis. Tiny bilateral pleural effusions cannot be excluded. No pneumothorax. IMPRESSION: 1. Interim extubation and removal of NG tube. 2. Stable cardiomegaly. 3. Diffuse bilateral interstitial prominence again noted without interim change. Persistent right base atelectasis. Tiny bilateral pleural effusions. Electronically Signed   By: Marcello Moores  Register   On: 04/08/2020 05:57   DG Chest Port 1 View  Result Date: 04/07/2020 CLINICAL DATA:  ARDS EXAM: PORTABLE CHEST 1 VIEW COMPARISON:  04/06/2020 FINDINGS: Cardiac shadow is stable. Endotracheal tube and gastric catheter are again seen and stable. Lungs are well aerated bilaterally. Patchy right basilar airspace opacity is again noted stable from the prior study. Mild interstitial changes are seen IMPRESSION: Patchy right basilar airspace opacity. Tubes and lines as described. Electronically Signed   By: Inez Catalina M.D.   On: 04/07/2020 10:17   DG Chest Port 1 View  Result Date: 04/05/2020 CLINICAL DATA:  Status post intubation. EXAM: PORTABLE CHEST 1 VIEW COMPARISON:  April 05, 2020 (9:53 p.m.) FINDINGS: An endotracheal tube is seen with its distal tip approximately 6.2 cm from the carina. A nasogastric tube is noted with its distal end extending below the level of the diaphragm. Stable diffusely increased interstitial lung markings are seen. Mild scarring is again noted within the right lung base. There is no evidence of a pleural effusion or pneumothorax.  The heart size and mediastinal contours are within normal limits. The visualized skeletal structures are unremarkable. IMPRESSION: 1. Interval endotracheal tube and nasogastric tube placement and positioning, as described above. Electronically Signed   By: Virgina Norfolk M.D.   On: 04/05/2020 23:03   DG Chest Port 1 View  Result Date: 04/05/2020 CLINICAL DATA:  COPD, history of melanoma, tobacco abuse EXAM: PORTABLE CHEST 1 VIEW COMPARISON:  08/08/2019 FINDINGS: 2 frontal views of the chest demonstrate a stable cardiac silhouette. Background emphysema again noted, with right basilar scarring. Increased interstitial prominence with mild central vascular congestion and small right pleural effusion. No pneumothorax. No acute bony abnormalities. IMPRESSION: 1.  Mild interstitial edema superimposed upon background emphysema. Electronically Signed   By: Randa Ngo M.D.   On: 04/05/2020 22:09   ECHOCARDIOGRAM COMPLETE  Result Date: 04/09/2020    ECHOCARDIOGRAM REPORT   Patient Name:   Carlos Vaughn Date of Exam: 04/09/2020 Medical Rec #:  [id number]         Height:       68.0 in Accession #:    [id number]        Weight:       139.3 lb Date of Birth:  04-12-1946         BSA:          1.753 m Patient Age:    70 years          BP:           128/51 mmHg Patient Gender: M                 HR:           77 bpm. Exam Location:  Inpatient Procedure: 2D Echo, Cardiac Doppler and Color Doppler Indications:    Atrial fibrillation  History:        Patient has prior history of Echocardiogram examinations, most                 recent 03/16/2019. COPD, Stroke and DVT,                 Signs/Symptoms:Shortness of Breath; Risk Factors:Current Smoker.                 GOLD COPD, resp. arrest, myleofibrosis, anemia.  Sonographer:    Dustin Flock Referring Phys: [phone] MICHAEL B WERT  Sonographer Comments: Image acquisition challenging due to COPD and Image acquisition challenging due to respiratory motion. IMPRESSIONS  1.  Left ventricular ejection fraction, by estimation, is 60 to 65%. The left ventricle has normal function. Left ventricular endocardial border not optimally defined to evaluate regional wall motion. Left ventricular diastolic parameters are consistent with Grade I diastolic dysfunction (impaired relaxation).  2. Right ventricular systolic function is normal. The right ventricular size is normal. Tricuspid regurgitation signal is inadequate for assessing PA pressure.  3. Left atrial size was mildly dilated.  4. The mitral valve is degenerative. No evidence of mitral valve regurgitation. Mild mitral stenosis. The mean mitral valve gradient is 4.3 mmHg with average heart rate of 78 bpm. Moderate mitral annular calcification.  5. The aortic valve is tricuspid. There is mild calcification of the aortic valve. There is mild thickening of the aortic valve. Aortic valve regurgitation is not visualized. Mild to moderate aortic valve sclerosis/calcification is present, without any evidence of aortic stenosis.  6. The inferior vena cava is normal in size with greater than 50% respiratory variability, suggesting right atrial pressure of 3 mmHg. Comparison(s): No significant change from prior study. FINDINGS  Left Ventricle: Left ventricular ejection fraction, by estimation, is 60 to 65%. The left ventricle has normal function. Left ventricular endocardial border not optimally defined to evaluate regional wall motion. The left ventricular internal cavity size was normal in size. There is no left ventricular hypertrophy. Left ventricular diastolic parameters are consistent with Grade I diastolic dysfunction (impaired relaxation). Right Ventricle: The right ventricular size is normal. No increase in right ventricular wall thickness. Right ventricular systolic function is normal. Tricuspid regurgitation signal is inadequate for assessing PA pressure. Left Atrium: Left atrial size was mildly dilated. Right Atrium: Right atrial size  was not well  visualized. Pericardium: Trivial pericardial effusion is present. Mitral Valve: The mitral valve is degenerative in appearance. Moderate mitral annular calcification. No evidence of mitral valve regurgitation. Mild mitral valve stenosis. The mean mitral valve gradient is 4.3 mmHg with average heart rate of 78 bpm. Tricuspid Valve: The tricuspid valve is grossly normal. Tricuspid valve regurgitation is trivial. No evidence of tricuspid stenosis. Aortic Valve: The aortic valve is tricuspid. There is mild calcification of the aortic valve. There is mild thickening of the aortic valve. Aortic valve regurgitation is not visualized. Mild to moderate aortic valve sclerosis/calcification is present, without any evidence of aortic stenosis. Pulmonic Valve: The pulmonic valve was grossly normal. Pulmonic valve regurgitation is not visualized. No evidence of pulmonic stenosis. Aorta: The aortic root is normal in size and structure. Venous: The inferior vena cava is normal in size with greater than 50% respiratory variability, suggesting right atrial pressure of 3 mmHg. IAS/Shunts: The interatrial septum was not well visualized.  LEFT VENTRICLE PLAX 2D LVIDd:         4.60 cm  Diastology LVIDs:         3.90 cm  LV e' medial:    10.70 cm/s LV PW:         1.10 cm  LV E/e' medial:  8.9 LV IVS:        1.10 cm  LV e' lateral:   17.20 cm/s LVOT diam:     2.40 cm  LV E/e' lateral: 5.5 LV SV:         119 LV SV Index:   68 LVOT Area:     4.52 cm  RIGHT VENTRICLE RV Basal diam:  2.90 cm RV S prime:     19.00 cm/s TAPSE (M-mode): 1.9 cm LEFT ATRIUM             Index       RIGHT ATRIUM           Index LA diam:        4.80 cm 2.74 cm/m  RA Area:     19.30 cm LA Vol (A2C):   39.6 ml 22.59 ml/m RA Volume:   52.00 ml  29.67 ml/m LA Vol (A4C):   62.3 ml 35.55 ml/m LA Biplane Vol: 49.8 ml 28.41 ml/m  AORTIC VALVE LVOT Vmax:   127.00 cm/s LVOT Vmean:  72.000 cm/s LVOT VTI:    0.263 m  AORTA Ao Root diam: 3.10 cm MITRAL VALVE MV  Area (PHT): 2.62 cm     SHUNTS MV Area VTI:   2.74 cm     Systemic VTI:  0.26 m MV Mean grad:  4.3 mmHg     Systemic Diam: 2.40 cm MV VTI:        0.44 m MV Decel Time: 289 msec MV E velocity: 95.20 cm/s MV A velocity: 127.00 cm/s MV E/A ratio:  0.75 Carlos Chiquito MD Electronically signed by Carlos Chiquito MD Signature Date/Time: 04/09/2020/3:34:37 PM    Final     Lab Results:  CBC    Component Value Date/Time   WBC 40.4 (H) 04/10/2020 0603   RBC 2.54 (L) 04/10/2020 0603   HGB 7.7 (L) 04/10/2020 0603   HCT 25.1 (L) 04/10/2020 0603   PLT 9 (LL) 04/10/2020 0603   MCV 98.8 04/10/2020 0603   MCH 30.3 04/10/2020 0603   MCHC 30.7 04/10/2020 0603   RDW 20.8 (H) 04/10/2020 0603   LYMPHSABS 3.0 04/07/2020 1320   MONOABS 4.9 (H) 04/07/2020 1320   EOSABS 0.0  04/07/2020 1320   BASOSABS 7.9 (H) 04/07/2020 1320    BMET    Component Value Date/Time   NA 139 04/10/2020 0603   K 3.3 (L) 04/10/2020 0603   CL 104 04/10/2020 0603   CO2 28 04/10/2020 0603   GLUCOSE 85 04/10/2020 0603   BUN 51 (H) 04/10/2020 0603   CREATININE 0.80 04/10/2020 0603   CALCIUM 8.6 (L) 04/10/2020 0603   GFRNONAA >60 04/10/2020 0603   GFRAA >60 08/07/2019 2240    BNP    Component Value Date/Time   BNP 559.3 (H) 04/07/2020 1029    ProBNP No results found for: PROBNP  Specialty Problems      Pulmonary Problems   COPD exacerbation (HCC)   COPD GOLD 2/ Group B      Active smoker  - 06/21/2019   try stiolto x 4 weeks samples then return to re-eval  - 06/21/2019   Walked RA x two laps =  approx 533f @ avg pace - stopped due to end of study  with sats of 97% % at the end of the study. - 07/30/2019  After extensive coaching inhaler device,  effectiveness =    90% with smi > continue stiolto  - 08/14/2019 Frequent ED visit in April for SOB. Add ipratropium-albuterol neb. Cont stiolto. CTA neg. BNP normal.   - PFT's 10/29/19  FEV1 1.60 (57 % ) ratio 0.58  p 9 % improvement from saba p Stiolto prior to study with DLCO   10.70 (45%) corrects to 2.37 (59%)  for alv volume and FV curve classic concavity         SOB (shortness of breath)    Echo 03/16/2019 2D echo with normal EF, grade 1 diastolic dysfunction and normal right ventricular failure or pulmonary hypertension. -  08/30/2019   Walked RA  2 laps @ approx 2552feach @ moderate pace  stopped due to end of study / sob but sats 99%       Chronic respiratory failure with hypoxia (HCC)   Acute pulmonary edema (HCC)      Allergies  Allergen Reactions  . Oxycodone Itching    Immunization History  Administered Date(s) Administered  . Fluad Quad(high Dose 65+) 01/19/2019  . Moderna Sars-Covid-2 Vaccination 05/16/2019, 06/13/2019, 12/19/2019    Past Medical History:  Diagnosis Date  . Anxiety   . COPD (chronic obstructive pulmonary disease) ([facility]  . Depression   . DVT (deep venous thrombosis) ([facility]  . Hyperlipemia   . Melanoma ([facility] [place]  . Myelofibrosis ([facility]  . PTSD (post-traumatic stress disorder)     Tobacco History: Social History   Tobacco Use  Smoking Status Former Smoker  . Packs/day: 0.50  . Years: 40.00  . Pack years: 20.00  . Types: Cigarettes  . Quit date: 01/05/2020  . Years since quitting: 0.3  Smokeless Tobacco Never Used   Counseling given: Not Answered   Continue to not smoke  Outpatient Encounter Medications as of 04/28/2020  Medication Sig  . albuterol (PROVENTIL) (2.5 MG/3ML) 0.083% nebulizer solution Take 3 mLs (2.5 mg total) by nebulization every 6 (six) hours as needed for wheezing or shortness of breath.  . Marland Kitchenlbuterol (VENTOLIN HFA) 108 (90 Base) MCG/ACT inhaler Inhale 1-2 puffs into the lungs every 6 (six) hours as needed for wheezing or shortness of breath. (Patient taking differently: Inhale 1-2 puffs into the lungs 2 (two) times daily.)  . allopurinol (ZYLOPRIM) 300 MG tablet Take 300 mg by mouth daily.  .[name]  CALCIUM-MAGNESIUM-ZINC PO Take 1 tablet by mouth daily.  . Cholecalciferol (VITAMIN D3) 25 MCG  (1000 UT) CHEW Chew 1,000 Units by mouth daily.  . furosemide (LASIX) 40 MG tablet Take 40 mg by mouth daily as needed for fluid or edema.  [name] levothyroxine (SYNTHROID) 75 MCG tablet Take 75 mcg by mouth daily before breakfast.  . Multiple Vitamin (MULTIVITAMIN) tablet Take 1 tablet by mouth daily.  . Potassium 99 MG TABS Take 1 tablet by mouth daily.  . prazosin (MINIPRESS) 2 MG capsule Take 2 mg by mouth at bedtime.   . thiamine 100 MG tablet Take 100 mg by mouth daily.  . Tiotropium Bromide-Olodaterol (STIOLTO RESPIMAT) 2.5-2.5 MCG/ACT AERS Inhale 2 puffs into the lungs daily.  . traZODone (DESYREL) 150 MG tablet Take 150 mg by mouth at bedtime.  . traMADol (ULTRAM) 50 MG tablet Take 50 mg by mouth at bedtime. (Patient not taking: Reported on 04/28/2020)   No facility-administered encounter medications on file as of 04/28/2020.     Review of Systems  Review of Systems  Constitutional: Positive for fatigue. Negative for activity change, diaphoresis and fever.  HENT: Positive for congestion. Negative for postnasal drip, sneezing, sore throat and trouble swallowing.   Eyes: Negative.   Respiratory: Positive for cough (productive ), shortness of breath and wheezing.   Cardiovascular: Positive for leg swelling. Negative for chest pain and palpitations.  Gastrointestinal: Negative for diarrhea, nausea and vomiting.  Endocrine: Negative.   Genitourinary: Negative.   Musculoskeletal: Negative.  Negative for arthralgias.  Skin: Negative.   Allergic/Immunologic: Negative.   Neurological: Negative.   Hematological: Negative.   Psychiatric/Behavioral: Negative.  Negative for dysphoric mood. The patient is not nervous/anxious.      Physical Exam  BP 108/60 (BP Location: Left Arm, Cuff Size: Normal)   Pulse 79   Temp 98.4 F (36.9 C) (Temporal)   Ht '5\' 8"'  (1.727 m)   Wt 142 lb 3.2 oz (64.5 kg)   SpO2 92%   BMI 21.62 kg/m   Wt Readings from Last 5 Encounters:  04/28/20 142 lb 3.2 oz  (64.5 kg)  04/10/20 141 lb 15.6 oz (64.4 kg)  01/16/20 141 lb (64 kg)  08/30/19 151 lb 3.2 oz (68.6 kg)  07/30/19 153 lb 9.6 oz (69.7 kg)    BMI Readings from Last 5 Encounters:  04/28/20 21.62 kg/m  04/10/20 21.59 kg/m  01/16/20 21.44 kg/m  08/30/19 22.99 kg/m  07/30/19 23.35 kg/m     Physical Exam Vitals and nursing note reviewed.  Constitutional:      General: Carlos Vaughn is not in acute distress.    Appearance: Normal appearance. Carlos Vaughn is normal weight.     Comments: Chronically ill adult male  HENT:     Head: Normocephalic and atraumatic.     Right Ear: Hearing and external ear normal.     Left Ear: Hearing and external ear normal.     Nose: Nose normal. No mucosal edema or rhinorrhea.     Right Turbinates: Not enlarged.     Left Turbinates: Not enlarged.     Mouth/Throat:     Mouth: Mucous membranes are dry.     Pharynx: Oropharynx is clear. No oropharyngeal exudate.  Eyes:     Pupils: Pupils are equal, round, and reactive to light.  Cardiovascular:     Rate and Rhythm: Normal rate and regular rhythm.     Pulses: Normal pulses.     Heart sounds: Normal heart sounds. No  murmur heard.   Pulmonary:     Effort: Pulmonary effort is normal.     Breath sounds: No decreased breath sounds, wheezing or rales.     Comments: Diminished breath sounds that exam Musculoskeletal:     Cervical back: Normal range of motion.     Right lower leg: No edema.     Left lower leg: No edema.  Lymphadenopathy:     Cervical: No cervical adenopathy.  Skin:    General: Skin is warm and dry.     Capillary Refill: Capillary refill takes less than 2 seconds.     Findings: No erythema or rash.  Neurological:     General: No focal deficit present.     Mental Status: Carlos Vaughn is alert and oriented to person, place, and time.     Motor: No weakness.     Coordination: Coordination normal.     Gait: Gait is intact. Gait normal.  Psychiatric:        Mood and  Affect: Mood normal.        Behavior: Behavior normal. Behavior is cooperative.        Thought Content: Thought content normal.        Judgment: Judgment normal.       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 Melvyn Novas.   Lauraine Rinne, NP 04/28/2020   This appointment required 32 minutes of patient care (this includes precharting, chart review, review of results, face-to-face care, etc.).

## 2020-04-28 NOTE — Assessment & Plan Note (Signed)
Plan: Continue not smoke 

## 2020-04-28 NOTE — Patient Instructions (Addendum)
You were seen today by Lauraine Rinne, NP  for:   1. COPD GOLD 2/ Group B    Stiolto Respimat inhaler >>>2 puffs daily >>>Take this no matter what >>>This is not a rescue inhaler  Only use your albuterol as a rescue medication to be used if you can't catch your breath by resting or doing a relaxed purse lip breathing pattern.  - The less you use it, the better it will work when you need it. - Ok to use up to 2 puffs  every 4 hours if you must but call for immediate appointment if use goes up over your usual need - Don't leave home without it !!  (think of it like the spare tire for your car)   Note your daily symptoms > remember "red flags" for COPD:   >>>Increase in cough >>>increase in sputum production >>>increase in shortness of breath or activity  intolerance.   If you notice these symptoms, please call the office to be seen.    2. Chronic respiratory failure with hypoxia (HCC)  Walk today in office for POC  Continue 2 L of O2 at night  Continue oxygen therapy as prescribed  >>>maintain oxygen saturations greater than 88 percent  >>>if unable to maintain oxygen saturations please contact the office  >>>do not smoke with oxygen  >>>can use nasal saline gel or nasal saline rinses to moisturize nose if oxygen causes dryness  3. Former smoker  Continue not smoke  4. Myelofibrosis ([place])  Continue to follow-up with hematology    Follow Up:    Return in about 4 weeks (around 05/26/2020), or if symptoms worsen or fail to improve, for Follow up with Dr. Melvyn Novas.   Notification of test results are managed in the following manner: If there are  any recommendations or changes to the  plan of care discussed in office today,  we will contact you and let you know what they are. If you do not hear from [place], then your results are normal and you can view them through your  MyChart account , or a letter will be sent to you. Thank you again for trusting [place] with your care  - Thank you,  [facility]    It is flu season:   >>> Best ways to protect herself from the flu: Receive the yearly flu vaccine, practice good hand hygiene washing with soap and also using hand sanitizer when available, eat a nutritious meals, get adequate rest, hydrate appropriately       Please contact the office if your symptoms worsen or you have concerns that you are not improving.   Thank you for choosing [facility] for your healthcare, and for allowing [place] to partner with you on your healthcare journey. I am thankful to be able to provide care to you today.   Wyn Quaker FNP-C

## 2020-04-28 NOTE — Assessment & Plan Note (Addendum)
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

## 2020-04-28 NOTE — Assessment & Plan Note (Signed)
Plan: Continue to follow-up with [facility] hematology

## 2020-06-02 ENCOUNTER — Ambulatory Visit: Payer: No Typology Code available for payment source | Admitting: Internal Medicine

## 2020-09-25 ENCOUNTER — Telehealth: Payer: Self-pay | Admitting: Internal Medicine

## 2020-09-25 ENCOUNTER — Other Ambulatory Visit: Payer: Self-pay

## 2020-09-25 NOTE — Telephone Encounter (Signed)
Patient's wife [name] [facility]) returned call, she states that the [employer] needs a new order for portable oxygen to refill the tanks.  She states that he has a concentrator that he uses 2L at HS.  The last time he was in the office in January he did not drop his sats to qualify for oxygen.  When he is sitting still in the [facility] his sats are in the 90's.  When he walks she states he drops to 88% and she puts him on the oxygen because it scares her.  She states his breathing is worse and at times he is wheezing.  He is still using his Stiolto, however, he is using his albuterol 4 times daily and is using 3 puffs instead of 2 puffs.  She said he is very weak and when he stands up his legs shake and that he does not get up and walk much anymore and is in the [facility] most of the time when they go out.  She said he is sob with exertion and it is worse than when he was last in the office.  He is not scheduled to come back to the office until September.  Advised that he needs an OV since his condition seems to have worsened and he may qualify for oxygen now and would need a walk in the office to evaluate for that need.  She verbalized understanding.  Patient scheduled to see BW on Monday at 10:30 am, advised to arrive by 10:15 for check in.  She states the he has not had a CXR since he was last hospitalized.  Nothing further needed at this time.

## 2020-09-25 NOTE — Telephone Encounter (Signed)
ATC x1, LVM to return call.

## 2020-09-29 ENCOUNTER — Ambulatory Visit (INDEPENDENT_AMBULATORY_CARE_PROVIDER_SITE_OTHER): Payer: Medicare PPO | Admitting: Primary Care

## 2020-09-29 ENCOUNTER — Telehealth: Payer: Self-pay | Admitting: Primary Care

## 2020-09-29 ENCOUNTER — Encounter: Payer: Self-pay | Admitting: Primary Care

## 2020-09-29 ENCOUNTER — Other Ambulatory Visit: Payer: Self-pay

## 2020-09-29 VITALS — BP 110/48 | HR 79 | Temp 98.2°F | Ht 68.0 in | Wt 136.6 lb

## 2020-09-29 DIAGNOSIS — J441 Chronic obstructive pulmonary disease with (acute) exacerbation: Secondary | ICD-10-CM | POA: Diagnosis not present

## 2020-09-29 DIAGNOSIS — J9611 Chronic respiratory failure with hypoxia: Secondary | ICD-10-CM | POA: Diagnosis not present

## 2020-09-29 DIAGNOSIS — D7581 Myelofibrosis: Secondary | ICD-10-CM | POA: Diagnosis not present

## 2020-09-29 DIAGNOSIS — J449 Chronic obstructive pulmonary disease, unspecified: Secondary | ICD-10-CM

## 2020-09-29 NOTE — Telephone Encounter (Signed)
Called and spoke with pts wife and she stated the insurance is not going to cover the oxygen without documentation that he did drop to 81% on room air.  This was added to the pt care coordination note.  Will route to the [facility] so this can be sent over to [facility] .   Thanks

## 2020-09-29 NOTE — Assessment & Plan Note (Addendum)
-   Shortness of breath is some worse. He has a chronic dry cough with intermittent wheeze. Clinically his COPD does not appear acutely exacerbated. Lungs were clear on exam today without wheezing or rhonchi.  - Continue Stiolto respimat 2 PUFFS once daily and prn albuterol hfa q6 hours for breakthrough shortness of breath/wheezing  - Declined routine CXR today

## 2020-09-29 NOTE — Assessment & Plan Note (Signed)
-   O2 81% RA on exertion, requiring 4L oxygen - Patient declined ambulatory walk test today to assess potential for POC - Renew oxygen order with [employer]

## 2020-09-29 NOTE — Patient Instructions (Addendum)
Recommend: - Continue Stiolto two puffs once daily in morning - Use either rescue inhaler or nebulizer for breakthrough shortness of breath/chest tightness/wheezing  - Discussing palliative care and code states with your hematologist next week   Orders: - Re-new oxygen order at 4L with [employer]   Follow-up: Either July 27, 28, 29th with Dr. Melvyn Novas (bring all medications in hand)

## 2020-09-29 NOTE — Progress Notes (Addendum)
@Patient  ID: Carlos Vaughn, adult    DOB: 08-18-1945, 75 y.o.   MRN: [id number]  Chief Complaint  Patient presents with   Follow-up    Sob, coughing, wheezing, night sweats no fever.    Referring provider: Caralyn Guile, DO  HPI: 75 year old, former smoker. PMH significant for COPD GOLD 2, chronic respiratory failure with hypoxia, myelofibrosis, seizure, PTSD, thrombocytopenia. Patient of Dr. Melvyn Novas, last seen on 04/28/20. Maintained on Stiolto.   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 Patient is chronically on 3L oxygen at ngiht and with exertion. He was requiring 4L oxygen while admitted in April. Hypoxia felt to be from volume overload d/t blood transfusion. He was diuresed with lasix.   Shortness of breath is some worse. He has a chronic dry cough with intermittent wheeze. He can not go without oxygen at home. He has oxygen concentrator at home but needs refill of potable tanks. He is wearing 4L oxygen at night. He reports symptoms of shortness of breath, wheezing and night sweats. He is getting blood transfusion twice a week. He gets lasix IV lasix on transfusion days and he is on 40mg  oral lasix at home. Hgb was 5.7 on June 10th, his last transfusion was Friday 09/26/20. He will be seeing hematology next week.    Allergies  Allergen Reactions   Oxycodone Itching    Immunization History  Administered Date(s) Administered   Fluad Quad(high Dose 65+) 01/19/2019   Moderna Sars-Covid-2 Vaccination 05/16/2019, 06/13/2019, 12/19/2019    Past Medical History:  Diagnosis Date   Anxiety    COPD (chronic obstructive pulmonary disease) (HCC)    Depression    DVT (deep venous thrombosis) (HCC)    Hyperlipemia    Melanoma ([place])    Myelofibrosis (HCC)    PTSD (post-traumatic stress disorder)     Tobacco History: Social History   Tobacco Use  Smoking Status Former    Packs/day: 0.50   Years: 40.00   Pack years: 20.00   Types: Cigarettes   Quit date: 01/05/2020   Years since quitting: 0.7  Smokeless Tobacco Never   Counseling given: Not Answered   Outpatient Medications Prior to Visit  Medication Sig Dispense Refill   albuterol (PROVENTIL) (2.5 MG/3ML) 0.083% nebulizer solution Take 3 mLs (2.5 mg total) by nebulization every 6 (six) hours as needed for wheezing or shortness of breath. 75 mL 12   albuterol (VENTOLIN HFA) 108 (90 Base) MCG/ACT inhaler Inhale 1-2 puffs into the lungs every 6 (six) hours as needed for wheezing or shortness of breath. (Patient taking differently: Inhale 1-2 puffs into the lungs 2 (two) times daily.) 18 g 6   allopurinol (ZYLOPRIM) 300 MG tablet Take 300 mg by mouth daily.     CALCIUM-MAGNESIUM-ZINC PO Take 1 tablet by mouth daily.     Cholecalciferol (VITAMIN D3) 25 MCG (1000 UT) CHEW Chew 1,000 Units by mouth daily.     furosemide (LASIX) 40 MG tablet Take 40 mg by mouth daily as needed for fluid or edema.     levothyroxine (SYNTHROID) 75 MCG tablet Take 75 mcg by mouth daily before breakfast.     Multiple Vitamin (MULTIVITAMIN) tablet Take 1 tablet by mouth daily.     Potassium 99 MG TABS Take 1 tablet by mouth daily.     prazosin (MINIPRESS) 2 MG capsule Take 2 mg by mouth at bedtime.      thiamine 100  MG tablet Take 100 mg by mouth daily.     Tiotropium Bromide-Olodaterol (STIOLTO RESPIMAT) 2.5-2.5 MCG/ACT AERS Inhale 2 puffs into the lungs daily.     traMADol (ULTRAM) 50 MG tablet Take 50 mg by mouth at bedtime. (Patient not taking: Reported on 04/28/2020)     traZODone (DESYREL) 150 MG tablet Take 150 mg by mouth at bedtime.     No facility-administered medications prior to visit.    Review of Systems  Review of Systems  Constitutional:  Positive for fatigue.  Respiratory:  Positive for cough and shortness of breath. Negative for chest tightness.     Physical Exam  BP (!) 110/48 (BP Location: Left Arm,  Patient Position: Sitting, Cuff Size: Normal)   Pulse 79   Temp 98.2 F (36.8 C) (Temporal)   Ht 5\' 8"  (1.727 m)   Wt 136 lb 9.6 oz (62 kg)   SpO2 94%   BMI 20.77 kg/m  Physical Exam Constitutional:      Appearance: Normal appearance.  HENT:     Head: Normocephalic and atraumatic.     Mouth/Throat:     Comments: Deferred d/t masking Cardiovascular:     Rate and Rhythm: Normal rate and regular rhythm.  Pulmonary:     Breath sounds: No wheezing, rhonchi or rales.  Musculoskeletal:     Comments: In [facility]  Neurological:     General: No focal deficit present.     Mental Status: Jahari Billy is alert and oriented to person, place, and time. Mental status is at baseline.  Psychiatric:        Mood and Affect: Mood normal.        Behavior: Behavior normal.        Thought Content: Thought content normal.        Judgment: Judgment normal.     Lab Results:  CBC    Component Value Date/Time   WBC 40.4 (H) 04/10/2020 0603   RBC 2.54 (L) 04/10/2020 0603   HGB 7.7 (L) 04/10/2020 0603   HCT 25.1 (L) 04/10/2020 0603   PLT 9 (LL) 04/10/2020 0603   MCV 98.8 04/10/2020 0603   MCH 30.3 04/10/2020 0603   MCHC 30.7 04/10/2020 0603   RDW 20.8 (H) 04/10/2020 0603   LYMPHSABS 3.0 04/07/2020 1320   MONOABS 4.9 (H) 04/07/2020 1320   EOSABS 0.0 04/07/2020 1320   BASOSABS 7.9 (H) 04/07/2020 1320    BMET    Component Value Date/Time   NA 139 04/10/2020 0603   K 3.3 (L) 04/10/2020 0603   CL 104 04/10/2020 0603   CO2 28 04/10/2020 0603   GLUCOSE 85 04/10/2020 0603   BUN 51 (H) 04/10/2020 0603   CREATININE 0.80 04/10/2020 0603   CALCIUM 8.6 (L) 04/10/2020 0603   GFRNONAA >60 04/10/2020 0603   GFRAA >60 08/07/2019 2240    BNP    Component Value Date/Time   BNP 559.3 (H) 04/07/2020 1029    ProBNP No results found for: PROBNP  Imaging: No results found.   Assessment & Plan:   COPD GOLD 2/ Group B   - Shortness of breath is some worse. He has a chronic dry cough with  intermittent wheeze. Clinically his COPD does not appear acutely exacerbated. Lungs were clear on exam today without wheezing or rhonchi.  - Continue Stiolto respimat 2 PUFFS once daily and prn albuterol hfa q6 hours for breakthrough shortness of breath/wheezing  - Declined routine CXR today   Chronic respiratory failure with hypoxia (HCC) -  O2 81% RA on exertion, requiring 4L oxygen - Patient declined ambulatory walk test today to assess potential for POC - Renew oxygen order with [employer]   Myelofibrosis ([place]) -Myofibrosis due to increasing blast in peripheral blood and progressive worsening fatigue. He underwent bone marrow bx in April which was negative for excess blasts. Continues fedratinib and danazole. Echocardiogram in December 2021 was inadequate for assessing PA pressure. He tells me that he is open to discussing palliative care, encourage he speak with his hem/onc at his follow-up next week.     Martyn Ehrich, NP 09/30/2020

## 2020-09-30 ENCOUNTER — Encounter: Payer: Self-pay | Admitting: Primary Care

## 2020-09-30 NOTE — Assessment & Plan Note (Addendum)
-  Myofibrosis due to increasing blast in peripheral blood and progressive worsening fatigue. He underwent bone marrow bx in April which was negative for excess blasts. Continues fedratinib and danazole. Echocardiogram in December 2021 was inadequate for assessing PA pressure. He tells me that he is open to discussing palliative care, encourage he speak with his hem/onc at his follow-up next week.

## 2020-10-01 NOTE — Telephone Encounter (Signed)
I have faxed the information to [facility]

## 2020-10-02 NOTE — Telephone Encounter (Signed)
I have spoke with Pam from [facility] and she has confirmed that they do have this 02 order. Pam stated that she has also spoke with the patient and due to his liter flow usage he doesn't qualify for POC

## 2020-10-17 DEATH — deceased

## 2020-11-12 ENCOUNTER — Ambulatory Visit: Payer: Medicare PPO | Admitting: Internal Medicine

## 2021-01-15 ENCOUNTER — Ambulatory Visit: Payer: No Typology Code available for payment source | Admitting: Internal Medicine

## 2022-05-12 ENCOUNTER — Other Ambulatory Visit (HOSPITAL_BASED_OUTPATIENT_CLINIC_OR_DEPARTMENT_OTHER): Payer: Self-pay
